# Patient Record
Sex: Male | Born: 1950 | Race: White | Hispanic: No | Smoking: Current every day smoker
Health system: Southern US, Community
[De-identification: ages and names within clinical notes are randomized; demographics above are authoritative.]

## PROBLEM LIST (undated history)

## (undated) ENCOUNTER — Ambulatory Visit: Disposition: A | Discharge status: Home / Self Care | Payer: 59

## (undated) DIAGNOSIS — R7303 Prediabetes: Secondary | ICD-10-CM

## (undated) DIAGNOSIS — E785 Hyperlipidemia, unspecified: Secondary | ICD-10-CM

## (undated) DIAGNOSIS — N400 Enlarged prostate without lower urinary tract symptoms: Secondary | ICD-10-CM

## (undated) DIAGNOSIS — I10 Essential (primary) hypertension: Secondary | ICD-10-CM

## (undated) DIAGNOSIS — J189 Pneumonia, unspecified organism: Secondary | ICD-10-CM

## (undated) DIAGNOSIS — E291 Testicular hypofunction: Secondary | ICD-10-CM

## (undated) DIAGNOSIS — K219 Gastro-esophageal reflux disease without esophagitis: Secondary | ICD-10-CM

## (undated) DIAGNOSIS — I219 Acute myocardial infarction, unspecified: Secondary | ICD-10-CM

## (undated) DIAGNOSIS — R0600 Dyspnea, unspecified: Secondary | ICD-10-CM

## (undated) DIAGNOSIS — M1990 Unspecified osteoarthritis, unspecified site: Secondary | ICD-10-CM

## (undated) DIAGNOSIS — J449 Chronic obstructive pulmonary disease, unspecified: Secondary | ICD-10-CM

## (undated) DIAGNOSIS — E559 Vitamin D deficiency, unspecified: Secondary | ICD-10-CM

## (undated) HISTORY — DX: Benign prostatic hyperplasia without lower urinary tract symptoms: N40.0

## (undated) HISTORY — DX: Testicular hypofunction: E29.1

## (undated) HISTORY — DX: Prediabetes: R73.03

## (undated) HISTORY — DX: Acute myocardial infarction, unspecified: I21.9

## (undated) HISTORY — PX: VASECTOMY: SHX75

## (undated) HISTORY — DX: Chronic obstructive pulmonary disease, unspecified: J44.9

## (undated) HISTORY — DX: Hyperlipidemia, unspecified: E78.5

## (undated) HISTORY — DX: Essential (primary) hypertension: I10

## (undated) HISTORY — PX: PROSTATE SURGERY: SHX751

## (undated) HISTORY — DX: Gastro-esophageal reflux disease without esophagitis: K21.9

## (undated) HISTORY — DX: Vitamin D deficiency, unspecified: E55.9

---

## 1989-05-24 HISTORY — PX: AMPUTATION FINGER / THUMB: SUR24

## 1997-12-25 ENCOUNTER — Ambulatory Visit (HOSPITAL_COMMUNITY): Admission: RE | Admit: 1997-12-25 | Discharge: 1997-12-25 | Payer: Self-pay | Admitting: Urology

## 1999-09-02 ENCOUNTER — Ambulatory Visit (HOSPITAL_COMMUNITY): Admission: RE | Admit: 1999-09-02 | Discharge: 1999-09-02 | Payer: Self-pay | Admitting: Internal Medicine

## 1999-09-02 ENCOUNTER — Encounter: Payer: Self-pay | Admitting: Internal Medicine

## 2000-12-05 ENCOUNTER — Ambulatory Visit (HOSPITAL_COMMUNITY): Admission: RE | Admit: 2000-12-05 | Discharge: 2000-12-05 | Payer: Self-pay | Admitting: Internal Medicine

## 2000-12-05 ENCOUNTER — Encounter: Payer: Self-pay | Admitting: Internal Medicine

## 2001-02-06 ENCOUNTER — Other Ambulatory Visit: Admission: RE | Admit: 2001-02-06 | Discharge: 2001-02-06 | Payer: Self-pay | Admitting: Urology

## 2003-05-25 DIAGNOSIS — I219 Acute myocardial infarction, unspecified: Secondary | ICD-10-CM

## 2003-05-25 HISTORY — PX: CARDIAC CATHETERIZATION: SHX172

## 2003-05-25 HISTORY — DX: Acute myocardial infarction, unspecified: I21.9

## 2003-06-20 ENCOUNTER — Ambulatory Visit (HOSPITAL_COMMUNITY): Admission: RE | Admit: 2003-06-20 | Discharge: 2003-06-20 | Payer: Self-pay | Admitting: Internal Medicine

## 2004-02-13 ENCOUNTER — Ambulatory Visit (HOSPITAL_COMMUNITY): Admission: RE | Admit: 2004-02-13 | Discharge: 2004-02-13 | Payer: Self-pay | Admitting: Internal Medicine

## 2004-05-14 ENCOUNTER — Inpatient Hospital Stay (HOSPITAL_COMMUNITY): Admission: EM | Admit: 2004-05-14 | Discharge: 2004-05-16 | Payer: Self-pay | Admitting: Emergency Medicine

## 2005-07-20 ENCOUNTER — Ambulatory Visit (HOSPITAL_COMMUNITY): Admission: RE | Admit: 2005-07-20 | Discharge: 2005-07-20 | Payer: Self-pay | Admitting: Internal Medicine

## 2006-08-01 ENCOUNTER — Ambulatory Visit (HOSPITAL_COMMUNITY): Admission: RE | Admit: 2006-08-01 | Discharge: 2006-08-01 | Payer: Self-pay | Admitting: Internal Medicine

## 2007-09-06 ENCOUNTER — Ambulatory Visit (HOSPITAL_COMMUNITY): Admission: RE | Admit: 2007-09-06 | Discharge: 2007-09-06 | Payer: Self-pay | Admitting: Internal Medicine

## 2009-08-05 ENCOUNTER — Ambulatory Visit (HOSPITAL_COMMUNITY): Admission: RE | Admit: 2009-08-05 | Discharge: 2009-08-05 | Payer: Self-pay | Admitting: Internal Medicine

## 2009-12-08 ENCOUNTER — Ambulatory Visit (HOSPITAL_COMMUNITY): Admission: RE | Admit: 2009-12-08 | Discharge: 2009-12-08 | Payer: Self-pay | Admitting: Internal Medicine

## 2009-12-10 ENCOUNTER — Ambulatory Visit (HOSPITAL_COMMUNITY): Admission: RE | Admit: 2009-12-10 | Discharge: 2009-12-10 | Payer: Self-pay | Admitting: Internal Medicine

## 2010-10-09 NOTE — Discharge Summary (Signed)
[name], [name] NO.:  [id number]   MEDICAL RECORD NO.:  [id number]          PATIENT TYPE:  INP   LOCATION:  2013                         FACILITY:  [facility]   PHYSICIAN:  Michael [name], M.D.     DATE OF BIRTH:  10-25-1950   DATE OF ADMISSION:  05/14/2004  DATE OF DISCHARGE:  05/16/2004                                 DISCHARGE SUMMARY   ATTENDING PHYSICIAN:  Dr. Tresa [name].   DISCHARGE DIAGNOSES:  1.  Unstable angina pectoris, admitted to rule out myocardial infarction      protocol.  2.  Ruled out for myocardial infarction by serial enzymes and negative EKG      changes.  3.  Newly diagnosed coronary artery disease status post catheterization this      admission without intervention.  4.  Hyperlipidemia with elevated triglyceride level.  5.  Gastroesophageal reflux disease.  6.  Chronic obstructive pulmonary disease, early emphysema with ongoing      tobacco use.   This is a 60 year old Caucasian gentleman, a patient of Dr. Lucky [name]  who presented to the emergency room with complaints of chest pressure and  sensation like a weight sitting on his chest and shortness of breath.  He  denied any history of hypertension, diabetes.  There is no family history of  heart disease.  The patient himself is a smoker and has a history of treated  hyperlipidemia.   His enzymes showed elevated troponins 0.37 and 0.31, but CK and CK MB were  normal, and EKG did not really reveal any significant acute changes.   He was taken to the cath lab on May 14, 2004, by Dr. Tresa [name] and his cath  showed a totally occluded diagonal-2 branch of LAD, ejection fraction was  normal and there was some focal hypokinesis of apex and the anterior wall  secondary to diagonal branch occlusion.  The recommendations were to proceed  with medical therapy.  Patient was transferred to the telemetry unit.  He  tolerated procedure well, did not have any complications and was considered  for  __________ because of his elevated troponin.  He was held in the  hospital for observation and was stable for discharge home on May 16, 2004.  Dr. Allyson [name] assessed the patient in the morning of May 16, 2004,  found him stable.  He was sent home on the following medications:  1.  Aspirin 81 mg daily.  2.  Toprol XL 50 mg daily.  3.  Altace 2.5 mg daily.  4.  Lipitor 40 mg daily.  5.  Plavix 75 mg daily.  6.  Zantac, to resume prior dose.  7.  Nitroglycerin 0.4 mg as needed.  8.  Nitro-Dur patch 25 mg daily 6 weeks then 14 mg daily for 2 weeks and      then 7 mg daily for 2 weeks.   ACTIVITIES:  No change.  No driving, no lifting greater than 3 pounds for  three days.   DISCHARGE DIET:  Low cholesterol, low fat diet.   DISCHARGE FOLLOWUP:  He will see Dr. Tresa [name] on May 28, 2004, at 11:45 and  we are going to give him a note to stay off work up until he sees Dr. Tresa [name]  in the office.       MK/MEDQ  D:  05/16/2004  T:  05/18/2004  Job:  [id number]   cc:   Michael [name], M.D.  [phone] [address]  [place], [place] [address]  Fax: [phone]

## 2010-10-09 NOTE — Cardiovascular Report (Signed)
NAMEELIASAR, HLAVATY NO.:  [id number]   MEDICAL RECORD NO.:  [id number]          PATIENT TYPE:  INP   LOCATION:  2013                         FACILITY:  [facility]   PHYSICIAN:  Nicki Guadalajara, M.D.     DATE OF BIRTH:  Jul 14, 1950   DATE OF PROCEDURE:  05/14/2004  DATE OF DISCHARGE:                              CARDIAC CATHETERIZATION   INDICATIONS:  Michael [name] is a 60 year old gentleman who has a 43-  year history of tobacco use, history of hyperlipidemia, and gastroesophageal  reflux disease symptoms.  Yesterday, he developed new onset exertional  substernal chest pressure.  He developed recurrent episodes of significant  chest pain yesterday which resolved each time with cessation of activity.  He again had recurrent chest pain last evening, at approximately 1 a.m. this  morning was awakened from sleep with severe substernal chest discomfort.  This morning he attempted to go back to work, but again, with minimal  walking developed significant substernal chest pressure compatible with  unstable angina.  He was seen at work and was given nitroglycerin and  aspirin and transferred to [facility] Emergency Room for further evaluation.   His ECG on arrival did not show any acute ST segment elevation.  However, in  light of his worrisome symptoms compatible with accelerated unstable angina,  urgent cardiac catheterization was recommended.   DESCRIPTION OF PROCEDURE:  After premedication with Valium 5 mg  intravenously, the patient was prepped and draped in the usual fashion.  His  right femoral artery was punctured anteriorly and a 6 French sheath was  inserted.  Diagnostic catheterization was done with 6 French Judkins 4 left  and right coronary catheters.  200 mcg of intracoronary nitroglycerin was  administered down the left coronary system.  Pigtail catheter was used for  biplane cine left ventriculography.  Distal aortography was also performed  in light of the  patient's risk factor profile.  After cine angiographic  review, it was felt that medical therapy would be the best way to treat his  occluded small second diagonal vessel and therefore catheters were removed.  Hemostasis was obtained by direct manual pressure.  The patient tolerated  the procedure well.   HEMODYNAMIC DATA:  Central aortic pressure was 140/78, left ventricular  pressure 140/6, post A wave 18.   ANGIOGRAPHIC DATA:  The left main coronary artery was angiographically  normal and bifurcated into an LAD and left circumflex system.   The LAD gave rise to a moderate size first diagonal vessel and moderate size  first septal perforating artery.  In the proximal to mid segment there was a  diminutive size second diagonal vessel which appeared totally occluded.  There was faint filling of a very small vessel in a retrograde fashion.  The  third diagonal vessel was very small.  A portion of the LAD after the third  diagonal vessel seemed to dip intramyocardially, but there was not any  significant muscle bridging or apparent spasm.  The remainder of the LAD was  angiographically normal and wrapped around the LV apex.   The circumflex vessel gave rise to  two marginal vessels and was free of  significant disease.   The right coronary artery was angiographically normal and gave rise to a PDA  and inferior LV branch, and ended in a small posterior lateral system.   Biplane cine left ventriculography revealed normal global LV contractility  with ejection fraction of at least 55-60%.  However, there was a very small  focal region of upper mid anterolateral hypocontractility seen on the RAO  projection.  On the LAO projection, contractility was normal.   Distal aortography did not demonstrate any renal artery stenosis.  There was  no significant aortoiliac disease.   IMPRESSION:  1.  Normal global left ventricular function with subtle focal, mild upper      mid anterolateral  hypocontractility.  2.  Total occlusion of a very small second diagonal branch of the left      anterior descending.  3.  Normal circumflex and normal right coronary arteries.   RECOMMENDATIONS:  Mr. Omdahl yesterday developed classic symptoms of  accelerated new onset angina pectoris/unstable angina.  Catheterization  today shows total occlusion of a very small second diagonal branch of his  LAD which is 1.5 to less than 2 mm in size.  There appeared to be very faint  filling of a very small mid portion of the diagonal vessel.  Medical therapy  will be recommended.  Smoking cessation will be strongly encouraged.  The  patient will be started on antiplatelet therapy in addition to aggressive  lipid lowering, beta blockade, and probable ACE inhibition.       TK/MEDQ  D:  05/14/2004  T:  05/15/2004  Job:  [id number]   cc:   Kirk Ruths, M.D.  [address]  [place]  [place] [address]  Fax: [phone]   Orville Govern

## 2012-03-23 ENCOUNTER — Other Ambulatory Visit: Payer: Self-pay | Admitting: Internal Medicine

## 2012-03-23 DIAGNOSIS — R911 Solitary pulmonary nodule: Secondary | ICD-10-CM

## 2012-03-29 ENCOUNTER — Ambulatory Visit
Admission: RE | Admit: 2012-03-29 | Discharge: 2012-03-29 | Disposition: A | Discharge status: Home / Self Care | Payer: 59 | Source: Ambulatory Visit | Attending: Internal Medicine | Admitting: Internal Medicine

## 2012-03-29 DIAGNOSIS — R911 Solitary pulmonary nodule: Secondary | ICD-10-CM

## 2012-03-29 MED ORDER — IOHEXOL 300 MG/ML  SOLN
75.0000 mL | Freq: Once | INTRAMUSCULAR | Status: AC | PRN
  Administered 2012-03-29: 75 mL via INTRAVENOUS

## 2012-10-22 HISTORY — PX: OTHER SURGICAL HISTORY: SHX169

## 2013-03-09 ENCOUNTER — Encounter: Payer: Self-pay | Admitting: Internal Medicine

## 2013-03-09 DIAGNOSIS — E559 Vitamin D deficiency, unspecified: Secondary | ICD-10-CM | POA: Insufficient documentation

## 2013-03-09 DIAGNOSIS — N138 Other obstructive and reflux uropathy: Secondary | ICD-10-CM | POA: Insufficient documentation

## 2013-03-09 DIAGNOSIS — J449 Chronic obstructive pulmonary disease, unspecified: Secondary | ICD-10-CM | POA: Insufficient documentation

## 2013-03-09 DIAGNOSIS — I10 Essential (primary) hypertension: Secondary | ICD-10-CM | POA: Insufficient documentation

## 2013-03-09 DIAGNOSIS — E782 Mixed hyperlipidemia: Secondary | ICD-10-CM | POA: Insufficient documentation

## 2013-03-28 ENCOUNTER — Ambulatory Visit: Payer: Self-pay | Admitting: Physician Assistant

## 2013-04-06 ENCOUNTER — Encounter: Payer: Self-pay | Admitting: Physician Assistant

## 2013-04-06 ENCOUNTER — Ambulatory Visit: Payer: 59 | Admitting: Physician Assistant

## 2013-04-06 VITALS — BP 120/70 | HR 60 | Temp 98.1°F | Resp 16 | Ht 72.25 in | Wt 164.0 lb

## 2013-04-06 DIAGNOSIS — I10 Essential (primary) hypertension: Secondary | ICD-10-CM

## 2013-04-06 DIAGNOSIS — Z79899 Other long term (current) drug therapy: Secondary | ICD-10-CM

## 2013-04-06 DIAGNOSIS — E559 Vitamin D deficiency, unspecified: Secondary | ICD-10-CM

## 2013-04-06 DIAGNOSIS — E785 Hyperlipidemia, unspecified: Secondary | ICD-10-CM

## 2013-04-06 LAB — CBC WITH DIFFERENTIAL/PLATELET
Basophils Absolute: 0.1 10*3/uL (ref 0.0–0.1)
Lymphocytes Relative: 29 % (ref 12–46)
Lymphs Abs: 2.7 10*3/uL (ref 0.7–4.0)
MCHC: 34.3 g/dL (ref 30.0–36.0)
Neutro Abs: 5.8 10*3/uL (ref 1.7–7.7)
Neutrophils Relative %: 61 % (ref 43–77)
RDW: 12.9 % (ref 11.5–15.5)

## 2013-04-06 LAB — LIPID PANEL
Cholesterol: 234 mg/dL — ABNORMAL HIGH (ref 0–200)
HDL: 48 mg/dL (ref >39)
LDL Cholesterol: 144 mg/dL — ABNORMAL HIGH (ref 0–99)
Triglycerides: 211 mg/dL — ABNORMAL HIGH (ref <150)
VLDL: 42 mg/dL — ABNORMAL HIGH (ref 0–40)

## 2013-04-06 LAB — HEPATIC FUNCTION PANEL
ALT: 16 U/L (ref 0–53)
Alkaline Phosphatase: 82 U/L (ref 39–117)
Total Bilirubin: 0.9 mg/dL (ref 0.3–1.2)

## 2013-04-06 LAB — BASIC METABOLIC PANEL WITH GFR
CO2: 29 mEq/L (ref 19–32)
Calcium: 9.7 mg/dL (ref 8.4–10.5)
Chloride: 102 mEq/L (ref 96–112)
GFR, Est Non African American: >89 mL/min
Sodium: 137 mEq/L (ref 135–145)

## 2013-04-06 LAB — TSH: TSH: 2.216 u[IU]/mL (ref 0.350–4.500)

## 2013-04-06 LAB — MAGNESIUM: Magnesium: 2.1 mg/dL (ref 1.5–2.5)

## 2013-04-06 NOTE — Progress Notes (Signed)
HPI Patient presents for 3 month follow up with hypertension, hyperlipidemia, prediabetes and vitamin D. Patient's blood pressure has been controlled at home. Patient denies chest pain, shortness of breath, dizziness.  Patient's cholesterol is diet controlled. The cholesterol last visit was  LDL 130 and with heart history goal is 70, patient did not start the crestor he was given last visit. Pending these labs he has agreed to start it.  Patient is on Vitamin D supplement.  Current Medications:       bisoprolol-hydrochlorothiazide 10-6.25 MG per tablet  Commonly known as:  ZIAC  Take 0.5 tablets by mouth daily.     cholecalciferol 1000 UNITS tablet  Commonly known as:  VITAMIN D  1,000 Units. 4000 total a day     DULERA 100-5 MCG/ACT Aero  Generic drug:  mometasone-formoterol  Inhale 2 puffs into the lungs 2 (two) times daily.     saw palmetto 500 MG capsule  Take 500 mg by mouth daily.       Medical History:  Past Medical History  Diagnosis Date  . GERD (gastroesophageal reflux disease)   . BPH (benign prostatic hyperplasia)   . Vitamin D deficiency   . Prediabetes   . Hypogonadism male   . COPD (chronic obstructive pulmonary disease)     via CT lung  . Hypertension   . Hyperlipidemia   . Myocardial infarction 2005    Per patient no stent or PTCA, just medical treatment   Allergies:  Allergies  Allergen Reactions  . Ace Inhibitors Cough  . Ciprofloxacin   . Fenofibrate     dizzy    ROS Constitutional: Denies fever, chills, weight loss/gain, headaches, insomnia, fatigue, night sweats, and change in appetite. Eyes: Denies redness, blurred vision, diplopia, discharge, itchy, watery eyes.  ENT: Denies discharge, congestion, post nasal drip, sore throat, earache, dental pain, Tinnitus, Vertigo, Sinus pain, snoring.  Cardio: Denies chest pain, palpitations, irregular heartbeat,  dyspnea, diaphoresis, orthopnea, PND, claudication, edema Respiratory: denies cough,  dyspnea,pleurisy, hoarseness, wheezing.  Gastrointestinal: Denies dysphagia, heartburn,  water brash, pain, cramps, nausea, vomiting, bloating, diarrhea, constipation, hematemesis, melena, hematochezia,  hemorrhoids Genitourinary: Denies dysuria, frequency, urgency, nocturia, hesitancy, discharge, hematuria, flank pain Musculoskeletal: Denies arthralgia, myalgia, stiffness, Jt. Swelling, pain, limp, and strain/sprain. Skin: Denies puritis, rash, hives, warts, acne, eczema, changing in skin lesion Neuro: Weakness, tremor, incoordination, spasms, paresthesia, pain Psychiatric: Denies confusion, memory loss, sensory loss Endocrine: Denies change in weight, skin, hair change, nocturia, and paresthesia, Diabetic Polys, visual blurring, hyper /hypo glycemic episodes.  Heme/Lymph: Excessive bleeding, bruising, enlarged lymph nodes  Family history- Review and unchanged Social history- Review and unchanged Physical Exam: Filed Vitals:   04/06/13 0850  BP: 120/70  Pulse: 60  Temp: 98.1 F (36.7 C)  Resp: 16   Filed Weights   04/06/13 0850  Weight: 164 lb (74.39 kg)   General Appearance: Well nourished, in no apparent distress. Eyes: PERRLA, EOMs, conjunctiva no swelling or erythema, normal fundi and vessels. Sinuses: No Frontal/maxillary tenderness ENT/Mouth: Ext aud canals clear, with TMs without erythema, bulging.No erythema, swelling, or exudate on post pharynx.  Tonsils not swollen or erythematous. Hearing normal.  Neck: Supple, thyroid normal.  Respiratory: Respiratory effort normal, BS equal bilaterally without rales, rhonci, wheezing or stridor.  Cardio: + 2/6 holosytolic murmur without rubs or gallops. Peripheral pulses brisk and equal bilaterally, without edema.  Abdomen: Flat, soft, with bowel sounds. Nontender, no guarding, rebound, hernias, masses, or organomegaly.  Lymphatics: Non tender without lymphadenopathy.  Musculoskeletal: Full  ROM all peripheral extremities, joint  stability, 5/5 strength, and normal gait. Skin: Warm, dry without rashes, lesions, ecchymosis.  Neuro: Cranial nerves intact, reflexes equal bilaterally. Normal muscle tone, no cerebellar symptoms. Sensation intact.  Pysch: Awake and oriented X 3, normal affect, Insight and Judgment appropriate.   Assessement and Plan:  Hypertension: Continue medication, monitor blood pressure at home. Continue DASH diet. Cholesterol: Continue diet and exercise. Check cholesterol and explained with his history of a stent our goal is 70. He will start the crestor, 1/4 pill 3 days a week.  Vitamin D Def- check level and continue medications.   Quentin Mulling 9:31 AM

## 2013-04-06 NOTE — Patient Instructions (Signed)

## 2013-04-07 LAB — VITAMIN D 25 HYDROXY (VIT D DEFICIENCY, FRACTURES): Vit D, 25-Hydroxy: 70 ng/mL (ref 30–89)

## 2013-06-29 ENCOUNTER — Ambulatory Visit (INDEPENDENT_AMBULATORY_CARE_PROVIDER_SITE_OTHER): Payer: 59 | Admitting: Internal Medicine

## 2013-06-29 ENCOUNTER — Encounter: Payer: Self-pay | Admitting: Internal Medicine

## 2013-06-29 VITALS — BP 132/72 | HR 60 | Temp 97.7°F | Resp 16 | Wt 160.4 lb

## 2013-06-29 DIAGNOSIS — R7309 Other abnormal glucose: Secondary | ICD-10-CM | POA: Insufficient documentation

## 2013-06-29 DIAGNOSIS — E785 Hyperlipidemia, unspecified: Secondary | ICD-10-CM

## 2013-06-29 DIAGNOSIS — Z79899 Other long term (current) drug therapy: Secondary | ICD-10-CM

## 2013-06-29 DIAGNOSIS — E559 Vitamin D deficiency, unspecified: Secondary | ICD-10-CM

## 2013-06-29 DIAGNOSIS — I10 Essential (primary) hypertension: Secondary | ICD-10-CM

## 2013-06-29 LAB — CBC WITH DIFFERENTIAL/PLATELET
BASOS ABS: 0.1 10*3/uL (ref 0.0–0.1)
Basophils Relative: 1 % (ref 0–1)
EOS PCT: 1 % (ref 0–5)
Eosinophils Absolute: 0.1 10*3/uL (ref 0.0–0.7)
HCT: 41.3 % (ref 39.0–52.0)
HEMOGLOBIN: 14.4 g/dL (ref 13.0–17.0)
LYMPHS ABS: 2.4 10*3/uL (ref 0.7–4.0)
Lymphocytes Relative: 29 % (ref 12–46)
MCH: 31.9 pg (ref 26.0–34.0)
MCHC: 34.9 g/dL (ref 30.0–36.0)
MCV: 91.6 fL (ref 78.0–100.0)
MONO ABS: 0.8 10*3/uL (ref 0.1–1.0)
Monocytes Relative: 9 % (ref 3–12)
NEUTROS ABS: 5 10*3/uL (ref 1.7–7.7)
Neutrophils Relative %: 60 % (ref 43–77)
PLATELETS: 119 10*3/uL — AB (ref 150–400)
RBC: 4.51 MIL/uL (ref 4.22–5.81)
RDW: 13.6 % (ref 11.5–15.5)
WBC: 8.4 10*3/uL (ref 4.0–10.5)

## 2013-06-29 LAB — HEPATIC FUNCTION PANEL
ALT: 19 U/L (ref 0–53)
AST: 28 U/L (ref 0–37)
Albumin: 3.9 g/dL (ref 3.5–5.2)
Alkaline Phosphatase: 82 U/L (ref 39–117)
BILIRUBIN DIRECT: 0.1 mg/dL (ref 0.0–0.3)
BILIRUBIN TOTAL: 1 mg/dL (ref 0.2–1.2)
Indirect Bilirubin: 0.9 mg/dL (ref 0.2–1.2)
TOTAL PROTEIN: 6.2 g/dL (ref 6.0–8.3)

## 2013-06-29 LAB — BASIC METABOLIC PANEL WITH GFR
BUN: 14 mg/dL (ref 6–23)
CHLORIDE: 104 meq/L (ref 96–112)
CO2: 30 mEq/L (ref 19–32)
CREATININE: 0.91 mg/dL (ref 0.50–1.35)
Calcium: 9.5 mg/dL (ref 8.4–10.5)
GFR, Est Non African American: 89 mL/min
Glucose, Bld: 92 mg/dL (ref 70–99)
POTASSIUM: 4.8 meq/L (ref 3.5–5.3)
Sodium: 141 mEq/L (ref 135–145)

## 2013-06-29 LAB — LIPID PANEL
CHOLESTEROL: 233 mg/dL — AB (ref 0–200)
HDL: 47 mg/dL (ref >39)
LDL CALC: 145 mg/dL — AB (ref 0–99)
Total CHOL/HDL Ratio: 5 Ratio
Triglycerides: 206 mg/dL — ABNORMAL HIGH (ref <150)
VLDL: 41 mg/dL — ABNORMAL HIGH (ref 0–40)

## 2013-06-29 LAB — MAGNESIUM: Magnesium: 1.8 mg/dL (ref 1.5–2.5)

## 2013-06-29 LAB — TSH: TSH: 1.52 u[IU]/mL (ref 0.350–4.500)

## 2013-06-29 NOTE — Progress Notes (Signed)
Patient ID: Michael [name], male   DOB: 1950-08-23, 63 y.o.   MRN: [id number]   This very nice 63 y.o. MWM presents for 3 month follow up with Hypertension, ASHD, Hyperlipidemia, Pre-Diabetes and Vitamin D Deficiency.    HTN predates since 2004 and he presented with AMI in 2005 and cath revealed only a small branch occlusion of the ckirc. Other major branches were surprisingly clear and only medical therapy was recommended.  BP has been controlled at home. Today's BP: 132/72 mmHg . Patient denies any cardiac type chest pain, palpitations, dyspnea/orthopnea/PND, dizziness, claudication, or dependent edema.   Hyperlipidemia is controlled with diet & meds. Last Cholesterol was  256, Triglycerides were 366, HDL 43 and LDL 130 - not at goal with patient somewhat reluctant to take cholesterol meds given his cath findings although he was reminded that was over 10 years ago.. Patient denies myalgias or other med SE's.    Also, the patient has history of PreDiabetes with A1c 5.7% in 2010 and with last A1c of 5.3% in Aug 2014. Patient denies any symptoms of reactive hypoglycemia, diabetic polys, paresthesias or visual blurring.   Further, Patient has history of Vitamin D Deficiency of 25 in 2009 with last vitamin D of 74 in Aug 2014. Patient supplements vitamin D without any suspected side-effects.    Medication List         bisoprolol-hydrochlorothiazide 10-6.25 MG per tablet  Commonly known as:  ZIAC  Take 0.5 tablets by mouth daily.     cholecalciferol 1000 UNITS tablet  Commonly known as:  VITAMIN D  1,000 Units. 4000 total a day     DULERA 100-5 MCG/ACT Aero  Generic drug:  mometasone-formoterol  Inhale 2 puffs into the lungs 2 (two) times daily.     multivitamin tablet  Take 1 tablet by mouth daily. Takes a vitamin pack daily     saw palmetto 500 MG capsule  Take 500 mg by mouth daily.         Allergies  Allergen Reactions  . Ace Inhibitors Cough  . Ciprofloxacin   . Fenofibrate      dizzy    PMHx:   Past Medical History  Diagnosis Date  . GERD (gastroesophageal reflux disease)   . BPH (benign prostatic hyperplasia)   . Vitamin D deficiency   . Prediabetes   . Hypogonadism male   . COPD (chronic obstructive pulmonary disease)     via CT lung  . Hypertension   . Hyperlipidemia   . Myocardial infarction 2005    Per patient no stent or PTCA, just medical treatment    FHx:    Reviewed / unchanged  SHx:    Reviewed / unchanged  Systems Review: Constitutional: Denies fever, chills, wt changes, headaches, insomnia, fatigue, night sweats, change in appetite. Eyes: Denies redness, blurred vision, diplopia, discharge, itchy, watery eyes.  ENT: Denies discharge, congestion, post nasal drip, epistaxis, sore throat, earache, hearing loss, dental pain, tinnitus, vertigo, sinus pain, snoring.  CV: Denies chest pain, palpitations, irregular heartbeat, syncope, dyspnea, diaphoresis, orthopnea, PND, claudication, edema. Respiratory: denies cough, dyspnea, DOE, pleurisy, hoarseness, laryngitis, wheezing.  Gastrointestinal: Denies dysphagia, odynophagia, heartburn, reflux, water brash, abdominal pain or cramps, nausea, vomiting, bloating, diarrhea, constipation, hematemesis, melena, hematochezia,  or hemorrhoids. Genitourinary: Denies dysuria, frequency, urgency, nocturia, hesitancy, discharge, hematuria, flank pain. Musculoskeletal: Denies arthralgias, myalgias, stiffness, jt. swelling, pain, limp, strain/sprain.  Skin: Denies pruritus, rash, hives, warts, acne, eczema, change in skin lesion(s). Neuro: No weakness, tremor, incoordination, spasms,  paresthesia, or pain. Psychiatric: Denies confusion, memory loss, or sensory loss. Endo: Denies change in weight, skin, hair change.  Heme/Lymph: No excessive bleeding, bruising, orenlarged lymph nodes.  BP: 132/72  Pulse: 60  Temp: 97.7 F (36.5 C)  Resp: 16    Estimated body mass index is 21.61 kg/(m^2) as calculated  from the following:   Height as of 04/06/13: 6' 0.25" (1.835 m).   Weight as of this encounter: 160 lb 6.4 oz (72.757 kg).  On Exam: Appears well nourished - in no distress. Eyes: PERRLA, EOMs, conjunctiva no swelling or erythema. Sinuses: No frontal/maxillary tenderness ENT/Mouth: EAC's clear, TM's nl w/o erythema, bulging. Nares clear w/o erythema, swelling, exudates. Oropharynx clear without erythema or exudates. Oral hygiene is good. Tongue normal, non obstructing. Hearing intact.  Neck: Supple. Thyroid nl. Car 2+/2+ without bruits, nodes or JVD. Chest: Respirations nl with BS clear & equal w/o rales, rhonchi, wheezing or stridor.  Cor: Heart sounds normal w/ regular rate and rhythm without sig. murmurs, gallops, clicks, or rubs. Peripheral pulses normal and equal  without edema.  Abdomen: Soft & bowel sounds normal. Non-tender w/o guarding, rebound, hernias, masses, or organomegaly.  Lymphatics: Unremarkable.  Musculoskeletal: Full ROM all peripheral extremities, joint stability, 5/5 strength, and normal gait.  Skin: Warm, dry without exposed rashes, lesions, ecchymosis apparent.  Neuro: Cranial nerves intact, reflexes equal bilaterally. Sensory-motor testing grossly intact. Tendon reflexes grossly intact.  Pysch: Alert & oriented x 3. Insight and judgement nl & appropriate. No ideations.  Assessment and Plan:  1. Hypertension / ASCAD -  Continue monitor blood pressure at home. Continue diet/meds same.  2. Hyperlipidemia - Continue diet and encouraged to take meds to lower cholesterol, exercise,& lifestyle modifications. Continue monitor periodic cholesterol/liver & renal functions   3. Pre-diabetes - Continue diet, exercise, lifestyle modifications. Monitor appropriate labs.  4. Vitamin D Deficiency - Continue supplementation.  Recommended regular exercise, BP monitoring, weight control, and discussed med and SE's. Recommended labs to assess and monitor clinical status. Further  disposition pending results of labs.

## 2013-06-29 NOTE — Patient Instructions (Signed)

## 2013-06-30 LAB — INSULIN, FASTING: Insulin fasting, serum: 3 u[IU]/mL (ref 3–28)

## 2013-06-30 LAB — HEMOGLOBIN A1C
Hgb A1c MFr Bld: 5.8 % — ABNORMAL HIGH (ref <5.7)
Mean Plasma Glucose: 120 mg/dL — ABNORMAL HIGH (ref <117)

## 2013-06-30 LAB — VITAMIN D 25 HYDROXY (VIT D DEFICIENCY, FRACTURES): Vit D, 25-Hydroxy: 73 ng/mL (ref 30–89)

## 2013-12-25 ENCOUNTER — Ambulatory Visit (INDEPENDENT_AMBULATORY_CARE_PROVIDER_SITE_OTHER): Payer: 59 | Admitting: Internal Medicine

## 2013-12-25 ENCOUNTER — Ambulatory Visit (HOSPITAL_COMMUNITY)
Admission: RE | Admit: 2013-12-25 | Discharge: 2013-12-25 | Disposition: A | Discharge status: Home / Self Care | Payer: 59 | Source: Ambulatory Visit | Attending: Internal Medicine | Admitting: Internal Medicine

## 2013-12-25 ENCOUNTER — Encounter: Payer: Self-pay | Admitting: Internal Medicine

## 2013-12-25 VITALS — BP 118/74 | HR 60 | Temp 97.5°F | Resp 16 | Ht 72.0 in | Wt 152.2 lb

## 2013-12-25 DIAGNOSIS — F17200 Nicotine dependence, unspecified, uncomplicated: Secondary | ICD-10-CM | POA: Insufficient documentation

## 2013-12-25 DIAGNOSIS — J4489 Other specified chronic obstructive pulmonary disease: Secondary | ICD-10-CM | POA: Insufficient documentation

## 2013-12-25 DIAGNOSIS — J449 Chronic obstructive pulmonary disease, unspecified: Secondary | ICD-10-CM | POA: Diagnosis not present

## 2013-12-25 DIAGNOSIS — Z1212 Encounter for screening for malignant neoplasm of rectum: Secondary | ICD-10-CM

## 2013-12-25 DIAGNOSIS — Z111 Encounter for screening for respiratory tuberculosis: Secondary | ICD-10-CM

## 2013-12-25 DIAGNOSIS — R7402 Elevation of levels of lactic acid dehydrogenase (LDH): Secondary | ICD-10-CM

## 2013-12-25 DIAGNOSIS — E559 Vitamin D deficiency, unspecified: Secondary | ICD-10-CM

## 2013-12-25 DIAGNOSIS — J8410 Pulmonary fibrosis, unspecified: Secondary | ICD-10-CM | POA: Diagnosis not present

## 2013-12-25 DIAGNOSIS — Z125 Encounter for screening for malignant neoplasm of prostate: Secondary | ICD-10-CM

## 2013-12-25 DIAGNOSIS — R740 Nonspecific elevation of levels of transaminase and lactic acid dehydrogenase [LDH]: Secondary | ICD-10-CM

## 2013-12-25 DIAGNOSIS — I10 Essential (primary) hypertension: Secondary | ICD-10-CM

## 2013-12-25 DIAGNOSIS — Z0000 Encounter for general adult medical examination without abnormal findings: Secondary | ICD-10-CM

## 2013-12-25 DIAGNOSIS — Z113 Encounter for screening for infections with a predominantly sexual mode of transmission: Secondary | ICD-10-CM

## 2013-12-25 LAB — CBC WITH DIFFERENTIAL/PLATELET
Basophils Absolute: 0.1 10*3/uL (ref 0.0–0.1)
Basophils Relative: 1 % (ref 0–1)
EOS ABS: 0.2 10*3/uL (ref 0.0–0.7)
EOS PCT: 2 % (ref 0–5)
HEMATOCRIT: 41.5 % (ref 39.0–52.0)
Hemoglobin: 14.6 g/dL (ref 13.0–17.0)
Lymphocytes Relative: 25 % (ref 12–46)
Lymphs Abs: 2.2 10*3/uL (ref 0.7–4.0)
MCH: 33 pg (ref 26.0–34.0)
MCHC: 35.2 g/dL (ref 30.0–36.0)
MCV: 93.9 fL (ref 78.0–100.0)
MONO ABS: 0.7 10*3/uL (ref 0.1–1.0)
Monocytes Relative: 8 % (ref 3–12)
Neutro Abs: 5.7 10*3/uL (ref 1.7–7.7)
Neutrophils Relative %: 64 % (ref 43–77)
PLATELETS: 111 10*3/uL — AB (ref 150–400)
RBC: 4.42 MIL/uL (ref 4.22–5.81)
RDW: 13.4 % (ref 11.5–15.5)
WBC: 8.9 10*3/uL (ref 4.0–10.5)

## 2013-12-25 LAB — HEMOGLOBIN A1C
Hgb A1c MFr Bld: 5.5 % (ref <5.7)
MEAN PLASMA GLUCOSE: 111 mg/dL (ref <117)

## 2013-12-25 NOTE — Patient Instructions (Signed)

## 2013-12-25 NOTE — Progress Notes (Signed)
Patient ID: Liana Crocker, male   DOB: 1950-08-02, 63 y.o.   MRN: [id number]   Annual Screening Comprehensive Examination  This very nice 63 y.o.recently Sep WM presents for complete physical.  Patient has been followed for HTN, ASCAD/s/p PTCA,  Prediabetes, Hyperlipidemia and Vitamin D Deficiency.   HTN predates since 2004. Patient's BP has been controlled at home.Today's BP: 118/74 mmHg. In 2005 patient had an acute MI and underwent PTCA/Stenting. In 2007 he had a negative Cardiolite and a negative ETT in 2010. Patient since has done well and denies any cardiac symptoms as chest pain, palpitations, shortness of breath, dizziness or ankle swelling.   Patient's hyperlipidemia is controlled with diet and medications, altho he reports being off of his crestor for about 1 year due to leg cramps. Patient denies myalgias or other medication SE's. Last lipids were not at goal -  06/29/2013: Cholesterol, Total 233*; HDL Cholesterol by NMR 47; LDL (calc) 145*; Triglycerides 206*   Patient has prediabetes since Dec 2010 (A1c 5.7%) and patient denies reactive hypoglycemic symptoms, visual blurring, diabetic polys or paresthesias. His last A1c was .    Finally, patient has history of Vitamin D Deficiency of 25 in 2009 and his last vitamin D was 06/29/2013: Vit D, 25-Hydroxy 73 .  Medication Sig  . bisoprolol-hctz 10-6.25 MG per tablet Take 0.5 tablets by mouth daily.  Marland Kitchen VITAMIN D 1000 UNITS tablet  4000 total a day  . DULERA 100-5  Inhale 2 puffs into the lungs 2  times daily.  . MULTIVITAMIN Take 1 tablet by mouth daily  . saw palmetto 500 MG capsule Take 500 mg by mouth daily.   Allergies  Allergen Reactions  . Ace Inhibitors Cough  . Ciprofloxacin   . Fenofibrate     dizzy   Past Medical History  Diagnosis Date  . GERD (gastroesophageal reflux disease)   . BPH (benign prostatic hyperplasia)   . Vitamin D deficiency   . Prediabetes   . Hypogonadism male   . COPD (chronic obstructive  pulmonary disease)     via CT lung  . Hypertension   . Hyperlipidemia   . Myocardial infarction 2005    Per patient no stent or PTCA, just medical treatment   Past Surgical History  Procedure Laterality Date  . Coronary angioplasty with stent placement N/A 2005    Per patient no stent or PTCA, just medical treatment  . Prostate surgery      Biopsy X 3  . Amputation finger / thumb Left 1991    distal index  . Vasectomy     Family History  Problem Relation Age of Onset  . Cancer Father 58    lymphoma  . Cancer Maternal Aunt     Lung  . Cancer Maternal Uncle     Lung  . Cancer Maternal Uncle     Lung   History   Social History  . Marital Status: Married    Spouse Name: N/A    Number of Children: N/A  . Years of Education: N/A   Occupational History  . 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 Smoking status: Current Every Day Smoker -- 1.00 packs/day for 36 years    Types: Cigarettes  . Smokeless tobacco: Never Used  . Alcohol Use: 3.0 oz/week    5 Cans of beer per week  . Drug Use: No  . Sexual Activity: Not on file    ROS Constitutional: Denies fever, chills, weight  loss/gain, headaches, insomnia, fatigue, night sweats or change in appetite. Eyes: Denies redness, blurred vision, diplopia, discharge, itchy or watery eyes.  ENT: Denies discharge, congestion, post nasal drip, epistaxis, sore throat, earache, hearing loss, dental pain, Tinnitus, Vertigo, Sinus pain or snoring.  Cardio: Denies chest pain, palpitations, irregular heartbeat, syncope, dyspnea, diaphoresis, orthopnea, PND, claudication or edema Respiratory: denies cough, dyspnea, DOE, pleurisy, hoarseness, laryngitis or wheezing.  Gastrointestinal: Denies dysphagia, heartburn, reflux, water brash, pain, cramps, nausea, vomiting, bloating, diarrhea, constipation, hematemesis, melena, hematochezia, jaundice or hemorrhoids Genitourinary: Denies dysuria, frequency, urgency, nocturia, hesitancy,  discharge, hematuria or flank pain Musculoskeletal: Denies arthralgia, myalgia, stiffness, Jt. Swelling, pain, limp or strain/sprain. Denies Falls. Skin: Denies puritis, rash, hives, warts, acne, eczema or change in skin lesion Neuro: No weakness, tremor, incoordination, spasms, paresthesia or pain Psychiatric: Denies confusion, memory loss or sensory loss. Denies Depression. Endocrine: Denies change in weight, skin, hair change, nocturia, and paresthesia, diabetic polys, visual blurring or hyper / hypo glycemic episodes.  Heme/Lymph: No excessive bleeding, bruising or enlarged lymph nodes.  Physical Exam  BP 118/74  Pulse 60  Temp(Src) 97.5 F (36.4 C) (Temporal)  Resp 16  Ht 6' (1.829 m)  Wt 152 lb 3.2 oz (69.037 kg)  BMI 20.64 kg/m2  General Appearance: Well nourished, in no apparent distress. Eyes: PERRLA, EOMs, conjunctiva no swelling or erythema, normal fundi and vessels. Sinuses: No frontal/maxillary tenderness ENT/Mouth: EACs patent / TMs  nl. Nares clear without erythema, swelling, mucoid exudates. Oral hygiene is good. No erythema, swelling, or exudate. Tongue normal, non-obstructing. Tonsils not swollen or erythematous. Hearing normal.  Neck: Supple, thyroid normal. No bruits, nodes or JVD. Respiratory: Respiratory effort normal.  BS equal and clear bilateral without rales, rhonci, wheezing or stridor. Cardio: Heart sounds are normal with regular rate and rhythm and no murmurs, rubs or gallops. Peripheral pulses are normal and equal bilaterally without edema. No aortic or femoral bruits. Chest: symmetric with normal excursions and percussion.  Abdomen: Flat, soft, with bowl sounds. Nontender, no guarding, rebound, hernias, masses, or organomegaly.  Lymphatics: Non tender without lymphadenopathy.  Genitourinary: No hernias.Testes nl. DRE - prostate nl for age - smooth & firm w/o nodules. Musculoskeletal: Full ROM all peripheral extremities, joint stability, 5/5 strength, and  normal gait. Skin: Warm and dry without rashes, lesions, cyanosis, clubbing or  ecchymosis.  Neuro: Cranial nerves intact, reflexes equal bilaterally. Normal muscle tone, no cerebellar symptoms. Sensation intact.  Pysch: Awake and oriented X 3with normal affect, insight and judgment appropriate.  Assessment and Plan  1. Annual Screening Examination 2. Hypertension  3. Hyperlipidemia 4. Pre Diabetes 5. Vitamin D Deficiency 6. ASCAD s/p PTCA 7. BPH  Continue prudent diet as discussed, weight control, BP monitoring, regular exercise, and medications as discussed.  Discussed med effects and SE's. Routine screening labs and tests as requested with regular follow-up as recommended.

## 2013-12-26 LAB — BASIC METABOLIC PANEL WITH GFR
BUN: 20 mg/dL (ref 6–23)
CALCIUM: 9.5 mg/dL (ref 8.4–10.5)
CHLORIDE: 107 meq/L (ref 96–112)
CO2: 29 mEq/L (ref 19–32)
CREATININE: 0.91 mg/dL (ref 0.50–1.35)
GFR, Est African American: >89 mL/min
GFR, Est Non African American: 89 mL/min
Glucose, Bld: 102 mg/dL — ABNORMAL HIGH (ref 70–99)
Potassium: 4.9 mEq/L (ref 3.5–5.3)
Sodium: 143 mEq/L (ref 135–145)

## 2013-12-26 LAB — URINALYSIS, MICROSCOPIC ONLY
BACTERIA UA: NONE SEEN
CRYSTALS: NONE SEEN
Casts: NONE SEEN
SQUAMOUS EPITHELIAL / LPF: NONE SEEN

## 2013-12-26 LAB — HEPATIC FUNCTION PANEL
ALBUMIN: 4.3 g/dL (ref 3.5–5.2)
ALK PHOS: 76 U/L (ref 39–117)
ALT: 12 U/L (ref 0–53)
AST: 19 U/L (ref 0–37)
BILIRUBIN TOTAL: 1.3 mg/dL — AB (ref 0.2–1.2)
Bilirubin, Direct: 0.2 mg/dL (ref 0.0–0.3)
Indirect Bilirubin: 1.1 mg/dL (ref 0.2–1.2)
TOTAL PROTEIN: 6.3 g/dL (ref 6.0–8.3)

## 2013-12-26 LAB — PSA: PSA: 8.04 ng/mL — ABNORMAL HIGH (ref <=4.00)

## 2013-12-26 LAB — HEPATITIS B CORE ANTIBODY, TOTAL: HEP B C TOTAL AB: NONREACTIVE

## 2013-12-26 LAB — LIPID PANEL
Cholesterol: 214 mg/dL — ABNORMAL HIGH (ref 0–200)
HDL: 62 mg/dL (ref >39)
LDL CALC: 135 mg/dL — AB (ref 0–99)
Total CHOL/HDL Ratio: 3.5 Ratio
Triglycerides: 87 mg/dL (ref <150)
VLDL: 17 mg/dL (ref 0–40)

## 2013-12-26 LAB — RPR

## 2013-12-26 LAB — HIV ANTIBODY (ROUTINE TESTING W REFLEX): HIV 1&2 Ab, 4th Generation: NONREACTIVE

## 2013-12-26 LAB — MAGNESIUM: MAGNESIUM: 2.2 mg/dL (ref 1.5–2.5)

## 2013-12-26 LAB — TESTOSTERONE: Testosterone: 277 ng/dL — ABNORMAL LOW (ref 300–890)

## 2013-12-26 LAB — HEPATITIS A ANTIBODY, TOTAL: Hep A Total Ab: NONREACTIVE

## 2013-12-26 LAB — MICROALBUMIN / CREATININE URINE RATIO
Creatinine, Urine: 97.5 mg/dL
MICROALB UR: 0.5 mg/dL (ref 0.00–1.89)
Microalb Creat Ratio: 5.1 mg/g (ref 0.0–30.0)

## 2013-12-26 LAB — HEPATITIS B SURFACE ANTIBODY,QUALITATIVE: Hep B S Ab: NEGATIVE

## 2013-12-26 LAB — VITAMIN B12: Vitamin B-12: 399 pg/mL (ref 211–911)

## 2013-12-26 LAB — VITAMIN D 25 HYDROXY (VIT D DEFICIENCY, FRACTURES): VIT D 25 HYDROXY: 83 ng/mL (ref 30–89)

## 2013-12-26 LAB — HEPATITIS C ANTIBODY: HCV AB: NEGATIVE

## 2013-12-26 LAB — TSH: TSH: 1.143 u[IU]/mL (ref 0.350–4.500)

## 2013-12-26 LAB — INSULIN, FASTING: INSULIN FASTING, SERUM: 8 u[IU]/mL (ref 3–28)

## 2013-12-27 ENCOUNTER — Other Ambulatory Visit: Payer: Self-pay | Admitting: Internal Medicine

## 2013-12-27 LAB — TB SKIN TEST
INDURATION: 0 mm
TB Skin Test: NEGATIVE

## 2013-12-27 LAB — HEPATITIS B E ANTIBODY: HEPATITIS BE ANTIBODY: NONREACTIVE

## 2013-12-27 MED ORDER — ATORVASTATIN CALCIUM 80 MG PO TABS: ORAL_TABLET | ORAL | Status: DC

## 2014-04-22 ENCOUNTER — Ambulatory Visit (INDEPENDENT_AMBULATORY_CARE_PROVIDER_SITE_OTHER): Payer: 59 | Admitting: Physician Assistant

## 2014-04-22 ENCOUNTER — Encounter: Payer: Self-pay | Admitting: Physician Assistant

## 2014-04-22 VITALS — BP 118/70 | HR 64 | Temp 97.9°F | Resp 16 | Ht 72.25 in | Wt 154.0 lb

## 2014-04-22 DIAGNOSIS — J449 Chronic obstructive pulmonary disease, unspecified: Secondary | ICD-10-CM

## 2014-04-22 DIAGNOSIS — Z79899 Other long term (current) drug therapy: Secondary | ICD-10-CM

## 2014-04-22 DIAGNOSIS — E559 Vitamin D deficiency, unspecified: Secondary | ICD-10-CM

## 2014-04-22 DIAGNOSIS — Z720 Tobacco use: Secondary | ICD-10-CM | POA: Insufficient documentation

## 2014-04-22 DIAGNOSIS — I10 Essential (primary) hypertension: Secondary | ICD-10-CM

## 2014-04-22 DIAGNOSIS — F17200 Nicotine dependence, unspecified, uncomplicated: Secondary | ICD-10-CM | POA: Insufficient documentation

## 2014-04-22 DIAGNOSIS — E785 Hyperlipidemia, unspecified: Secondary | ICD-10-CM

## 2014-04-22 DIAGNOSIS — R7303 Prediabetes: Secondary | ICD-10-CM

## 2014-04-22 LAB — BASIC METABOLIC PANEL WITH GFR
BUN: 16 mg/dL (ref 6–23)
CHLORIDE: 102 meq/L (ref 96–112)
CO2: 30 mEq/L (ref 19–32)
Calcium: 9.1 mg/dL (ref 8.4–10.5)
Creat: 0.95 mg/dL (ref 0.50–1.35)
GFR, EST NON AFRICAN AMERICAN: 84 mL/min
GFR, Est African American: >89 mL/min
Glucose, Bld: 85 mg/dL (ref 70–99)
POTASSIUM: 4.4 meq/L (ref 3.5–5.3)
SODIUM: 140 meq/L (ref 135–145)

## 2014-04-22 LAB — HEPATIC FUNCTION PANEL
ALK PHOS: 86 U/L (ref 39–117)
ALT: 14 U/L (ref 0–53)
AST: 19 U/L (ref 0–37)
Albumin: 3.8 g/dL (ref 3.5–5.2)
BILIRUBIN DIRECT: 0.1 mg/dL (ref 0.0–0.3)
BILIRUBIN INDIRECT: 0.6 mg/dL (ref 0.2–1.2)
BILIRUBIN TOTAL: 0.7 mg/dL (ref 0.2–1.2)
Total Protein: 6.1 g/dL (ref 6.0–8.3)

## 2014-04-22 LAB — CBC WITH DIFFERENTIAL/PLATELET
BASOS ABS: 0.1 10*3/uL (ref 0.0–0.1)
Basophils Relative: 1 % (ref 0–1)
Eosinophils Absolute: 0.1 10*3/uL (ref 0.0–0.7)
Eosinophils Relative: 1 % (ref 0–5)
HCT: 44.1 % (ref 39.0–52.0)
Hemoglobin: 14.9 g/dL (ref 13.0–17.0)
LYMPHS ABS: 2.9 10*3/uL (ref 0.7–4.0)
LYMPHS PCT: 27 % (ref 12–46)
MCH: 31.7 pg (ref 26.0–34.0)
MCHC: 33.8 g/dL (ref 30.0–36.0)
MCV: 93.8 fL (ref 78.0–100.0)
MPV: 9.2 fL — AB (ref 9.4–12.4)
Monocytes Absolute: 0.6 10*3/uL (ref 0.1–1.0)
Monocytes Relative: 6 % (ref 3–12)
NEUTROS ABS: 6.9 10*3/uL (ref 1.7–7.7)
NEUTROS PCT: 65 % (ref 43–77)
PLATELETS: 165 10*3/uL (ref 150–400)
RBC: 4.7 MIL/uL (ref 4.22–5.81)
RDW: 13.1 % (ref 11.5–15.5)
WBC: 10.6 10*3/uL — AB (ref 4.0–10.5)

## 2014-04-22 LAB — LIPID PANEL
CHOL/HDL RATIO: 4.9 ratio
Cholesterol: 196 mg/dL (ref 0–200)
HDL: 40 mg/dL (ref >39)
LDL CALC: 99 mg/dL (ref 0–99)
Triglycerides: 283 mg/dL — ABNORMAL HIGH (ref <150)
VLDL: 57 mg/dL — ABNORMAL HIGH (ref 0–40)

## 2014-04-22 LAB — TSH: TSH: 2.375 u[IU]/mL (ref 0.350–4.500)

## 2014-04-22 LAB — MAGNESIUM: Magnesium: 2 mg/dL (ref 1.5–2.5)

## 2014-04-22 LAB — HEMOGLOBIN A1C
Hgb A1c MFr Bld: 5.6 % (ref <5.7)
Mean Plasma Glucose: 114 mg/dL (ref <117)

## 2014-04-22 MED ORDER — AZITHROMYCIN 250 MG PO TABS: ORAL_TABLET | ORAL | Status: AC

## 2014-04-22 MED ORDER — ALBUTEROL SULFATE HFA 108 (90 BASE) MCG/ACT IN AERS: 1.0000 | INHALATION_SPRAY | Freq: Four times a day (QID) | RESPIRATORY_TRACT | Status: DC | PRN

## 2014-04-22 NOTE — Progress Notes (Signed)
Assessment and Plan:  Hypertension: Continue medication, monitor blood pressure at home. Continue DASH diet.  Reminder to go to the ER if any CP, SOB, nausea, dizziness, severe HA, changes vision/speech, left arm numbness and tingling, and jaw pain. Cholesterol: Continue diet and exercise. Check cholesterol.  Pre-diabetes-Continue diet and exercise. Check A1C Vitamin D Def- check level and continue medications.  COPD exacerbation/tobacco use-  discussed with patient, understands risk of MI, stroke, cancer, and death.   Zpak, albuterol, continue Dulera, add allergy pill, mucinex.  ASHD-Control blood pressure, cholesterol, glucose, increase exercise.  Declines flu vaccine will consider Prevnar at next visit when 78.   Continue diet and meds as discussed. Further disposition pending results of labs.  HPI 63 y.o. male  presents for 3 month follow up with hypertension, hyperlipidemia, prediabetes and vitamin D. His blood pressure has been controlled at home, he is on Ziac 10/6.25mg  daily, today their BP is BP: 118/70 mmHg  He had acute MI in 2005 with PTCA/stenting, negative cardiolite in 2007. He is on bASA and BB.  He does not workout, but is very active at work walking a lot. He denies chest pain, shortness of breath, dizziness.  He is on cholesterol medication, lipitor, but has not taking it for 3 months. His cholesterol is not at goal. The cholesterol last visit was:   Lab Results  Component Value Date   CHOL 214* 12/25/2013   HDL 62 12/25/2013   LDLCALC 135* 12/25/2013   TRIG 87 12/25/2013   CHOLHDL 3.5 12/25/2013   He has been working on diet and exercise for prediabetes, and denies paresthesia of the feet, polydipsia, polyuria and visual disturbances. Last A1C in the office was:  Lab Results  Component Value Date   HGBA1C 5.5 12/25/2013   Patient is on Vitamin D supplement.   Lab Results  Component Value Date   VD25OH 31 12/25/2013     He is current every day smoker, has COPD,  uses Dulera, denies exacerbation. Last CXR 12/25/2013. He has moved in with his mother of 5 who is also a smoker and states he has been smoking more since moving in with her. He states that he has been having increasing cough, mucus and some wheezing for the past week, denies fever, chills, CP.   Current Medications:  Current Outpatient Prescriptions on File Prior to Visit  Medication Sig Dispense Refill  . atorvastatin (LIPITOR) 80 MG tablet Take 1/2 to 1 tablet daily or as directed for cholesterol 30 tablet 99  . bisoprolol-hydrochlorothiazide (ZIAC) 10-6.25 MG per tablet Take 0.5 tablets by mouth daily.    . cholecalciferol (VITAMIN D) 1000 UNITS tablet 1,000 Units. 4000 total a day    . mometasone-formoterol (DULERA) 100-5 MCG/ACT AERO Inhale 2 puffs into the lungs 2 (two) times daily.    . Multiple Vitamin (MULTIVITAMIN) tablet Take 1 tablet by mouth daily. Takes a vitamin pack daily    . saw palmetto 500 MG capsule Take 500 mg by mouth daily.     No current facility-administered medications on file prior to visit.   Medical History:  Past Medical History  Diagnosis Date  . GERD (gastroesophageal reflux disease)   . BPH (benign prostatic hyperplasia)   . Vitamin D deficiency   . Prediabetes   . Hypogonadism male   . COPD (chronic obstructive pulmonary disease)     via CT lung  . Hypertension   . Hyperlipidemia   . Myocardial infarction 2005    Per patient no stent  or PTCA, just medical treatment   Allergies:  Allergies  Allergen Reactions  . Ace Inhibitors Cough  . Ciprofloxacin   . Fenofibrate     dizzy     Review of Systems: [X]  = complains of  [ ]  = denies  General: Fatigue [ ]  Fever [ ]  Chills [ ]  Weakness [ ]   Insomnia [ ]  Eyes: Redness [ ]  Blurred vision [ ]  Diplopia [ ]   ENT: Congestion [ ]  Sinus Pain [ ]  Post Nasal Drip [ ]  Sore Throat [ ]  Earache [ ]   Cardiac: Chest pain/pressure [ ]  SOB [ ]  Orthopnea [ ]   Palpitations [ ]   Paroxysmal nocturnal dyspnea[ ]   Claudication [ ]  Edema [ ]   Pulmonary: Cough [x ] Wheezing[x ]  SOB [x]   Snoring [ ]   GI: Nausea [ ]  Vomiting[ ]  Dysphagia[ ]  Heartburn[ ]  Abdominal pain [ ]  Constipation [ ] ; Diarrhea [ ] ; BRBPR [ ]  Melena[ ]  GU: Hematuria[ ]  Dysuria [ ]  Nocturia[ ]  Urgency [ ]   Hesitancy [ ]  Discharge [ ]  Neuro: Headaches[ ]  Vertigo[ ]  Paresthesias[ ]  Spasm [ ]  Speech changes [ ]  Incoordination [ ]   Ortho: Arthritis [ ]  Joint pain [ ]  Muscle pain [ ]  Joint swelling [ ]  Back Pain [ ]  Skin:  Rash [ ]   Pruritis [ ]  Change in skin lesion [ ]   Psych: Depression[ ]  Anxiety[ ]  Confusion [ ]  Memory loss [ ]   Heme/Lypmh: Bleeding [ ]  Bruising [ ]  Enlarged lymph nodes [ ]   Endocrine: Visual blurring [ ]  Paresthesia [ ]  Polyuria [ ]  Polydypsea [ ]    Heat/cold intolerance [ ]  Hypoglycemia [ ]   Family history- Review and unchanged Social history- Review and unchanged Physical Exam: BP 118/70 mmHg  Pulse 64  Temp(Src) 97.9 F (36.6 C)  Resp 16  Ht 6' 0.25" (1.835 m)  Wt 154 lb (69.854 kg)  BMI 20.75 kg/m2 Wt Readings from Last 3 Encounters:  04/22/14 154 lb (69.854 kg)  12/25/13 152 lb 3.2 oz (69.037 kg)  06/29/13 160 lb 6.4 oz (72.757 kg)   General Appearance: Well nourished, in no apparent distress. Eyes: PERRLA, EOMs, conjunctiva no swelling or erythema Sinuses: No Frontal/maxillary tenderness ENT/Mouth: Ext aud canals clear, TMs without erythema, bulging. No erythema, swelling, or exudate on post pharynx.  Tonsils not swollen or erythematous. Hearing normal.  Neck: Supple, thyroid normal.  Respiratory: Respiratory effort normal, BS decrease with diffuse wheezing without rales, rhonchi, or stridor.  Cardio: RRR with no MRGs. Brisk peripheral pulses without edema.  Abdomen: Soft, + BS.  Non tender, no guarding, rebound, hernias, masses. Lymphatics: Non tender without lymphadenopathy.  Musculoskeletal: Full ROM, 5/5 strength, normal gait.  Skin: Warm, dry without rashes, lesions, ecchymosis.  Neuro:  Cranial nerves intact. Normal muscle tone, no cerebellar symptoms.  Psych: Awake and oriented X 3, normal affect, Insight and Judgment appropriate.    Vicie Mutters, PA-C 8:46 AM [facility]

## 2014-04-22 NOTE — Patient Instructions (Signed)
Benefiber is good for constipation/diarrhea/irritable bowel syndrome, it helps with weight loss and can help lower your bad cholesterol. Please do 1-2 TBSP in the morning in water, coffee, or tea. It can take up to a month before you can see a difference with your bowel movements. It is cheapest from [employer], [employer], [employer].   Your LDL is not in range. Your LDL is the bad cholesterol that can lead to heart attack and stroke. To lower your number you can decrease your fatty foods, red meat, cheese, milk and increase fiber like whole grains and veggies. You can also add a fiber supplement like Metamucil or Benefiber.   Smoking Cessation Quitting smoking is important to your health and has many advantages. However, it is not always easy to quit since nicotine is a very addictive drug. Oftentimes, people try 3 times or more before being able to quit. This document explains the best ways for you to prepare to quit smoking. Quitting takes hard work and a lot of effort, but you can do it. ADVANTAGES OF QUITTING SMOKING  You will live longer, feel better, and live better.  Your body will feel the impact of quitting smoking almost immediately.  Within 20 minutes, blood pressure decreases. Your pulse returns to its normal level.  After 8 hours, carbon monoxide levels in the blood return to normal. Your oxygen level increases.  After 24 hours, the chance of having a heart attack starts to decrease. Your breath, hair, and body stop smelling like smoke.  After 48 hours, damaged nerve endings begin to recover. Your sense of taste and smell improve.  After 72 hours, the body is virtually free of nicotine. Your bronchial tubes relax and breathing becomes easier.  After 2 to 12 weeks, lungs can hold more air. Exercise becomes easier and circulation improves.  The risk of having a heart attack, stroke, cancer, or lung disease is greatly reduced.  After 1 year, the risk of coronary heart disease is cut in  half.  After 5 years, the risk of stroke falls to the same as a nonsmoker.  After 10 years, the risk of lung cancer is cut in half and the risk of other cancers decreases significantly.  After 15 years, the risk of coronary heart disease drops, usually to the level of a nonsmoker.  If you are pregnant, quitting smoking will improve your chances of having a healthy baby.  The people you live with, especially any children, will be healthier.  You will have extra money to spend on things other than cigarettes. QUESTIONS TO THINK ABOUT BEFORE ATTEMPTING TO QUIT You may want to talk about your answers with your health care provider.  Why do you want to quit?  If you tried to quit in the past, what helped and what did not?  What will be the most difficult situations for you after you quit? How will you plan to handle them?  Who can help you through the tough times? Your family? Friends? A health care provider?  What pleasures do you get from smoking? What ways can you still get pleasure if you quit? Here are some questions to ask your health care provider:  How can you help me to be successful at quitting?  What medicine do you think would be best for me and how should I take it?  What should I do if I need more help?  What is smoking withdrawal like? How can I get information on withdrawal? GET READY  Set  a quit date.  Change your environment by getting rid of all cigarettes, ashtrays, matches, and lighters in your home, car, or work. Do not let people smoke in your home.  Review your past attempts to quit. Think about what worked and what did not. GET SUPPORT AND ENCOURAGEMENT You have a better chance of being successful if you have help. You can get support in many ways.  Tell your family, friends, and coworkers that you are going to quit and need their support. Ask them not to smoke around you.  Get individual, group, or telephone counseling and support. Programs are  available at [employer] and health centers. Call your local health department for information about programs in your area.  Spiritual beliefs and practices may help some smokers quit.  Download a "quit meter" on your computer to keep track of quit statistics, such as how long you have gone without smoking, cigarettes not smoked, and money saved.  Get a self-help book about quitting smoking and staying off tobacco. [place] yourself from urges to smoke. Talk to someone, go for a walk, or occupy your time with a task.  Change your normal routine. Take a different route to work. Drink tea instead of coffee. Eat breakfast in a different place.  Reduce your stress. Take a hot bath, exercise, or read a book.  Plan something enjoyable to do every day. Reward yourself for not smoking.  Explore interactive web-based programs that specialize in helping you quit. GET MEDICINE AND USE IT CORRECTLY Medicines can help you stop smoking and decrease the urge to smoke. Combining medicine with the above behavioral methods and support can greatly increase your chances of successfully quitting smoking.  Nicotine replacement therapy helps deliver nicotine to your body without the negative effects and risks of smoking. Nicotine replacement therapy includes nicotine gum, lozenges, inhalers, nasal sprays, and skin patches. Some may be available over-the-counter and others require a prescription.  Antidepressant medicine helps people abstain from smoking, but how this works is unknown. This medicine is available by prescription.  Nicotinic receptor partial agonist medicine simulates the effect of nicotine in your brain. This medicine is available by prescription. Ask your health care provider for advice about which medicines to use and how to use them based on your health history. Your health care provider will tell you what side effects to look out for if you choose to be on a  medicine or therapy. Carefully read the information on the package. Do not use any other product containing nicotine while using a nicotine replacement product.  RELAPSE OR DIFFICULT SITUATIONS Most relapses occur within the first 3 months after quitting. Do not be discouraged if you start smoking again. Remember, most people try several times before finally quitting. You may have symptoms of withdrawal because your body is used to nicotine. You may crave cigarettes, be irritable, feel very hungry, cough often, get headaches, or have difficulty concentrating. The withdrawal symptoms are only temporary. They are strongest when you first quit, but they will go away within 10-14 days. To reduce the chances of relapse, try to:  Avoid drinking alcohol. Drinking lowers your chances of successfully quitting.  Reduce the amount of caffeine you consume. Once you quit smoking, the amount of caffeine in your body increases and can give you symptoms, such as a rapid heartbeat, sweating, and anxiety.  Avoid smokers because they can make you want to smoke.  Do not let weight gain distract you.  Many smokers will gain weight when they quit, usually less than 10 pounds. Eat a healthy diet and stay active. You can always lose the weight gained after you quit.  Find ways to improve your mood other than smoking. FOR MORE INFORMATION  www.smokefree.gov  Document Released: 05/04/2001 Document Revised: 09/24/2013 Document Reviewed: 08/19/2011 [facility] Patient Information 2015 [place], [place]. This information is not intended to replace advice given to you by your health care provider. Make sure you discuss any questions you have with your health care provider.

## 2014-04-23 LAB — VITAMIN D 25 HYDROXY (VIT D DEFICIENCY, FRACTURES): VIT D 25 HYDROXY: 58 ng/mL (ref 30–100)

## 2014-07-26 ENCOUNTER — Encounter: Payer: Self-pay | Admitting: Internal Medicine

## 2014-07-26 ENCOUNTER — Other Ambulatory Visit: Payer: Self-pay | Admitting: *Deleted

## 2014-07-26 ENCOUNTER — Ambulatory Visit (INDEPENDENT_AMBULATORY_CARE_PROVIDER_SITE_OTHER): Payer: 59 | Admitting: Internal Medicine

## 2014-07-26 VITALS — BP 126/70 | HR 64 | Temp 97.9°F | Resp 16 | Ht 72.0 in | Wt 156.4 lb

## 2014-07-26 DIAGNOSIS — E785 Hyperlipidemia, unspecified: Secondary | ICD-10-CM

## 2014-07-26 DIAGNOSIS — R7309 Other abnormal glucose: Secondary | ICD-10-CM

## 2014-07-26 DIAGNOSIS — I2510 Atherosclerotic heart disease of native coronary artery without angina pectoris: Secondary | ICD-10-CM | POA: Insufficient documentation

## 2014-07-26 DIAGNOSIS — Z79899 Other long term (current) drug therapy: Secondary | ICD-10-CM

## 2014-07-26 DIAGNOSIS — E559 Vitamin D deficiency, unspecified: Secondary | ICD-10-CM

## 2014-07-26 DIAGNOSIS — R7303 Prediabetes: Secondary | ICD-10-CM

## 2014-07-26 DIAGNOSIS — J42 Unspecified chronic bronchitis: Secondary | ICD-10-CM

## 2014-07-26 DIAGNOSIS — I10 Essential (primary) hypertension: Secondary | ICD-10-CM

## 2014-07-26 LAB — LIPID PANEL
Cholesterol: 232 mg/dL — ABNORMAL HIGH (ref 0–200)
HDL: 43 mg/dL (ref >=40)
LDL Cholesterol: 147 mg/dL — ABNORMAL HIGH (ref 0–99)
Total CHOL/HDL Ratio: 5.4 Ratio
Triglycerides: 212 mg/dL — ABNORMAL HIGH (ref <150)
VLDL: 42 mg/dL — ABNORMAL HIGH (ref 0–40)

## 2014-07-26 LAB — BASIC METABOLIC PANEL WITH GFR
BUN: 15 mg/dL (ref 6–23)
CALCIUM: 9.4 mg/dL (ref 8.4–10.5)
CO2: 31 mEq/L (ref 19–32)
Chloride: 104 mEq/L (ref 96–112)
Creat: 0.99 mg/dL (ref 0.50–1.35)
GFR, EST NON AFRICAN AMERICAN: 80 mL/min
Glucose, Bld: 90 mg/dL (ref 70–99)
POTASSIUM: 5.2 meq/L (ref 3.5–5.3)
Sodium: 140 mEq/L (ref 135–145)

## 2014-07-26 MED ORDER — MOMETASONE FURO-FORMOTEROL FUM 100-5 MCG/ACT IN AERO: 2.0000 | INHALATION_SPRAY | Freq: Two times a day (BID) | RESPIRATORY_TRACT | Status: DC

## 2014-07-26 NOTE — Patient Instructions (Signed)

## 2014-07-27 NOTE — Progress Notes (Signed)
Patient ID: Michael [name], male   DOB: [birth year], 64 y.o.   MRN: [id number]   This very nice 64 y.o.male presents for 3 month follow up with Hypertension, Hyperlipidemia, Pre-Diabetes and Vitamin D Deficiency.    Patient is treated for HTN since 2004 and in 2005 he presented with MI and underwent PTCA/Stents and has done well to present , unfortunately has been recalcitrant to quitting smoking. BP has been controlled at home. Today's BP: 126/70 mmHg. Patient has had no complaints of any cardiac type chest pain, palpitations, dyspnea/orthopnea/PND, dizziness, claudication, or dependent edema.   Hyperlipidemia is controlled with diet & meds. Patient denies myalgias or other med SE's. Patient is statin intolerant with severe myalgias and last Lipids were not at goal - Total Chol  232*; HDL 43; LDL  147; and with elevated Trig 212 on 07/26/2014.   Also, the patient has history of PreDiabeteswith A1c 5.7% in 2010 and has had no symptoms of reactive hypoglycemia, diabetic polys, paresthesias or visual blurring.  Last A1c was 5.6% on 04/22/2014.   Further, the patient also has history of Vitamin D Deficiency of 25 in 2008 and supplements vitamin D without any suspected side-effects. Last vitamin D was  58 on  04/22/2014.  Medication Sig  . albuterol (PROVENTIL HFA;VENTOLIN HFA) 108 (90 BASE) MCG/ACT inhaler Inhale 1-2 puffs into the lungs every 6 (six) hours as needed for wheezing or shortness of breath.  . bisoprolol-hydrochlorothiazide (ZIAC) 10-6.25 MG per tablet Take 0.5 tablets by mouth daily.  . cholecalciferol (VITAMIN D) 1000 UNITS tablet 1,000 Units. 4000 total a day  . Multiple Vitamin (MULTIVITAMIN) tablet Take 1 tablet by mouth daily. Takes a vitamin pack daily  . saw palmetto 500 MG capsule Take 500 mg by mouth daily.  . mometasone-formoterol (DULERA) 100-5 MCG/ACT AERO Inhale 2 puffs into the lungs 2 (two) times daily.  Marland Kitchen atorvastatin (LIPITOR) 80 MG tablet Take 1/2 to 1 tablet daily or as  directed for cholesterol (Patient not taking: Reported on 07/26/2014)   Allergies  Allergen Reactions  . Ace Inhibitors Cough  . Ciprofloxacin   . Fenofibrate     dizzy   PMHx:   Past Medical History  Diagnosis Date  . GERD (gastroesophageal reflux disease)   . BPH (benign prostatic hyperplasia)   . Vitamin D deficiency   . Prediabetes   . Hypogonadism male   . COPD (chronic obstructive pulmonary disease)     via CT lung  . Hypertension   . Hyperlipidemia   . Myocardial infarction 2005    Per patient no stent or PTCA, just medical treatment   Immunization History  Administered Date(s) Administered  . DTaP 12/08/2010  . PPD Test 12/25/2013  . Pneumococcal-Unspecified 12/08/2010   Past Surgical History  Procedure Laterality Date  . Coronary angioplasty with stent placement N/A 2005    Per patient no stent or PTCA, just medical treatment  . Prostate surgery      Biopsy X 3  . Amputation finger / thumb Left 1991    distal index  . Vasectomy     FHx:    Reviewed / unchanged  SHx:    Reviewed / unchanged  Systems Review:  Constitutional: Denies fever, chills, wt changes, headaches, insomnia, fatigue, night sweats, change in appetite. Eyes: Denies redness, blurred vision, diplopia, discharge, itchy, watery eyes.  ENT: Denies discharge, congestion, post nasal drip, epistaxis, sore throat, earache, hearing loss, dental pain, tinnitus, vertigo, sinus pain, snoring.  CV: Denies chest pain, palpitations,  irregular heartbeat, syncope, dyspnea, diaphoresis, orthopnea, PND, claudication or edema. Respiratory: denies cough, dyspnea, DOE, pleurisy, hoarseness, laryngitis, wheezing.  Gastrointestinal: Denies dysphagia, odynophagia, heartburn, reflux, water brash, abdominal pain or cramps, nausea, vomiting, bloating, diarrhea, constipation, hematemesis, melena, hematochezia  or hemorrhoids. Genitourinary: Denies dysuria, frequency, urgency, nocturia, hesitancy, discharge, hematuria or  flank pain. Musculoskeletal: Denies arthralgias, myalgias, stiffness, jt. swelling, pain, limping or strain/sprain.  Skin: Denies pruritus, rash, hives, warts, acne, eczema or change in skin lesion(s). Neuro: No weakness, tremor, incoordination, spasms, paresthesia or pain. Psychiatric: Denies confusion, memory loss or sensory loss. Endo: Denies change in weight, skin or hair change.  Heme/Lymph: No excessive bleeding, bruising or enlarged lymph nodes.  Physical Exam  BP 126/70   Pulse 64  Temp97.9 F   Resp 16  Ht 6' (  Wt 156 lb 6.4 oz     BMI 21.21   Appears well nourished and in no distress. Eyes: PERRLA, EOMs, conjunctiva no swelling or erythema. Sinuses: No frontal/maxillary tenderness ENT/Mouth: EAC's clear, TM's nl w/o erythema, bulging. Nares clear w/o erythema, swelling, exudates. Oropharynx clear without erythema or exudates. Oral hygiene is good. Tongue normal, non obstructing. Hearing intact.  Neck: Supple. Thyroid nl. Car 2+/2+ without bruits, nodes or JVD. Chest: Respirations nl with BS clear & equal w/o rales, rhonchi, wheezing or stridor.  Cor: Heart sounds normal w/ regular rate and rhythm without sig. murmurs, gallops, clicks, or rubs. Peripheral pulses normal and equal  without edema.  Lymphatics: Unremarkable.  Musculoskeletal: Full ROM all peripheral extremities, joint stability, 5/5 strength, and normal gait.  Skin: Warm, dry without exposed rashes, lesions or ecchymosis apparent.  Neuro: Cranial nerves intact, reflexes equal bilaterally. Sensory-motor testing grossly intact. Tendon reflexes grossly intact.  Pysch: Alert & oriented x 3.  Insight and judgement nl & appropriate. No ideations.  Assessment and Plan:  1. Hypertension / ASHD s/p Stent - Continue monitor blood pressure at home. Encouraged regular exercise. Encouraged stricter low chol diet & meds same.  2. Hyperlipidemia w/ Statin Intolerance  - Continue diet/, exercise,& lifestyle modifications.  Continue monitor periodic cholesterol/liver & renal functions. Disposition regarding medications pending labs.   3. Pre-Diabetes - Continue diet, exercise, lifestyle modifications. Monitor appropriate labs.  4. Vitamin D Deficiency - Continue supplementation.   Recommended regular exercise, BP monitoring, weight control, and discussed med and SE's. Recommended labs to assess and monitor clinical status. Further disposition pending results of labs.

## 2014-07-29 ENCOUNTER — Other Ambulatory Visit: Payer: Self-pay | Admitting: Internal Medicine

## 2014-07-29 DIAGNOSIS — E782 Mixed hyperlipidemia: Secondary | ICD-10-CM

## 2014-07-29 MED ORDER — GEMFIBROZIL 600 MG PO TABS: 600.0000 mg | ORAL_TABLET | Freq: Two times a day (BID) | ORAL | Status: DC

## 2014-09-10 ENCOUNTER — Other Ambulatory Visit: Payer: Self-pay | Admitting: Internal Medicine

## 2014-11-14 ENCOUNTER — Ambulatory Visit: Payer: Self-pay | Admitting: Internal Medicine

## 2014-12-27 ENCOUNTER — Ambulatory Visit (HOSPITAL_COMMUNITY)
Admission: RE | Admit: 2014-12-27 | Discharge: 2014-12-27 | Disposition: A | Discharge status: Home / Self Care | Payer: 59 | Source: Ambulatory Visit | Attending: Physician Assistant | Admitting: Physician Assistant

## 2014-12-27 ENCOUNTER — Encounter: Payer: Self-pay | Admitting: Physician Assistant

## 2014-12-27 ENCOUNTER — Ambulatory Visit (INDEPENDENT_AMBULATORY_CARE_PROVIDER_SITE_OTHER): Payer: 59 | Admitting: Physician Assistant

## 2014-12-27 VITALS — BP 142/70 | HR 68 | Temp 97.3°F | Resp 16 | Ht 72.0 in | Wt 154.4 lb

## 2014-12-27 DIAGNOSIS — Z720 Tobacco use: Secondary | ICD-10-CM

## 2014-12-27 DIAGNOSIS — Z79899 Other long term (current) drug therapy: Secondary | ICD-10-CM | POA: Diagnosis not present

## 2014-12-27 DIAGNOSIS — F17210 Nicotine dependence, cigarettes, uncomplicated: Secondary | ICD-10-CM | POA: Insufficient documentation

## 2014-12-27 DIAGNOSIS — R972 Elevated prostate specific antigen [PSA]: Secondary | ICD-10-CM | POA: Diagnosis not present

## 2014-12-27 DIAGNOSIS — J42 Unspecified chronic bronchitis: Secondary | ICD-10-CM

## 2014-12-27 DIAGNOSIS — I10 Essential (primary) hypertension: Secondary | ICD-10-CM

## 2014-12-27 DIAGNOSIS — R7303 Prediabetes: Secondary | ICD-10-CM

## 2014-12-27 DIAGNOSIS — I2510 Atherosclerotic heart disease of native coronary artery without angina pectoris: Secondary | ICD-10-CM | POA: Diagnosis not present

## 2014-12-27 DIAGNOSIS — R7309 Other abnormal glucose: Secondary | ICD-10-CM | POA: Diagnosis not present

## 2014-12-27 DIAGNOSIS — E785 Hyperlipidemia, unspecified: Secondary | ICD-10-CM

## 2014-12-27 DIAGNOSIS — J449 Chronic obstructive pulmonary disease, unspecified: Secondary | ICD-10-CM | POA: Insufficient documentation

## 2014-12-27 DIAGNOSIS — F17200 Nicotine dependence, unspecified, uncomplicated: Secondary | ICD-10-CM

## 2014-12-27 DIAGNOSIS — Z0001 Encounter for general adult medical examination with abnormal findings: Secondary | ICD-10-CM

## 2014-12-27 DIAGNOSIS — Z89022 Acquired absence of left finger(s): Secondary | ICD-10-CM | POA: Diagnosis not present

## 2014-12-27 DIAGNOSIS — N400 Enlarged prostate without lower urinary tract symptoms: Secondary | ICD-10-CM

## 2014-12-27 DIAGNOSIS — D649 Anemia, unspecified: Secondary | ICD-10-CM | POA: Diagnosis not present

## 2014-12-27 DIAGNOSIS — R6889 Other general symptoms and signs: Secondary | ICD-10-CM

## 2014-12-27 DIAGNOSIS — E559 Vitamin D deficiency, unspecified: Secondary | ICD-10-CM

## 2014-12-27 LAB — CBC WITH DIFFERENTIAL/PLATELET
Basophils Absolute: 0.1 10*3/uL (ref 0.0–0.1)
Basophils Relative: 1 % (ref 0–1)
Eosinophils Absolute: 0.1 10*3/uL (ref 0.0–0.7)
Eosinophils Relative: 1 % (ref 0–5)
HEMATOCRIT: 43.2 % (ref 39.0–52.0)
Hemoglobin: 14.9 g/dL (ref 13.0–17.0)
LYMPHS PCT: 31 % (ref 12–46)
Lymphs Abs: 2.7 10*3/uL (ref 0.7–4.0)
MCH: 33 pg (ref 26.0–34.0)
MCHC: 34.5 g/dL (ref 30.0–36.0)
MCV: 95.6 fL (ref 78.0–100.0)
MONO ABS: 0.7 10*3/uL (ref 0.1–1.0)
MPV: 9.6 fL (ref 8.6–12.4)
Monocytes Relative: 8 % (ref 3–12)
NEUTROS ABS: 5.1 10*3/uL (ref 1.7–7.7)
Neutrophils Relative %: 59 % (ref 43–77)
Platelets: 119 10*3/uL — ABNORMAL LOW (ref 150–400)
RBC: 4.52 MIL/uL (ref 4.22–5.81)
RDW: 13.8 % (ref 11.5–15.5)
WBC: 8.6 10*3/uL (ref 4.0–10.5)

## 2014-12-27 MED ORDER — BUDESONIDE-FORMOTEROL FUMARATE 160-4.5 MCG/ACT IN AERO: 2.0000 | INHALATION_SPRAY | Freq: Two times a day (BID) | RESPIRATORY_TRACT | Status: DC

## 2014-12-27 NOTE — Progress Notes (Signed)
Complete Physical  Assessment and Plan: 1. Essential hypertension - continue medications, DASH diet, exercise and monitor at home. Call if greater than 130/80.  - CBC with Differential/Platelet - BASIC METABOLIC PANEL WITH GFR - Hepatic function panel - TSH - Urinalysis, Routine w reflex microscopic (not at [facility]) - Microalbumin / creatinine urine ratio - EKG 12-Lead - DG Chest 2 View; Future  2. Prediabetes Discussed general issues about diabetes pathophysiology and management., Educational material distributed., Suggested low cholesterol diet., Encouraged aerobic exercise., Discussed foot care., Reminded to get yearly retinal exam. - Hemoglobin A1c - Insulin, fasting - LOW EXTREMITY NEUR EXAM DOCUM  3. Hyperlipidemia -continue medications, check lipids, decrease fatty foods, increase activity.  - Lipid panel  4. Chronic bronchitis, unspecified chronic bronchitis type Advised to stop smoking, will get CXR, continue meds.  - budesonide-formoterol (SYMBICORT) 160-4.5 MCG/ACT inhaler; Inhale 2 puffs into the lungs 2 (two) times daily.  Dispense: 1 Inhaler; Refill: 12  5. ASHD s/p Stent No changes in EKG Control blood pressure, cholesterol, glucose, increase exercise.  Advised to quit smoking - EKG 12-Lead  6. BPH (benign prostatic hyperplasia) Follows with [name]  7. Vitamin D deficiency - Vit D  25 hydroxy (rtn osteoporosis monitoring)  8. Tobacco use disorder Smoking cessation-  instruction/counseling given, counseled patient on the dangers of tobacco use, advised patient to stop smoking, and reviewed strategies to maximize success, patient not ready to quit at this time.  - EKG 12-Lead - [place], RETROPERITNL ABD,  LTD - DG Chest 2 View; Future  9. Medication management - Magnesium  10. Elevated PSA Follows 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 Anemia, unspecified anemia type - Iron and TIBC - Ferritin - Vitamin B12  Discussed med's  effects and SE's. Screening labs and tests as requested with regular follow-up as recommended. Over 40 minutes of exam, counseling, chart review and critical decision making was performed  HPI Patient presents for a complete physical.   His blood pressure has been controlled at home, 120-130's, today their BP is BP: (!) 142/70 mmHg  He has a history of acute MI in 2005 without PTCA/stenting per patient, negative cardiolite 2007, he is on BB and bASA.  He does not workout but is very active at work.Marland Kitchen He denies chest pain, shortness of breath, dizziness.  He is a current every day smoker, has COPD, on [place] but insurance states needs to go to [employer]. Last CXR 12/25/2013.  He is not on cholesterol medication, suppose to be on lipitor but never started, on fish oil. His cholesterol is not at goal. The cholesterol last visit was:   Lab Results  Component Value Date   CHOL 232* 07/26/2014   HDL 43 07/26/2014   LDLCALC 147* 07/26/2014   TRIG 212* 07/26/2014   CHOLHDL 5.4 07/26/2014   He has been working on diet and exercise for prediabetes, and denies paresthesia of the feet, polydipsia, polyuria and visual disturbances. Last A1C in the office was:  Lab Results  Component Value Date   HGBA1C 5.6 04/22/2014   Patient is on Vitamin D supplement.   Lab Results  Component Value Date   VD25OH 39 04/22/2014     Last PSA was elevated, has had history of prostate biopsy in the past, follows with [name] : Lab Results  Component Value Date   PSA 8.04* 12/25/2013   He also complains of left hip/bottocks pain x 6 months, has taken wallet out of  his pocket while, worse later in the day, okay to lay on it, some radiation of pain down lateral left hip to his knee. Denies numbness/tingling/weakness in his feet.    Current Medications:  Current Outpatient Prescriptions on File Prior to Visit  Medication Sig Dispense Refill  . albuterol (PROVENTIL HFA;VENTOLIN HFA) 108 (90 BASE) MCG/ACT inhaler  Inhale 1-2 puffs into the lungs every 6 (six) hours as needed for wheezing or shortness of breath. 18 g 2  . bisoprolol-hydrochlorothiazide (ZIAC) 10-6.25 MG per tablet TAKE ONE TABLET BY MOUTH ONCE DAILY FOR BLOOD PRESSURE 90 tablet 1  . cholecalciferol (VITAMIN D) 1000 UNITS tablet 1,000 Units. 4000 total a day    . gemfibrozil (LOPID) 600 MG tablet Take 1 tablet (600 mg total) by mouth 2 (two) times daily. 60 tablet 11  . mometasone-formoterol (DULERA) 100-5 MCG/ACT AERO Inhale 2 puffs into the lungs 2 (two) times daily. 13 g 12  . Multiple Vitamin (MULTIVITAMIN) tablet Take 1 tablet by mouth daily. Takes a vitamin pack daily    . saw palmetto 500 MG capsule Take 500 mg by mouth daily.     No current facility-administered medications on file prior to visit.   Health Maintenance:  Immunization History  Administered Date(s) Administered  . DTaP 12/08/2010  . PPD Test 12/25/2013  . Pneumococcal-Unspecified 12/08/2010   Tetanus: 2012 Pneumovax: 2012 Prevnar 13:DUE Flu vaccine: declines Zostavax: N/A DEXA: N/A Colonoscopy: 10/2012, Dr. Marisue Ivan in [place], due 2019 EGD: N/A Eye Exam: Dr. Nicki Reaper, 1 year ago, due Dentist: None, last visit 3 years ago Ct chest 2013 CXR 12/2013  Patient Care Team: Unk Pinto, MD as PCP - General (Internal Medicine) Lowella Bandy, MD as Consulting Physician (Urology)  Allergies:  Allergies  Allergen Reactions  . Ace Inhibitors Cough  . Ciprofloxacin   . Fenofibrate     dizzy   Medical History:  Past Medical History  Diagnosis Date  . GERD (gastroesophageal reflux disease)   . BPH (benign prostatic hyperplasia)   . Vitamin D deficiency   . Prediabetes   . Hypogonadism male   . COPD (chronic obstructive pulmonary disease)     via CT lung  . Hypertension   . Hyperlipidemia   . Myocardial infarction 2005    Per patient no stent or PTCA, just medical treatment   Surgical History:  Past Surgical History  Procedure Laterality Date   . Coronary angioplasty with stent placement N/A 2005    Per patient no stent or PTCA, just medical treatment  . Prostate surgery      Biopsy X 3  . Amputation finger / thumb Left 1991    distal index  . Vasectomy     Family History:  Family History  Problem Relation Age of Onset  . Cancer Father 72    lymphoma  . Cancer Maternal Aunt     Lung  . Cancer Maternal Uncle     Lung  . Cancer Maternal Uncle     Lung   Social History:   History  Substance Use Topics  . Smoking status: Current Every Day Smoker -- 1.00 packs/day for 36 years    Types: Cigarettes  . Smokeless tobacco: Never Used  . Alcohol Use: 3.0 oz/week    5 Cans of beer per week   Review of Systems:  Review of Systems  Constitutional: Negative.  Negative for fever and chills.  HENT: Negative.   Eyes: Negative.   Respiratory: Positive for cough and shortness of breath. Negative  for hemoptysis, sputum production and wheezing.   Cardiovascular: Negative.  Negative for chest pain, claudication and leg swelling.  Gastrointestinal: Negative.   Genitourinary: Negative.   Musculoskeletal: Positive for back pain and joint pain (left buttcoks/hip down lateral leg to his knee, no numbness/tingling/weakness in his leg). Negative for myalgias, falls and neck pain.  Skin: Negative.   Neurological: Negative.  Negative for dizziness.  Psychiatric/Behavioral: Negative.  Negative for depression and memory loss. The patient is not nervous/anxious.     Physical Exam: Estimated body mass index is 20.94 kg/(m^2) as calculated from the following:   Height as of this encounter: 6' (1.829 m).   Weight as of this encounter: 154 lb 6.4 oz (70.035 kg). BP 142/70 mmHg  Pulse 68  Temp(Src) 97.3 F (36.3 C)  Resp 16  Ht 6' (1.829 m)  Wt 154 lb 6.4 oz (70.035 kg)  BMI 20.94 kg/m2 General Appearance: Well nourished, in no apparent distress.  Eyes: PERRLA, EOMs, conjunctiva no swelling or erythema, normal fundi and vessels.   Sinuses: No Frontal/maxillary tenderness  ENT/Mouth: Ext aud canals clear, normal light reflex with TMs without erythema, bulging. Good dentition. No erythema, swelling, or exudate on post pharynx. Tonsils not swollen or erythematous. Hearing normal.  Neck: Supple, thyroid normal. No bruits  Respiratory: Respiratory effort normal, BS equal bilaterally without rales, rhonchi, wheezing or stridor.  Cardio: RRR without murmurs, rubs or gallops. Brisk peripheral pulses without edema.  Chest: symmetric, with normal excursions and percussion.  Abdomen: Soft, nontender, no guarding, rebound, hernias, masses, or organomegaly.  Lymphatics: Non tender without lymphadenopathy.  Genitourinary: defer [name] Musculoskeletal: Full ROM all peripheral extremities,5/5 strength, and normal gait. + Left SI and left bursa tenderness, negative straight leg.  Skin: Warm, dry without rashes, lesions, ecchymosis. Neuro: Cranial nerves intact, reflexes equal bilaterally. Normal muscle tone, no cerebellar symptoms. Sensation intact.  Psych: Awake and oriented X 3, normal affect, Insight and Judgment appropriate.   EKG: WNL, IRBBB, no ST changes AORTA SCAN: has 1 area 3.7 not quite center, discussed AAA with patient, declines AB [place] at this time, will recheck in the office next year.   Vicie Mutters 9:33 AM [facility]

## 2014-12-27 NOTE — Patient Instructions (Addendum)
If you start the lipitor then STOP THE GEMFIBROZIL, you can not take these together.   Preventive Care for Adults A healthy lifestyle and preventive care can promote health and wellness. Preventive health guidelines for men include the following key practices:  A routine yearly physical is a good way to check with your health care provider about your health and preventative screening. It is a chance to share any concerns and updates on your health and to receive a thorough exam.  Visit your dentist for a routine exam and preventative care every 6 months. Brush your teeth twice a day and floss once a day. Good oral hygiene prevents tooth decay and gum disease.  The frequency of eye exams is based on your age, health, family medical history, use of contact lenses, and other factors. Follow your health care provider's recommendations for frequency of eye exams.  Eat a healthy diet. Foods such as vegetables, fruits, whole grains, low-fat dairy products, and lean protein foods contain the nutrients you need without too many calories. Decrease your intake of foods high in solid fats, added sugars, and salt. Eat the right amount of calories for you.Get information about a proper diet from your health care provider, if necessary.  Regular physical exercise is one of the most important things you can do for your health. Most adults should get at least 150 minutes of moderate-intensity exercise (any activity that increases your heart rate and causes you to sweat) each week. In addition, most adults need muscle-strengthening exercises on 2 or more days a week.  Maintain a healthy weight. The body mass index (BMI) is a screening tool to identify possible weight problems. It provides an estimate of body fat based on height and weight. Your health care provider can find your BMI and can help you achieve or maintain a healthy weight.For adults 20 years and older:  A BMI below 18.5 is considered underweight.  A  BMI of 18.5 to 24.9 is normal.  A BMI of 25 to 29.9 is considered overweight.  A BMI of 30 and above is considered obese.  Maintain normal blood lipids and cholesterol levels by exercising and minimizing your intake of saturated fat. Eat a balanced diet with plenty of fruit and vegetables. Blood tests for lipids and cholesterol should begin at age 14 and be repeated every 5 years. If your lipid or cholesterol levels are high, you are over 50, or you are at high risk for heart disease, you may need your cholesterol levels checked more frequently.Ongoing high lipid and cholesterol levels should be treated with medicines if diet and exercise are not working.  If you smoke, find out from your health care provider how to quit. If you do not use tobacco, do not start.  Lung cancer screening is recommended for adults aged 37-80 years who are at high risk for developing lung cancer because of a history of smoking. A yearly low-dose CT scan of the lungs is recommended for people who have at least a 30-pack-year history of smoking and are a current smoker or have quit within the past 15 years. A pack year of smoking is smoking an average of 1 pack of cigarettes a day for 1 year (for example: 1 pack a day for 30 years or 2 packs a day for 15 years). Yearly screening should continue until the smoker has stopped smoking for at least 15 years. Yearly screening should be stopped for people who develop a health problem that would prevent them  from having lung cancer treatment.  If you choose to drink alcohol, do not have more than 2 drinks per day. One drink is considered to be 12 ounces (355 mL) of beer, 5 ounces (148 mL) of wine, or 1.5 ounces (44 mL) of liquor.  Avoid use of street drugs. Do not share needles with anyone. Ask for help if you need support or instructions about stopping the use of drugs.  High blood pressure causes heart disease and increases the risk of stroke. Your blood pressure should be  checked at least every 1-2 years. Ongoing high blood pressure should be treated with medicines, if weight loss and exercise are not effective.  If you are 15-27 years old, ask your health care provider if you should take aspirin to prevent heart disease.  Diabetes screening involves taking a blood sample to check your fasting blood sugar level. Testing should be considered at a younger age or be carried out more frequently if you are overweight and have at least 1 risk factor for diabetes.  Colorectal cancer can be detected and often prevented. Most routine colorectal cancer screening begins at the age of 62 and continues through age 65. However, your health care provider may recommend screening at an earlier age if you have risk factors for colon cancer. On a yearly basis, your health care provider may provide home test kits to check for hidden blood in the stool. Use of a small camera at the end of a tube to directly examine the colon (sigmoidoscopy or colonoscopy) can detect the earliest forms of colorectal cancer. Talk to your health care provider about this at age 65, when routine screening begins. Direct exam of the colon should be repeated every 5-10 years through age 32, unless early forms of precancerous polyps or small growths are found.  Hepatitis C blood testing is recommended for all people born from [birth year] through 1965 and any individual with known risks for hepatitis C.  New guidelines recommend a once time screening for HIV.   Screening for abdominal aortic aneurysm (AAA)  by ultrasound is recommended for people who have history of high blood pressure or who are current or former smokers.  Healthy men should  receive prostate-specific antigen (PSA) blood tests as part of routine cancer screening. Talk with your health care provider about prostate cancer screening.  Testicular cancer screening is  recommended for adult males. Screening includes self-exam, a health care provider exam, and  other screening tests. Consult with your health care provider about any symptoms you have or any concerns you have about testicular cancer.  Use sunscreen. Apply sunscreen liberally and repeatedly throughout the day. You should seek shade when your shadow is shorter than you. Protect yourself by wearing long sleeves, pants, a wide-brimmed hat, and sunglasses year round, whenever you are outdoors.  Once a month, do a whole-body skin exam, using a mirror to look at the skin on your back. Tell your health care provider about new moles, moles that have irregular borders, moles that are larger than a pencil eraser, or moles that have changed in shape or color.  Stay current with required vaccines (immunizations).  Influenza vaccine. All adults should be immunized every year.  Tetanus, diphtheria, and acellular pertussis (Td, Tdap) vaccine. An adult who has not previously received Tdap or who does not know his vaccine status should receive 1 dose of Tdap. This initial dose should be followed by tetanus and diphtheria toxoids (Td) booster doses every 10 years. Adults with  an unknown or incomplete history of completing a 3-dose immunization series with Td-containing vaccines should begin or complete a primary immunization series including a Tdap dose. Adults should receive a Td booster every 10 years.  Zoster vaccine. One dose is recommended for adults aged 62 years or older unless certain conditions are present.    PREVNAR - Pneumococcal 13-valent conjugate (PCV13) vaccine. When indicated, a person who is uncertain of his immunization history and has no record of immunization should receive the PCV13 vaccine. An adult aged 76 years or older who has certain medical conditions and has not been previously immunized should receive 1 dose of PCV13 vaccine. This PCV13 should be followed with a dose of pneumococcal polysaccharide (PPSV23) vaccine. The PPSV23 vaccine dose should be obtained at least 8 weeks after  the dose of PCV13 vaccine. An adult aged 35 years or older who has certain medical conditions and previously received 1 or more doses of PPSV23 vaccine should receive 1 dose of PCV13. The PCV13 vaccine dose should be obtained 1 or more years after the last PPSV23 vaccine dose.    PNEUMOVAX - Pneumococcal polysaccharide (PPSV23) vaccine. When PCV13 is also indicated, PCV13 should be obtained first. All adults aged 42 years and older should be immunized. An adult younger than age 26 years who has certain medical conditions should be immunized. Any person who resides in a nursing home or long-term care facility should be immunized. An adult smoker should be immunized. People with an immunocompromised condition and certain other conditions should receive both PCV13 and PPSV23 vaccines. People with human immunodeficiency virus (HIV) infection should be immunized as soon as possible after diagnosis. Immunization during chemotherapy or radiation therapy should be avoided. Routine use of PPSV23 vaccine is not recommended for American Indians, [place]atives, or people younger than 65 years unless there are medical conditions that require PPSV23 vaccine. When indicated, people who have unknown immunization and have no record of immunization should receive PPSV23 vaccine. One-time revaccination 5 years after the first dose of PPSV23 is recommended for people aged 19-64 years who have chronic kidney failure, nephrotic syndrome, asplenia, or immunocompromised conditions. People who received 1-2 doses of PPSV23 before age 32 years should receive another dose of PPSV23 vaccine at age 25 years or later if at least 5 years have passed since the previous dose. Doses of PPSV23 are not needed for people immunized with PPSV23 at or after age 71 years.    Hepatitis A vaccine. Adults who wish to be protected from this disease, have certain high-risk conditions, work with hepatitis A-infected animals, work in hepatitis A research  labs, or travel to or work in countries with a high rate of hepatitis A should be immunized. Adults who were previously unvaccinated and who anticipate close contact with an international adoptee during the first 60 days after arrival in the [place] from a country with a high rate of hepatitis A should be immunized.    Hepatitis B vaccine. Adults should be immunized if they wish to be protected from this disease, have certain high-risk conditions, may be exposed to blood or other infectious body fluids, are household contacts or sex partners of hepatitis B positive people, are clients or workers in certain care facilities, or travel to or work in countries with a high rate of hepatitis B.   Preventive Service / Frequency   Ages 47 and over  Blood pressure check.  Lipid and cholesterol check.  Lung cancer screening. / Every year if you  are aged 27-80 years and have a 30-pack-year history of smoking and currently smoke or have quit within the past 15 years. Yearly screening is stopped once you have quit smoking for at least 15 years or develop a health problem that would prevent you from having lung cancer treatment.  Fecal occult blood test (FOBT) of stool. You may not have to do this test if you get a colonoscopy every 10 years.  Flexible sigmoidoscopy** or colonoscopy.** / Every 5 years for a flexible sigmoidoscopy or every 10 years for a colonoscopy beginning at age 75 and continuing until age 5.  Hepatitis C blood test.** / For all people born from [birth year] through 1965 and any individual with known risks for hepatitis C.  Abdominal aortic aneurysm (AAA) screening./ Screening current or former smokers or have Hypertension.  Skin self-exam. / Monthly.  Influenza vaccine. / Every year.  Tetanus, diphtheria, and acellular pertussis (Tdap/Td) vaccine.** / 1 dose of Td every 10 years.   Zoster vaccine.** / 1 dose for adults aged 51 years or older.         Pneumococcal 13-valent  conjugate (PCV13) vaccine.    Pneumococcal polysaccharide (PPSV23) vaccine.     Hepatitis A vaccine.** / Consult your health care provider.  Hepatitis B vaccine.** / Consult your health care provider. Screening for abdominal aortic aneurysm (AAA)  by ultrasound is recommended for people who have history of high blood pressure or who are current or former smokers.

## 2014-12-28 ENCOUNTER — Encounter: Payer: Self-pay | Admitting: Physician Assistant

## 2014-12-28 DIAGNOSIS — D696 Thrombocytopenia, unspecified: Secondary | ICD-10-CM | POA: Insufficient documentation

## 2014-12-28 LAB — HEMOGLOBIN A1C
Hgb A1c MFr Bld: 5.5 % (ref <5.7)
MEAN PLASMA GLUCOSE: 111 mg/dL (ref <117)

## 2014-12-28 LAB — BASIC METABOLIC PANEL WITH GFR
BUN: 15 mg/dL (ref 7–25)
CO2: 29 mmol/L (ref 20–31)
Calcium: 9.4 mg/dL (ref 8.6–10.3)
Chloride: 102 mmol/L (ref 98–110)
Creat: 0.86 mg/dL (ref 0.70–1.25)
GFR, Est African American: >89 mL/min (ref >=60)
GFR, Est Non African American: >89 mL/min (ref >=60)
GLUCOSE: 92 mg/dL (ref 65–99)
Potassium: 4.7 mmol/L (ref 3.5–5.3)
SODIUM: 141 mmol/L (ref 135–146)

## 2014-12-28 LAB — LIPID PANEL
CHOL/HDL RATIO: 4.2 ratio (ref <=5.0)
Cholesterol: 219 mg/dL — ABNORMAL HIGH (ref 125–200)
HDL: 52 mg/dL (ref >=40)
LDL Cholesterol: 132 mg/dL — ABNORMAL HIGH (ref <130)
TRIGLYCERIDES: 173 mg/dL — AB (ref <150)
VLDL: 35 mg/dL — AB (ref <30)

## 2014-12-28 LAB — URINALYSIS, ROUTINE W REFLEX MICROSCOPIC
BILIRUBIN URINE: NEGATIVE
GLUCOSE, UA: NEGATIVE
HGB URINE DIPSTICK: NEGATIVE
Ketones, ur: NEGATIVE
Leukocytes, UA: NEGATIVE
Nitrite: NEGATIVE
PROTEIN: NEGATIVE
Specific Gravity, Urine: 1.002 (ref 1.001–1.035)
pH: 6 (ref 5.0–8.0)

## 2014-12-28 LAB — INSULIN, FASTING: Insulin fasting, serum: 3 u[IU]/mL (ref 2.0–19.6)

## 2014-12-28 LAB — MICROALBUMIN / CREATININE URINE RATIO: CREATININE, URINE: 37.8 mg/dL

## 2014-12-28 LAB — MAGNESIUM: MAGNESIUM: 2.1 mg/dL (ref 1.5–2.5)

## 2014-12-28 LAB — HEPATIC FUNCTION PANEL
ALBUMIN: 4.2 g/dL (ref 3.6–5.1)
ALT: 14 U/L (ref 9–46)
AST: 19 U/L (ref 10–35)
Alkaline Phosphatase: 87 U/L (ref 40–115)
BILIRUBIN DIRECT: 0.2 mg/dL (ref <=0.2)
BILIRUBIN TOTAL: 1.1 mg/dL (ref 0.2–1.2)
Indirect Bilirubin: 0.9 mg/dL (ref 0.2–1.2)
Total Protein: 6.3 g/dL (ref 6.1–8.1)

## 2014-12-28 LAB — IRON AND TIBC
%SAT: 41 % (ref 20–55)
Iron: 143 ug/dL (ref 42–165)
TIBC: 346 ug/dL (ref 215–435)
UIBC: 203 ug/dL (ref 125–400)

## 2014-12-28 LAB — FERRITIN: Ferritin: 36 ng/mL (ref 22–322)

## 2014-12-28 LAB — VITAMIN B12: Vitamin B-12: 596 pg/mL (ref 211–911)

## 2014-12-28 LAB — TSH: TSH: 1.782 u[IU]/mL (ref 0.350–4.500)

## 2014-12-28 LAB — VITAMIN D 25 HYDROXY (VIT D DEFICIENCY, FRACTURES): VIT D 25 HYDROXY: 62 ng/mL (ref 30–100)

## 2015-10-10 ENCOUNTER — Other Ambulatory Visit: Payer: Self-pay | Admitting: Internal Medicine

## 2015-10-15 ENCOUNTER — Ambulatory Visit (INDEPENDENT_AMBULATORY_CARE_PROVIDER_SITE_OTHER): Payer: 59 | Admitting: Internal Medicine

## 2015-10-15 ENCOUNTER — Encounter: Payer: Self-pay | Admitting: Internal Medicine

## 2015-10-15 ENCOUNTER — Other Ambulatory Visit: Payer: Self-pay | Admitting: *Deleted

## 2015-10-15 VITALS — BP 130/74 | HR 76 | Temp 97.6°F | Resp 16 | Ht 72.0 in | Wt 157.0 lb

## 2015-10-15 DIAGNOSIS — I10 Essential (primary) hypertension: Secondary | ICD-10-CM

## 2015-10-15 DIAGNOSIS — J449 Chronic obstructive pulmonary disease, unspecified: Secondary | ICD-10-CM | POA: Diagnosis not present

## 2015-10-15 DIAGNOSIS — I2510 Atherosclerotic heart disease of native coronary artery without angina pectoris: Secondary | ICD-10-CM

## 2015-10-15 DIAGNOSIS — E785 Hyperlipidemia, unspecified: Secondary | ICD-10-CM | POA: Diagnosis not present

## 2015-10-15 DIAGNOSIS — Z79899 Other long term (current) drug therapy: Secondary | ICD-10-CM

## 2015-10-15 DIAGNOSIS — R7303 Prediabetes: Secondary | ICD-10-CM

## 2015-10-15 DIAGNOSIS — E559 Vitamin D deficiency, unspecified: Secondary | ICD-10-CM

## 2015-10-15 MED ORDER — BISOPROLOL-HYDROCHLOROTHIAZIDE 10-6.25 MG PO TABS: ORAL_TABLET | ORAL | Status: DC

## 2015-10-15 MED ORDER — FLUTICASONE FUROATE-VILANTEROL 100-25 MCG/INH IN AEPB: 1.0000 | INHALATION_SPRAY | Freq: Every day | RESPIRATORY_TRACT | Status: DC

## 2015-10-15 MED ORDER — DOXYCYCLINE HYCLATE 100 MG PO CAPS: ORAL_CAPSULE | ORAL | Status: AC

## 2015-10-15 NOTE — Progress Notes (Signed)
Patient ID: Michael [name], male   DOB: 1950-09-14, 65 y.o.   MRN: [id number]  [facility]                       Unk Pinto, M.D.        Uvaldo Bristle. Silverio Lay, P.A.-C       Starlyn Skeans, P.A.-C   [facility]                [address] [place] [id number] Telephone [phone] Telefax [phone] _________________________________________________________________________   This very nice 65 y.o. DWM presents for late follow up (last seen 12/2014) with Hypertension, ASCAD Hyperlipidemia, Pre-Diabetes and Vitamin D Deficiency. Patient relates recently his 17 yo son died with a MI and had hx/o CVA and also his 68 yo mother and a siser also recently died.    Patient is treated for HTN since circa 2005 & BP has been controlled at home. Today's BP: 130/74 mmHg. In 2005 he presented with ACS and had Stents and then in 2007 he had a negative Cardiolite and in 2010 had a negative ETT. Patient has had no complaints of any cardiac type chest pain, palpitations, dyspnea/orthopnea/PND, dizziness, claudication, or dependent edema.   Hyperlipidemia is not controlled with diet & intol Crestor. Patient denies myalgias or other med SE's. Last Lipids were not at goal with  Cholesterol 219*; HDL 52; LDL 132*; Triglycerides 173 on 12/27/2014.   Also, the patient has history of PreDiabetes with A1c 5.7% in 2010 and has had no symptoms of reactive hypoglycemia, diabetic polys, paresthesias or visual blurring.  Last A1c was 5.5% on 12/27/2014.    Further, the patient also has history of Vitamin D Deficiency of "25" in 2009 and supplements vitamin D without any suspected side-effects. Last vitamin D was 62 on 12/27/2014.  Medication Sig  . albuterolHFA inhaler Inhale 1-2 puffs into the lungs every 6 (six) hours as needed for wheezing or shortness of breath.  . SYMBICORT 160-4.5  inhaler Inhale 2 puffs into the lungs 2 (two)  times daily.  Marland Kitchen VITAMIN D 1000 UNITS tablet 1,000 Units. 4000 total a day  . Multiple Vitamin Take 1 tablet by mouth daily. Takes a vitamin pack daily  . saw palmetto 500 MG capsule Take 500 mg by mouth daily.  . bisoprolol-hctz (ZIAC) 10-6.25 TAKE ONE TABLET BY MOUTH ONCE DAILY FOR BLOOD PRESSURE  . gemfibrozil  600 MG tablet Take 1 tablet (600 mg total) by mouth 2 (two) times daily. (Patient not taking: Reported on 10/15/2015)   Allergies  Allergen Reactions  . Ace Inhibitors Cough  . Ciprofloxacin   . Fenofibrate     dizzy   PMHx:   Past Medical History  Diagnosis Date  . GERD (gastroesophageal reflux disease)   . BPH (benign prostatic hyperplasia)   . Vitamin D deficiency   . Prediabetes   . Hypogonadism male   . COPD (chronic obstructive pulmonary disease) ([place])     via CT lung  . Hypertension   . Hyperlipidemia   . Myocardial infarction [facility]) 2005    Per patient no stent or PTCA, just medical treatment   Immunization History  Administered Date(s) Administered  . DTaP 12/08/2010  . PPD Test 12/25/2013  . Pneumococcal-Unspecified 12/08/2010   Past Surgical History  Procedure Laterality Date  . Coronary angioplasty with stent  placement N/A 2005    Per patient no stent or PTCA, just medical treatment  . Prostate surgery      Biopsy X 3  . Amputation finger / thumb Left 1991    distal index  . Vasectomy     FHx:    Reviewed / unchanged  SHx:    Reviewed / unchanged  Systems Review:  Constitutional: Denies fever, chills, wt changes, headaches, insomnia, fatigue, night sweats, change in appetite. Eyes: Denies redness, blurred vision, diplopia, discharge, itchy, watery eyes.  ENT: Denies discharge, congestion, post nasal drip, epistaxis, sore throat, earache, hearing loss, dental pain, tinnitus, vertigo, sinus pain, snoring.  CV: Denies chest pain, palpitations, irregular heartbeat, syncope, dyspnea, diaphoresis, orthopnea, PND, claudication or edema. Respiratory:  denies cough, dyspnea, DOE, pleurisy, hoarseness, laryngitis, wheezing.  Gastrointestinal: Denies dysphagia, odynophagia, heartburn, reflux, water brash, abdominal pain or cramps, nausea, vomiting, bloating, diarrhea, constipation, hematemesis, melena, hematochezia  or hemorrhoids. Genitourinary: Denies dysuria, frequency, urgency, nocturia, hesitancy, discharge, hematuria or flank pain. Musculoskeletal: Denies arthralgias, myalgias, stiffness, jt. swelling, pain, limping or strain/sprain.  Skin: Denies pruritus, rash, hives, warts, acne, eczema or change in skin lesion(s). Neuro: No weakness, tremor, incoordination, spasms, paresthesia or pain. Psychiatric: Denies confusion, memory loss or sensory loss. Endo: Denies change in weight, skin or hair change.  Heme/Lymph: No excessive bleeding, bruising or enlarged lymph nodes.  Physical Exam  BP 130/74 mmHg  Pulse 76  Temp(Src) 97.6 F (36.4 C)  Resp 16  Ht 6' (1.829 m)  Wt 157 lb (71.215 kg)  BMI 21.29 kg/m2  Appears well nourished and in no distress.  Eyes: PERRLA, EOMs, conjunctiva no swelling or erythema. Sinuses: No frontal/maxillary tenderness ENT/Mouth: EAC's clear, TM's nl w/o erythema, bulging. Nares clear w/o erythema, swelling, exudates. Oropharynx clear without erythema or exudates. Oral hygiene is good. Tongue normal, non obstructing. Hearing intact.  Neck: Supple. Thyroid nl. Car 2+/2+ without bruits, nodes or JVD. Chest: Respirations nl with BS clear & equal w/o rales, rhonchi, wheezing or stridor.  Cor: Heart sounds normal w/ regular rate and rhythm without sig. murmurs, gallops, clicks, or rubs. Peripheral pulses normal and equal  without edema.  Abdomen: Soft & bowel sounds normal. Non-tender w/o guarding, rebound, hernias, masses, or organomegaly.  Lymphatics: Unremarkable.  Musculoskeletal: Full ROM all peripheral extremities, joint stability, 5/5 strength, and normal gait.  Skin: Warm, dry without exposed rashes,  lesions or ecchymosis apparent.  Neuro: Cranial nerves intact, reflexes equal bilaterally. Sensory-motor testing grossly intact. Tendon reflexes grossly intact.  Pysch: Alert & oriented x 3.  Insight and judgement nl & appropriate. No ideations.  Assessment and Plan:  1. Essential hypertension  - TSH  2. Hyperlipidemia  - Lipid panel - TSH  3. Prediabetes  - Hemoglobin A1c - Insulin, random  4. Vitamin D deficiency  - VITAMIN D 25 Hydroxy   5. Chronic obstructive pulmonary disease, unspecified COPD type ([place])   6. ASHD s/p Stent   7. Medication management  - CBC with Differential/Platelet - BASIC METABOLIC PANEL WITH GFR - Hepatic function panel - Magnesium   Recommended regular exercise, BP monitoring, weight control, and discussed med and SE's. Recommended labs to assess and monitor clinical status. Further disposition pending results of labs. Over 30 minutes of exam, counseling, chart review was performed

## 2015-10-15 NOTE — Patient Instructions (Signed)

## 2015-10-16 ENCOUNTER — Other Ambulatory Visit: Payer: Self-pay | Admitting: Internal Medicine

## 2015-10-16 DIAGNOSIS — E785 Hyperlipidemia, unspecified: Secondary | ICD-10-CM

## 2015-10-16 LAB — CBC WITH DIFFERENTIAL/PLATELET
BASOS ABS: 94 {cells}/uL (ref 0–200)
Basophils Relative: 1 %
EOS PCT: 1 %
Eosinophils Absolute: 94 cells/uL (ref 15–500)
HCT: 40.9 % (ref 38.5–50.0)
HEMOGLOBIN: 13.8 g/dL (ref 13.2–17.1)
LYMPHS ABS: 2350 {cells}/uL (ref 850–3900)
Lymphocytes Relative: 25 %
MCH: 32.5 pg (ref 27.0–33.0)
MCHC: 33.7 g/dL (ref 32.0–36.0)
MCV: 96.5 fL (ref 80.0–100.0)
MONO ABS: 564 {cells}/uL (ref 200–950)
MPV: 9.7 fL (ref 7.5–12.5)
Monocytes Relative: 6 %
NEUTROS ABS: 6298 {cells}/uL (ref 1500–7800)
Neutrophils Relative %: 67 %
Platelets: 127 10*3/uL — ABNORMAL LOW (ref 140–400)
RBC: 4.24 MIL/uL (ref 4.20–5.80)
RDW: 13.5 % (ref 11.0–15.0)
WBC: 9.4 10*3/uL (ref 3.8–10.8)

## 2015-10-16 LAB — LIPID PANEL
CHOLESTEROL: 216 mg/dL — AB (ref 125–200)
HDL: 53 mg/dL (ref >=40)
LDL CALC: 115 mg/dL (ref <130)
Total CHOL/HDL Ratio: 4.1 Ratio (ref <=5.0)
Triglycerides: 239 mg/dL — ABNORMAL HIGH (ref <150)
VLDL: 48 mg/dL — AB (ref <30)

## 2015-10-16 LAB — MAGNESIUM: MAGNESIUM: 2 mg/dL (ref 1.5–2.5)

## 2015-10-16 LAB — BASIC METABOLIC PANEL WITH GFR
BUN: 12 mg/dL (ref 7–25)
CHLORIDE: 106 mmol/L (ref 98–110)
CO2: 28 mmol/L (ref 20–31)
CREATININE: 1.04 mg/dL (ref 0.70–1.25)
Calcium: 8.8 mg/dL (ref 8.6–10.3)
GFR, Est African American: 86 mL/min (ref >=60)
GFR, Est Non African American: 74 mL/min (ref >=60)
GLUCOSE: 119 mg/dL — AB (ref 65–99)
POTASSIUM: 4 mmol/L (ref 3.5–5.3)
Sodium: 141 mmol/L (ref 135–146)

## 2015-10-16 LAB — HEPATIC FUNCTION PANEL
ALBUMIN: 3.9 g/dL (ref 3.6–5.1)
ALT: 16 U/L (ref 9–46)
AST: 19 U/L (ref 10–35)
Alkaline Phosphatase: 74 U/L (ref 40–115)
Bilirubin, Direct: 0.1 mg/dL (ref <=0.2)
Indirect Bilirubin: 0.5 mg/dL (ref 0.2–1.2)
Total Bilirubin: 0.6 mg/dL (ref 0.2–1.2)
Total Protein: 5.9 g/dL — ABNORMAL LOW (ref 6.1–8.1)

## 2015-10-16 LAB — VITAMIN D 25 HYDROXY (VIT D DEFICIENCY, FRACTURES): Vit D, 25-Hydroxy: 67 ng/mL (ref 30–100)

## 2015-10-16 LAB — HEMOGLOBIN A1C
HEMOGLOBIN A1C: 5.6 % (ref <5.7)
Mean Plasma Glucose: 114 mg/dL

## 2015-10-16 LAB — TSH: TSH: 0.92 mIU/L (ref 0.40–4.50)

## 2015-10-16 LAB — INSULIN, RANDOM: Insulin: 29.6 u[IU]/mL — ABNORMAL HIGH (ref 2.0–19.6)

## 2015-10-16 MED ORDER — ATORVASTATIN CALCIUM 80 MG PO TABS
ORAL_TABLET | ORAL | Status: DC
Start: 2015-10-16 — End: 2016-04-28

## 2016-01-15 ENCOUNTER — Encounter: Payer: Self-pay | Admitting: Internal Medicine

## 2016-01-15 ENCOUNTER — Ambulatory Visit (INDEPENDENT_AMBULATORY_CARE_PROVIDER_SITE_OTHER): Payer: 59 | Admitting: Internal Medicine

## 2016-01-15 VITALS — BP 124/76 | HR 60 | Temp 97.5°F | Resp 16 | Ht 72.0 in | Wt 156.4 lb

## 2016-01-15 DIAGNOSIS — E785 Hyperlipidemia, unspecified: Secondary | ICD-10-CM

## 2016-01-15 DIAGNOSIS — Z0001 Encounter for general adult medical examination with abnormal findings: Secondary | ICD-10-CM

## 2016-01-15 DIAGNOSIS — I10 Essential (primary) hypertension: Secondary | ICD-10-CM

## 2016-01-15 DIAGNOSIS — R5383 Other fatigue: Secondary | ICD-10-CM

## 2016-01-15 DIAGNOSIS — E559 Vitamin D deficiency, unspecified: Secondary | ICD-10-CM

## 2016-01-15 DIAGNOSIS — Z136 Encounter for screening for cardiovascular disorders: Secondary | ICD-10-CM

## 2016-01-15 DIAGNOSIS — I2510 Atherosclerotic heart disease of native coronary artery without angina pectoris: Secondary | ICD-10-CM

## 2016-01-15 DIAGNOSIS — R972 Elevated prostate specific antigen [PSA]: Secondary | ICD-10-CM | POA: Insufficient documentation

## 2016-01-15 DIAGNOSIS — Z1211 Encounter for screening for malignant neoplasm of colon: Secondary | ICD-10-CM

## 2016-01-15 DIAGNOSIS — Z79899 Other long term (current) drug therapy: Secondary | ICD-10-CM

## 2016-01-15 DIAGNOSIS — R7303 Prediabetes: Secondary | ICD-10-CM

## 2016-01-15 DIAGNOSIS — Z0000 Encounter for general adult medical examination without abnormal findings: Secondary | ICD-10-CM | POA: Diagnosis not present

## 2016-01-15 LAB — HEPATIC FUNCTION PANEL
ALT: 18 U/L (ref 9–46)
AST: 23 U/L (ref 10–35)
Albumin: 3.8 g/dL (ref 3.6–5.1)
Alkaline Phosphatase: 77 U/L (ref 40–115)
BILIRUBIN DIRECT: 0.1 mg/dL (ref <=0.2)
BILIRUBIN INDIRECT: 0.9 mg/dL (ref 0.2–1.2)
BILIRUBIN TOTAL: 1 mg/dL (ref 0.2–1.2)
Total Protein: 6.1 g/dL (ref 6.1–8.1)

## 2016-01-15 LAB — CBC WITH DIFFERENTIAL/PLATELET
BASOS ABS: 92 {cells}/uL (ref 0–200)
Basophils Relative: 1 %
EOS PCT: 1 %
Eosinophils Absolute: 92 cells/uL (ref 15–500)
HCT: 40.9 % (ref 38.5–50.0)
HEMOGLOBIN: 13.8 g/dL (ref 13.2–17.1)
LYMPHS ABS: 2208 {cells}/uL (ref 850–3900)
Lymphocytes Relative: 24 %
MCH: 32.3 pg (ref 27.0–33.0)
MCHC: 33.7 g/dL (ref 32.0–36.0)
MCV: 95.8 fL (ref 80.0–100.0)
MONOS PCT: 10 %
MPV: 9.8 fL (ref 7.5–12.5)
Monocytes Absolute: 920 cells/uL (ref 200–950)
NEUTROS ABS: 5888 {cells}/uL (ref 1500–7800)
NEUTROS PCT: 64 %
PLATELETS: 125 10*3/uL — AB (ref 140–400)
RBC: 4.27 MIL/uL (ref 4.20–5.80)
RDW: 13.8 % (ref 11.0–15.0)
WBC: 9.2 10*3/uL (ref 3.8–10.8)

## 2016-01-15 LAB — BASIC METABOLIC PANEL WITH GFR
BUN: 16 mg/dL (ref 7–25)
CALCIUM: 9.1 mg/dL (ref 8.6–10.3)
CO2: 25 mmol/L (ref 20–31)
CREATININE: 0.91 mg/dL (ref 0.70–1.25)
Chloride: 104 mmol/L (ref 98–110)
GFR, EST NON AFRICAN AMERICAN: 88 mL/min (ref >=60)
Glucose, Bld: 89 mg/dL (ref 65–99)
Potassium: 4.2 mmol/L (ref 3.5–5.3)
SODIUM: 140 mmol/L (ref 135–146)

## 2016-01-15 LAB — IRON AND TIBC
%SAT: 38 % (ref 15–60)
Iron: 125 ug/dL (ref 50–180)
TIBC: 329 ug/dL (ref 250–425)
UIBC: 204 ug/dL (ref 125–400)

## 2016-01-15 LAB — VITAMIN B12: Vitamin B-12: 472 pg/mL (ref 200–1100)

## 2016-01-15 LAB — LIPID PANEL
CHOL/HDL RATIO: 4.9 ratio (ref <=5.0)
CHOLESTEROL: 241 mg/dL — AB (ref 125–200)
HDL: 49 mg/dL (ref >=40)
LDL Cholesterol: 148 mg/dL — ABNORMAL HIGH (ref <130)
TRIGLYCERIDES: 222 mg/dL — AB (ref <150)
VLDL: 44 mg/dL — AB (ref <30)

## 2016-01-15 LAB — MAGNESIUM: MAGNESIUM: 2.1 mg/dL (ref 1.5–2.5)

## 2016-01-15 LAB — TSH: TSH: 1.57 mIU/L (ref 0.40–4.50)

## 2016-01-15 NOTE — Patient Instructions (Signed)

## 2016-01-15 NOTE — Progress Notes (Signed)
[facility]   Unk Pinto, M.D.    Uvaldo Bristle. Silverio Lay, P.A.-C      Starlyn Skeans, P.A.-C   [address] [place] [id number] Telephone [phone] Telefax [phone]  Annual  Screening/Preventative Visit And Comprehensive Evaluation & Examination     This very nice 65y.o.SepWM presents for a Wellness/Preventative Visit & comprehensive evaluation and management of multiple medical co-morbidities.  Patient has been followed for HTN, T2_NIDDM  Prediabetes, Hyperlipidemia and Vitamin D Deficiency.     HTN predates since 2004 and in 2005 had PTCA/Stents with f/u negative Cardiolite in 2007 and neg ETT in 2010. Patient's BP has been controlled at home.Today's BP is 124/76. Patient denies any cardiac symptoms as chest pain, palpitations, shortness of breath, dizziness or ankle swelling.     Patient's hyperlipidemia is not controlled with diet and as he infrequently takes his Atorvastatin. Patient denies myalgias or other medication SE's. Last lipids were not at goal: Lab Results  Component Value Date   CHOL 216 (H) 10/15/2015   HDL 53 10/15/2015   LDLCALC 115 10/15/2015   TRIG 239 (H) 10/15/2015   CHOLHDL 4.1 10/15/2015      Patient has prediabetes circa 2010 with A1c 5.7% and subsequent normalizing with improved diet. Patient denies reactive hypoglycemic symptoms, visual blurring, diabetic polys or paresthesias. Last A1c was normal & at goal: Lab Results  Component Value Date   HGBA1C 5.6 10/15/2015       Finally, patient has history of Vitamin D Deficiency with Vit D of "25" in   In 2009. and last vitamin D was  Lab Results  Component Value Date   VD25OH 67 10/15/2015   Current Outpatient Prescriptions on File Prior to Visit  Medication Sig  . albuterol (PROVENTIL HFA;VENTOLIN HFA) 108 (90 BASE) MCG/ACT inhaler Inhale 1-2 puffs into the lungs every 6 (six)  hours as needed for wheezing or shortness of breath.  Marland Kitchen aspirin EC 81 MG tablet Take 81 mg by mouth daily.  Marland Kitchen atorvastatin (LIPITOR) 80 MG tablet Take 1/2 to 1 tablet daily or as directed   for Cholesterol  . b complex vitamins tablet Take 1 tablet by mouth daily.  . bisoprolol-hydrochlorothiazide (ZIAC) 10-6.25 MG tablet TAKE ONE TABLET BY MOUTH ONCE DAILY FOR BLOOD PRESSURE  . cholecalciferol (VITAMIN D) 1000 UNITS tablet 1,000 Units. 4000 total a day  . fluticasone furoate-vilanterol (BREO ELLIPTA) 100-25 MCG/INH AEPB Inhale 1 puff into the lungs daily.  . Ginger, Zingiber officinalis, (GINGER PO) Take 1 capsule by mouth daily.  . Multiple Vitamin (MULTIVITAMIN) tablet Take 1 tablet by mouth daily. Takes a vitamin pack daily  . OVER THE COUNTER MEDICATION Fish oil 1000 mg 1 capsule daily.  . saw palmetto 500 MG capsule Take 500 mg by mouth daily.   No current facility-administered medications on file prior to visit.    Allergies  Allergen Reactions  . Ace Inhibitors Cough  . Ciprofloxacin   . Fenofibrate     dizzy   Past Medical History:  Diagnosis Date  . BPH (benign prostatic hyperplasia)   . COPD (chronic obstructive pulmonary disease) ([place])    via CT lung  . GERD (gastroesophageal reflux disease)   . Hyperlipidemia   . Hypertension   . Hypogonadism male   .  Myocardial infarction [facility]) 2005   Per patient no stent or PTCA, just medical treatment  . Prediabetes   . Vitamin D deficiency    Health Maintenance  Topic Date Due  . TETANUS/TDAP  07/11/1968  . ZOSTAVAX  07/11/2009  . PNA vac Low Risk Adult (1 of 2 - PCV13) 07/11/2014  . INFLUENZA VACCINE  12/23/2015  . COLONOSCOPY  11/08/2022  . Hepatitis C Screening  Completed   Immunization History  Administered Date(s) Administered  . DTaP 12/08/2010  . PPD Test 12/25/2013  . Pneumococcal-Unspecified 12/08/2010   Past Surgical History:  Procedure Laterality Date  . 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 CORONARY ANGIOPLASTY WITH STENT PLACEMENT N/A 2005   Per patient no stent or PTCA, just medical treatment  . PROSTATE SURGERY     Biopsy X 3  . VASECTOMY     Family History  Problem Relation Age of Onset  . Cancer Father 35    lymphoma  . Cancer Maternal Aunt     Lung  . Cancer Maternal Uncle     Lung  . Cancer Maternal Uncle     Lung   Social History   Social History  . Marital status: Seperated   Occupational History  . Corporate treasurer   Social History Main Topics  . Smoking status: Current Every Day Smoker    Packs/day: 1.00    Years: 36.00    Types: Cigarettes  . Smokeless tobacco: Never Used  . Alcohol use 3.0 oz/week    5 Cans of beer per week  . Drug use: No  . Sexual activity: Not on file    ROS Constitutional: Denies fever, chills, weight loss/gain, headaches, insomnia,  night sweats or change in appetite. Does c/o fatigue. Eyes: Denies redness, blurred vision, diplopia, discharge, itchy or watery eyes.  ENT: Denies discharge, congestion, post nasal drip, epistaxis, sore throat, earache, hearing loss, dental pain, Tinnitus, Vertigo, Sinus pain or snoring.  Cardio: Denies chest pain, palpitations, irregular heartbeat, syncope, dyspnea, diaphoresis, orthopnea, PND, claudication or edema Respiratory: denies cough, dyspnea, DOE, pleurisy, hoarseness, laryngitis or wheezing.  Gastrointestinal: Denies dysphagia, heartburn, reflux, water brash, pain, cramps, nausea, vomiting, bloating, diarrhea, constipation, hematemesis, melena, hematochezia, jaundice or hemorrhoids Genitourinary: Denies dysuria, frequency, urgency, nocturia, hesitancy, discharge, hematuria or flank pain Musculoskeletal: Denies arthralgia, myalgia, stiffness, Jt. Swelling, pain, limp or strain/sprain. Denies Falls. Skin: Denies puritis, rash, hives, warts, acne, eczema or change in skin lesion Neuro: No weakness, tremor, incoordination, spasms, paresthesia or pain Psychiatric: Denies  confusion, memory loss or sensory loss. Denies Depression. Endocrine: Denies change in weight, skin, hair change, nocturia, and paresthesia, diabetic polys, visual blurring or hyper / hypo glycemic episodes.  Heme/Lymph: No excessive bleeding, bruising or enlarged lymph nodes.  Physical Exam  BP 124/76   Pulse 60   Temp 97.5 F (36.4 C)   Resp 16   Ht 6' (1.829 m)   Wt 156 lb 6.4 oz (70.9 kg)   BMI 21.21 kg/m   General Appearance: Well nourished, in no apparent distress.  Eyes: PERRLA, EOMs, conjunctiva no swelling or erythema, normal fundi and vessels. Sinuses: No frontal/maxillary tenderness ENT/Mouth: EACs patent / TMs  nl. Nares clear without erythema, swelling, mucoid exudates. Oral hygiene is good. No erythema, swelling, or exudate. Tongue normal, non-obstructing. Tonsils not swollen or erythematous. Hearing normal.  Neck: Supple, thyroid normal. No bruits, nodes or JVD. Respiratory: Respiratory effort normal.  BS equal and clear bilateral without rales, rhonci,  wheezing or stridor. Cardio: Heart sounds are normal with regular rate and rhythm and no murmurs, rubs or gallops. Peripheral pulses are normal and equal bilaterally without edema. No aortic or femoral bruits. Chest: symmetric with normal excursions and percussion.  Abdomen: Soft, with Nl bowel sounds. Nontender, no guarding, rebound, hernias, masses, or organomegaly.  Lymphatics: Non tender without lymphadenopathy.  Genitourinary: No hernias.Testes nl. DRE - prostate nl for age - smooth & firm w/o nodules. Musculoskeletal: Full ROM all peripheral extremities, joint stability, 5/5 strength, and normal gait. Skin: Warm and dry without rashes, lesions, cyanosis, clubbing or  ecchymosis.  Neuro: Cranial nerves intact, reflexes equal bilaterally. Normal muscle tone, no cerebellar symptoms. Sensation intact.  Pysch: Alert and oriented X 3 with normal affect, insight and judgment appropriate.   Assessment and Plan  1.  Annual Preventative/Screening Exam   - Microalbumin / creatinine urine ratio - EKG 12-Lead - [place], RETROPERITNL ABD,  LTD - POC Hemoccult Bld/Stl - Urinalysis, Routine w reflex microscopic  - Vitamin B12 - Iron and TIBC - Testosterone - CBC with Differential/Platelet - BASIC METABOLIC PANEL WITH GFR - Hepatic function panel - Magnesium - Lipid panel - TSH - Hemoglobin A1c - Insulin, random - VITAMIN D 25 Hydroxy   2. Essential hypertension  - Microalbumin / creatinine urine ratio - EKG 12-Lead - [place], RETROPERITNL ABD,  LTD - TSH  3. Hyperlipidemia  - EKG 12-Lead - [place], RETROPERITNL ABD,  LTD - Lipid panel - TSH  4. Prediabetes  - EKG 12-Lead - [place], RETROPERITNL ABD,  LTD - Hemoglobin A1c - Insulin, random  5. Vitamin D deficiency  - VITAMIN D 25 Hydroxy   6. Elevated PSA  - PSA, total and free; Future  7. ASHD s/p Stent   8. Screening for ischemic heart disease  - EKG 12-Lead  9. Screening for AAA (aortic abdominal aneurysm)  - [place], RETROPERITNL ABD,  LTD  10. Colon cancer screening  - POC Hemoccult Bld/Stl   11. Other fatigue  - Vitamin B12 - Iron and TIBC - Testosterone - CBC with Differential/Platelet - TSH  12. Medication management  - Urinalysis, Routine w reflex microscopic - CBC with Differential/Platelet - BASIC METABOLIC PANEL WITH GFR - Hepatic function panel - Magnesium     Continue prudent diet as discussed, weight control, BP monitoring, regular exercise, and medications as discussed.  Discussed med effects and SE's. Routine screening labs and tests as requested with regular follow-up as recommended. Over 40 minutes of exam, counseling, chart review and high complex critical decision making was performed

## 2016-01-16 LAB — URINALYSIS, ROUTINE W REFLEX MICROSCOPIC
Bilirubin Urine: NEGATIVE
GLUCOSE, UA: NEGATIVE
HGB URINE DIPSTICK: NEGATIVE
KETONES UR: NEGATIVE
Leukocytes, UA: NEGATIVE
Nitrite: NEGATIVE
PH: 6 (ref 5.0–8.0)
Protein, ur: NEGATIVE
SPECIFIC GRAVITY, URINE: 1.021 (ref 1.001–1.035)

## 2016-01-16 LAB — PSA, TOTAL AND FREE
PSA, FREE: 1.9 ng/mL
PSA, TOTAL: 12.3 ng/mL — AB (ref <=4.0)

## 2016-01-16 LAB — MICROALBUMIN / CREATININE URINE RATIO
CREATININE, URINE: 139 mg/dL (ref 20–370)
Microalb Creat Ratio: 2 mcg/mg creat (ref <30)
Microalb, Ur: 0.3 mg/dL

## 2016-01-16 LAB — INSULIN, RANDOM: Insulin: 5.1 u[IU]/mL (ref 2.0–19.6)

## 2016-01-16 LAB — VITAMIN D 25 HYDROXY (VIT D DEFICIENCY, FRACTURES): VIT D 25 HYDROXY: 57 ng/mL (ref 30–100)

## 2016-01-16 LAB — TESTOSTERONE: TESTOSTERONE: 210 ng/dL — AB (ref 250–827)

## 2016-01-16 LAB — HEMOGLOBIN A1C
HEMOGLOBIN A1C: 5.3 % (ref <5.7)
Mean Plasma Glucose: 105 mg/dL

## 2016-04-28 ENCOUNTER — Encounter: Payer: Self-pay | Admitting: Internal Medicine

## 2016-04-28 ENCOUNTER — Ambulatory Visit (INDEPENDENT_AMBULATORY_CARE_PROVIDER_SITE_OTHER): Payer: 59 | Admitting: Internal Medicine

## 2016-04-28 VITALS — BP 128/62 | HR 62 | Temp 98.0°F | Resp 16 | Ht 72.0 in | Wt 155.0 lb

## 2016-04-28 DIAGNOSIS — E559 Vitamin D deficiency, unspecified: Secondary | ICD-10-CM

## 2016-04-28 DIAGNOSIS — J449 Chronic obstructive pulmonary disease, unspecified: Secondary | ICD-10-CM | POA: Diagnosis not present

## 2016-04-28 DIAGNOSIS — I10 Essential (primary) hypertension: Secondary | ICD-10-CM

## 2016-04-28 DIAGNOSIS — F17200 Nicotine dependence, unspecified, uncomplicated: Secondary | ICD-10-CM

## 2016-04-28 DIAGNOSIS — I2510 Atherosclerotic heart disease of native coronary artery without angina pectoris: Secondary | ICD-10-CM | POA: Diagnosis not present

## 2016-04-28 DIAGNOSIS — D696 Thrombocytopenia, unspecified: Secondary | ICD-10-CM

## 2016-04-28 DIAGNOSIS — E782 Mixed hyperlipidemia: Secondary | ICD-10-CM

## 2016-04-28 DIAGNOSIS — Z79899 Other long term (current) drug therapy: Secondary | ICD-10-CM

## 2016-04-28 DIAGNOSIS — R7303 Prediabetes: Secondary | ICD-10-CM

## 2016-04-28 LAB — CBC WITH DIFFERENTIAL/PLATELET
BASOS ABS: 86 {cells}/uL (ref 0–200)
Basophils Relative: 1 %
EOS PCT: 2 %
Eosinophils Absolute: 172 cells/uL (ref 15–500)
HCT: 43.2 % (ref 38.5–50.0)
HEMOGLOBIN: 14.4 g/dL (ref 13.2–17.1)
Lymphocytes Relative: 27 %
Lymphs Abs: 2322 cells/uL (ref 850–3900)
MCH: 32.6 pg (ref 27.0–33.0)
MCHC: 33.3 g/dL (ref 32.0–36.0)
MCV: 97.7 fL (ref 80.0–100.0)
MONOS PCT: 6 %
MPV: 9.6 fL (ref 7.5–12.5)
Monocytes Absolute: 516 cells/uL (ref 200–950)
NEUTROS PCT: 64 %
Neutro Abs: 5504 cells/uL (ref 1500–7800)
PLATELETS: 102 10*3/uL — AB (ref 140–400)
RBC: 4.42 MIL/uL (ref 4.20–5.80)
RDW: 13.2 % (ref 11.0–15.0)
WBC: 8.6 10*3/uL (ref 3.8–10.8)

## 2016-04-28 LAB — BASIC METABOLIC PANEL WITH GFR
BUN: 17 mg/dL (ref 7–25)
CALCIUM: 8.8 mg/dL (ref 8.6–10.3)
CO2: 26 mmol/L (ref 20–31)
CREATININE: 0.93 mg/dL (ref 0.70–1.25)
Chloride: 104 mmol/L (ref 98–110)
GFR, Est African American: >89 mL/min (ref >=60)
GFR, Est Non African American: 85 mL/min (ref >=60)
Glucose, Bld: 108 mg/dL — ABNORMAL HIGH (ref 65–99)
Potassium: 4.1 mmol/L (ref 3.5–5.3)
SODIUM: 140 mmol/L (ref 135–146)

## 2016-04-28 LAB — LIPID PANEL
CHOLESTEROL: 226 mg/dL — AB (ref <200)
HDL: 46 mg/dL (ref >40)
LDL Cholesterol: 136 mg/dL — ABNORMAL HIGH (ref <100)
TRIGLYCERIDES: 218 mg/dL — AB (ref <150)
Total CHOL/HDL Ratio: 4.9 Ratio (ref <5.0)
VLDL: 44 mg/dL — AB (ref <30)

## 2016-04-28 LAB — HEPATIC FUNCTION PANEL
ALBUMIN: 3.9 g/dL (ref 3.6–5.1)
ALT: 14 U/L (ref 9–46)
AST: 20 U/L (ref 10–35)
Alkaline Phosphatase: 72 U/L (ref 40–115)
BILIRUBIN DIRECT: 0.2 mg/dL (ref <=0.2)
Indirect Bilirubin: 0.9 mg/dL (ref 0.2–1.2)
TOTAL PROTEIN: 6 g/dL — AB (ref 6.1–8.1)
Total Bilirubin: 1.1 mg/dL (ref 0.2–1.2)

## 2016-04-28 LAB — TSH: TSH: 1.37 m[IU]/L (ref 0.40–4.50)

## 2016-04-28 NOTE — Progress Notes (Signed)
Assessment and Plan:  Hypertension:  -Continue medication,  -monitor blood pressure at home.  -Continue DASH diet.   -Reminder to go to the ER if any CP, SOB, nausea, dizziness, severe HA, changes vision/speech, left arm numbness and tingling, and jaw pain.  Cholesterol: -Continue diet and exercise.  -Check cholesterol.   Pre-diabetes: -Continue diet and exercise.  -Check A1C  Vitamin D Def: -check level -continue medications.   COPD -cont breo -cont albuterol  Tobacco abuse -discussed quitting -patient no interested in quitting at this time Continue diet and meds as discussed. Further disposition pending results of labs.  HPI 65 y.o. male  presents for 3 month follow up with hypertension, hyperlipidemia, prediabetes and vitamin D.   His blood pressure has been controlled at home, today their BP is BP: 128/62.   He does not workout. He denies chest pain, shortness of breath, dizziness.   He is on cholesterol medication and denies myalgias. His cholesterol is not at goal. The cholesterol last visit was:   Lab Results  Component Value Date   CHOL 241 (H) 01/15/2016   HDL 49 01/15/2016   LDLCALC 148 (H) 01/15/2016   TRIG 222 (H) 01/15/2016   CHOLHDL 4.9 01/15/2016     He has been working on diet and exercise for prediabetes, and denies foot ulcerations, hyperglycemia, hypoglycemia , increased appetite, nausea, paresthesia of the feet, polydipsia, polyuria, visual disturbances, vomiting and weight loss. Last A1C in the office was:  Lab Results  Component Value Date   HGBA1C 5.3 01/15/2016    Patient is on Vitamin D supplement.  Lab Results  Component Value Date   VD25OH 9 01/15/2016      He reports that he has been doing okay.  He has been following with the [place].  He reports that they sent him Advair.  He reports that it bothered his throat so he went back to the [facility].    Current Medications:  Current Outpatient Prescriptions on File Prior to Visit  Medication  Sig Dispense Refill  . albuterol (PROVENTIL HFA;VENTOLIN HFA) 108 (90 BASE) MCG/ACT inhaler Inhale 1-2 puffs into the lungs every 6 (six) hours as needed for wheezing or shortness of breath. 18 g 2  . aspirin EC 81 MG tablet Take 81 mg by mouth daily.    Marland Kitchen b complex vitamins tablet Take 1 tablet by mouth daily.    . bisoprolol-hydrochlorothiazide (ZIAC) 10-6.25 MG tablet TAKE ONE TABLET BY MOUTH ONCE DAILY FOR BLOOD PRESSURE 90 tablet 1  . cholecalciferol (VITAMIN D) 1000 UNITS tablet 1,000 Units. 4000 total a day    . fluticasone furoate-vilanterol (BREO ELLIPTA) 100-25 MCG/INH AEPB Inhale 1 puff into the lungs daily. 60 each 6  . Ginger, Zingiber officinalis, (GINGER PO) Take 1 capsule by mouth daily.    . Multiple Vitamin (MULTIVITAMIN) tablet Take 1 tablet by mouth daily. Takes a vitamin pack daily    . OVER THE COUNTER MEDICATION Fish oil 1000 mg 1 capsule daily.    . saw palmetto 500 MG capsule Take 500 mg by mouth daily.     No current facility-administered medications on file prior to visit.     Medical History:  Past Medical History:  Diagnosis Date  . BPH (benign prostatic hyperplasia)   . COPD (chronic obstructive pulmonary disease) ([place])    via CT lung  . GERD (gastroesophageal reflux disease)   . Hyperlipidemia   . Hypertension   . Hypogonadism male   . Myocardial infarction 2005  Per patient no stent or PTCA, just medical treatment  . Prediabetes   . Vitamin D deficiency     Allergies:  Allergies  Allergen Reactions  . Ace Inhibitors Cough  . Ciprofloxacin   . Fenofibrate     dizzy     Review of Systems:  Review of Systems  Constitutional: Negative for chills, fever and malaise/fatigue.  HENT: Negative for congestion, ear pain and sore throat.   Eyes: Negative.   Respiratory: Negative for cough, shortness of breath and wheezing.   Cardiovascular: Negative for chest pain, palpitations and leg swelling.  Gastrointestinal: Negative for abdominal pain, blood  in stool, constipation, diarrhea, heartburn and melena.  Genitourinary: Negative.   Skin: Negative.   Neurological: Negative for dizziness, sensory change, loss of consciousness and headaches.  Psychiatric/Behavioral: Negative for depression. The patient is not nervous/anxious and does not have insomnia.     Family history- Review and unchanged  Social history- Review and unchanged  Physical Exam: BP 128/62   Pulse 62   Temp 98 F (36.7 C) (Temporal)   Resp 16   Ht 6' (1.829 m)   Wt 155 lb (70.3 kg)   BMI 21.02 kg/m  Wt Readings from Last 3 Encounters:  04/28/16 155 lb (70.3 kg)  01/15/16 156 lb 6.4 oz (70.9 kg)  10/15/15 157 lb (71.2 kg)    General Appearance: Well nourished well developed, in no apparent distress. Eyes: PERRLA, EOMs, conjunctiva no swelling or erythema ENT/Mouth: Ear canals normal without obstruction, swelling, erythma, discharge.  TMs normal bilaterally.  Oropharynx moist, clear, without exudate, or postoropharyngeal swelling. Neck: Supple, thyroid normal,no cervical adenopathy  Respiratory: Respiratory effort normal, Breath sounds clear A&P without rhonchi, wheeze, or rale.  No retractions, no accessory usage. Cardio: RRR with no MRGs. Brisk peripheral pulses without edema.  Abdomen: Soft, + BS,  Non tender, no guarding, rebound, hernias, masses. Musculoskeletal: Full ROM, 5/5 strength, Normal gait Skin: Warm, dry without rashes, lesions, ecchymosis.  Neuro: Awake and oriented X 3, Cranial nerves intact. Normal muscle tone, no cerebellar symptoms. Psych: Normal affect, Insight and Judgment appropriate.    Starlyn Skeans, PA-C 9:05 AM [facility]

## 2016-04-28 NOTE — Patient Instructions (Signed)
Ezetimibe Tablets  What is this medicine?  EZETIMIBE (ez ET i mibe) blocks the absorption of cholesterol from the stomach. It can help lower blood cholesterol for patients who are at risk of getting heart disease or a stroke. It is only for patients whose cholesterol level is not controlled by diet.  This medicine may be used for other purposes; ask your health care provider or pharmacist if you have questions.  COMMON BRAND NAME(S): Zetia  What should I tell my health care provider before I take this medicine?  They need to know if you have any of these conditions:  -liver disease  -an unusual or allergic reaction to ezetimibe, medicines, foods, dyes, or preservatives  -pregnant or trying to get pregnant  -breast-feeding  How should I use this medicine?  Take this medicine by mouth with a glass of water. Follow the directions on the prescription label. This medicine can be taken with or without food. Take your doses at regular intervals. Do not take your medicine more often than directed.  Talk to your pediatrician regarding the use of this medicine in children. Special care may be needed.  Overdosage: If you think you have taken too much of this medicine contact a poison control center or emergency room at once.  NOTE: This medicine is only for you. Do not share this medicine with others.  What if I miss a dose?  If you miss a dose, take it as soon as you can. If it is almost time for your next dose, take only that dose. Do not take double or extra doses.  What may interact with this medicine?  Do not take this medicine with any of the following medications:  -fenofibrate  -gemfibrozil  This medicine may also interact with the following medications:  -antacids  -cyclosporine  -herbal medicines like red yeast rice  -other medicines to lower cholesterol or triglycerides  This list may not describe all possible interactions. Give your health care provider a list of all the medicines, herbs, non-prescription drugs, or  dietary supplements you use. Also tell them if you smoke, drink alcohol, or use illegal drugs. Some items may interact with your medicine.  What should I watch for while using this medicine?  Visit your doctor or health care professional for regular checks on your progress. You will need to have your cholesterol levels checked. If you are also taking some other cholesterol medicines, you will also need to have tests to make sure your liver is working properly.  Tell your doctor or health care professional if you get any unexplained muscle pain, tenderness, or weakness, especially if you also have a fever and tiredness.  You need to follow a low-cholesterol, low-fat diet while you are taking this medicine. This will decrease your risk of getting heart and blood vessel disease. Exercising and avoiding alcohol and smoking can also help. Ask your doctor or dietician for advice.  What side effects may I notice from receiving this medicine?  Side effects that you should report to your doctor or health care professional as soon as possible:  -allergic reactions like skin rash, itching or hives, swelling of the face, lips, or tongue  -dark yellow or brown urine  -unusually weak or tired  -yellowing of the skin or eyes  Side effects that usually do not require medical attention (report to your doctor or health care professional if they continue or are bothersome):  -diarrhea  -dizziness  -headache  -stomach upset or pain    temperature between 15 and 30 degrees C (59 and 86 degrees F). Protect from moisture. Keep container tightly closed. Throw away any unused medicine after the expiration date. NOTE: This sheet is a summary. It may not cover all possible information. If you have  questions about this medicine, talk to your doctor, pharmacist, or health care provider.  2017 Elsevier/Gold Standard (2011-11-15 15:39:09)

## 2016-04-29 ENCOUNTER — Other Ambulatory Visit: Payer: Self-pay | Admitting: *Deleted

## 2016-04-29 LAB — HEMOGLOBIN A1C
Hgb A1c MFr Bld: 5.3 % (ref <5.7)
MEAN PLASMA GLUCOSE: 105 mg/dL

## 2016-04-29 MED ORDER — EZETIMIBE 10 MG PO TABS: 10.0000 mg | ORAL_TABLET | Freq: Every day | ORAL | 11 refills | Status: DC

## 2016-06-15 ENCOUNTER — Other Ambulatory Visit: Payer: Self-pay | Admitting: *Deleted

## 2016-06-15 MED ORDER — FLUTICASONE FUROATE-VILANTEROL 100-25 MCG/INH IN AEPB: 1.0000 | INHALATION_SPRAY | Freq: Every day | RESPIRATORY_TRACT | 6 refills | Status: DC

## 2016-07-30 ENCOUNTER — Ambulatory Visit (INDEPENDENT_AMBULATORY_CARE_PROVIDER_SITE_OTHER): Payer: 59 | Admitting: Internal Medicine

## 2016-07-30 ENCOUNTER — Encounter: Payer: Self-pay | Admitting: Internal Medicine

## 2016-07-30 VITALS — BP 126/76 | HR 64 | Temp 97.4°F | Resp 16 | Ht 72.0 in | Wt 156.0 lb

## 2016-07-30 DIAGNOSIS — I2510 Atherosclerotic heart disease of native coronary artery without angina pectoris: Secondary | ICD-10-CM | POA: Diagnosis not present

## 2016-07-30 DIAGNOSIS — I10 Essential (primary) hypertension: Secondary | ICD-10-CM

## 2016-07-30 DIAGNOSIS — E782 Mixed hyperlipidemia: Secondary | ICD-10-CM | POA: Diagnosis not present

## 2016-07-30 DIAGNOSIS — E559 Vitamin D deficiency, unspecified: Secondary | ICD-10-CM | POA: Diagnosis not present

## 2016-07-30 DIAGNOSIS — R7303 Prediabetes: Secondary | ICD-10-CM | POA: Diagnosis not present

## 2016-07-30 DIAGNOSIS — Z79899 Other long term (current) drug therapy: Secondary | ICD-10-CM

## 2016-07-30 LAB — LIPID PANEL
CHOLESTEROL: 223 mg/dL — AB (ref <200)
HDL: 53 mg/dL (ref >40)
LDL Cholesterol: 143 mg/dL — ABNORMAL HIGH (ref <100)
Total CHOL/HDL Ratio: 4.2 Ratio (ref <5.0)
Triglycerides: 137 mg/dL (ref <150)
VLDL: 27 mg/dL (ref <30)

## 2016-07-30 LAB — CBC WITH DIFFERENTIAL/PLATELET
BASOS ABS: 75 {cells}/uL (ref 0–200)
Basophils Relative: 1 %
EOS PCT: 2 %
Eosinophils Absolute: 150 cells/uL (ref 15–500)
HCT: 44 % (ref 38.5–50.0)
Hemoglobin: 14.9 g/dL (ref 13.2–17.1)
LYMPHS PCT: 27 %
Lymphs Abs: 2025 cells/uL (ref 850–3900)
MCH: 32.5 pg (ref 27.0–33.0)
MCHC: 33.9 g/dL (ref 32.0–36.0)
MCV: 96.1 fL (ref 80.0–100.0)
MONOS PCT: 9 %
MPV: 9.7 fL (ref 7.5–12.5)
Monocytes Absolute: 675 cells/uL (ref 200–950)
NEUTROS PCT: 61 %
Neutro Abs: 4575 cells/uL (ref 1500–7800)
PLATELETS: 149 10*3/uL (ref 140–400)
RBC: 4.58 MIL/uL (ref 4.20–5.80)
RDW: 13.7 % (ref 11.0–15.0)
WBC: 7.5 10*3/uL (ref 3.8–10.8)

## 2016-07-30 LAB — HEPATIC FUNCTION PANEL
ALT: 15 U/L (ref 9–46)
AST: 21 U/L (ref 10–35)
Albumin: 4.1 g/dL (ref 3.6–5.1)
Alkaline Phosphatase: 82 U/L (ref 40–115)
BILIRUBIN DIRECT: 0.2 mg/dL (ref <=0.2)
Indirect Bilirubin: 0.9 mg/dL (ref 0.2–1.2)
Total Bilirubin: 1.1 mg/dL (ref 0.2–1.2)
Total Protein: 6.2 g/dL (ref 6.1–8.1)

## 2016-07-30 LAB — BASIC METABOLIC PANEL WITH GFR
BUN: 11 mg/dL (ref 7–25)
CALCIUM: 9.1 mg/dL (ref 8.6–10.3)
CO2: 28 mmol/L (ref 20–31)
CREATININE: 0.94 mg/dL (ref 0.70–1.25)
Chloride: 103 mmol/L (ref 98–110)
GFR, Est African American: >89 mL/min (ref >=60)
GFR, Est Non African American: 84 mL/min (ref >=60)
Glucose, Bld: 97 mg/dL (ref 65–99)
Potassium: 4.4 mmol/L (ref 3.5–5.3)
SODIUM: 140 mmol/L (ref 135–146)

## 2016-07-30 LAB — TSH: TSH: 1.35 mIU/L (ref 0.40–4.50)

## 2016-07-30 LAB — MAGNESIUM: MAGNESIUM: 1.9 mg/dL (ref 1.5–2.5)

## 2016-07-30 NOTE — Progress Notes (Signed)
[facility] Unk Pinto, M.D.        Michael [name]. Silverio Lay, P.A.-C       Starlyn Skeans, P.A.-C  [facility], [place]                [address] [place], [place] [address] Telephone [phone] Telefax [phone] ______________________________________________________________________     This very nice 66 y.o. MWM presents for 6 month follow up with Hypertension, ASCAD Hyperlipidemia, Pre-Diabetes and Vitamin D Deficiency.      Patient is treated for HTN & BP has been controlled at home. 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 Today's BP is at goal - 126/76. Patient has had no complaints of any cardiac type chest pain, palpitations, dyspnea/orthopnea/PND, dizziness, claudication, or dependent edema.     Hyperlipidemia is not controlled  with diet & meds. Patient has hx/o intolerance to statins.  Last Lipids were  Lab Results  Component Value Date   CHOL 226 (H) 04/28/2016   HDL 46 04/28/2016   LDLCALC 136 (H) 04/28/2016   TRIG 218 (H) 04/28/2016   CHOLHDL 4.9 04/28/2016      Also, the patient has history of PreDiabetes (A1c 5.7% in 2010) and has had no symptoms of reactive hypoglycemia, diabetic polys, paresthesias or visual blurring.  Last A1c was at goal: Lab Results  Component Value Date   HGBA1C 5.3 04/28/2016      Further, the patient also has history of Vitamin D Deficiency ("25" in 2009)  and supplements vitamin D without any suspected side-effects. Last vitamin D was  at goal: Lab Results  Component Value Date   VD25OH 57 01/15/2016   Current Outpatient Prescriptions on File Prior to Visit  Medication Sig  . albuterol (PROVENTIL HFA;VENTOLIN HFA) 108 (90 BASE) MCG/ACT inhaler Inhale 1-2 puffs into the lungs every 6 (six) hours as needed for wheezing or shortness of breath.  Marland Kitchen aspirin EC 81 MG tablet Take 81 mg by mouth daily.  Marland Kitchen b  complex vitamins tablet Take 1 tablet by mouth daily.  . bisoprolol-hydrochlorothiazide (ZIAC) 10-6.25 MG tablet TAKE ONE TABLET BY MOUTH ONCE DAILY FOR BLOOD PRESSURE  . cholecalciferol (VITAMIN D) 1000 UNITS tablet 1,000 Units. 4000 total a day  . fluticasone furoate-vilanterol (BREO ELLIPTA) 100-25 MCG/INH AEPB Inhale 1 puff into the lungs daily.  . Ginger, Zingiber officinalis, (GINGER PO) Take 1 capsule by mouth daily.  . Multiple Vitamin (MULTIVITAMIN) tablet Take 1 tablet by mouth daily. Takes a vitamin pack daily  . OVER THE COUNTER MEDICATION Fish oil 1000 mg 1 capsule daily.  . saw palmetto 500 MG capsule Take 500 mg by mouth daily.  Marland Kitchen ezetimibe (ZETIA) 10 MG tablet Take 1 tablet (10 mg total) by mouth daily. (Patient not taking: Reported on 07/30/2016)   No current facility-administered medications on file prior to visit.    Allergies  Allergen Reactions  . Ace Inhibitors Cough  . Ciprofloxacin   . Fenofibrate     dizzy   PMHx:   Past Medical History:  Diagnosis Date  . BPH (benign prostatic hyperplasia)   . COPD (chronic obstructive pulmonary disease) ([place])    via CT lung  . GERD (gastroesophageal reflux disease)   . Hyperlipidemia   . Hypertension   . Hypogonadism male   . Myocardial infarction 2005  . Prediabetes   .  Vitamin D deficiency    Immunization History  Administered Date(s) Administered  . DTaP 12/08/2010  . PPD Test 12/25/2013  . Pneumococcal-Unspecified 12/08/2010   Procedure Laterality  . AMPUTATION FINGER / THUMB Left  . PROSTATE SURGERY   . VASECTOMY    FHx:    Reviewed / unchanged  SHx:    Reviewed / unchanged  Systems Review:  Constitutional: Denies fever, chills, wt changes, headaches, insomnia, fatigue, night sweats, change in appetite. Eyes: Denies redness, blurred vision, diplopia, discharge, itchy, watery eyes.  ENT: Denies discharge, congestion, post nasal drip, epistaxis, sore throat, earache, hearing loss, dental pain, tinnitus,  vertigo, sinus pain, snoring.  CV: Denies chest pain, palpitations, irregular heartbeat, syncope, dyspnea, diaphoresis, orthopnea, PND, claudication or edema. Respiratory: denies cough, dyspnea, DOE, pleurisy, hoarseness, laryngitis, wheezing.  Gastrointestinal: Denies dysphagia, odynophagia, heartburn, reflux, water brash, abdominal pain or cramps, nausea, vomiting, bloating, diarrhea, constipation, hematemesis, melena, hematochezia  or hemorrhoids. Genitourinary: Denies dysuria, frequency, urgency, nocturia, hesitancy, discharge, hematuria or flank pain. Musculoskeletal: Denies arthralgias, myalgias, stiffness, jt. swelling, pain, limping or strain/sprain.  Skin: Denies pruritus, rash, hives, warts, acne, eczema or change in skin lesion(s). Neuro: No weakness, tremor, incoordination, spasms, paresthesia or pain. Psychiatric: Denies confusion, memory loss or sensory loss. Endo: Denies change in weight, skin or hair change.  Heme/Lymph: No excessive bleeding, bruising or enlarged lymph nodes.  Physical Exam  BP 126/76   Pulse 64   Temp 97.4 F (36.3 C)   Resp 16   Ht 6' (1.829 m)   Wt 156 lb (70.8 kg)   BMI 21.16 kg/m   Appears well nourished and in no distress.  Eyes: PERRLA, EOMs, conjunctiva no swelling or erythema. Sinuses: No frontal/maxillary tenderness ENT/Mouth: EAC's clear, TM's nl w/o erythema, bulging. Nares clear w/o erythema, swelling, exudates. Oropharynx clear without erythema or exudates. Oral hygiene is good. Tongue normal, non obstructing. Hearing intact.  Neck: Supple. Thyroid nl. Car 2+/2+ without bruits, nodes or JVD. Chest: Respirations nl with BS clear & equal w/o rales, rhonchi, wheezing or stridor.  Cor: Heart sounds normal w/ regular rate and rhythm without sig. murmurs, gallops, clicks, or rubs. Peripheral pulses normal and equal  without edema.  Abdomen: Soft & bowel sounds normal. Non-tender w/o guarding, rebound, hernias, masses, or organomegaly.   Lymphatics: Unremarkable.  Musculoskeletal: Full ROM all peripheral extremities, joint stability, 5/5 strength, and normal gait.  Skin: Warm, dry without exposed rashes, lesions or ecchymosis apparent.  Neuro: Cranial nerves intact, reflexes equal bilaterally. Sensory-motor testing grossly intact. Tendon reflexes grossly intact.  Pysch: Alert & oriented x 3.  Insight and judgement nl & appropriate. No ideations.  Assessment and Plan:  1. Essential hypertension  - Continue medication, monitor blood pressure at home.  - Continue DASH diet. Reminder to go to the ER if any CP,    SOB, nausea, dizziness, severe HA, changes vision/speech,   left arm numbness and tingling and jaw pain. - CBC with Differential/Platelet - BASIC METABOLIC PANEL WITH GFR - Magnesium - TSH  2. Mixed hyperlipidemia  - Continue diet/meds, exercise,& lifestyle modifications.  - Continue monitor periodic cholesterol/liver & renal functions  - Hepatic function panel - Lipid panel - TSH  3. Prediabetes  - Continue diet, exercise, lifestyle modifications.  - Monitor appropriate labs. - Hemoglobin A1c - Insulin, random  4. Vitamin D deficiency  - Continue supplementation. - VITAMIN D 25 Hydroxy   5. ASHD  - Ambulatory referral to Cardiology to elective stress testing for CLL.  6. Medication management   - CBC with Differential/Platelet - BASIC METABOLIC PANEL WITH GFR - Hepatic function panel - Magnesium - Lipid panel - TSH - Hemoglobin A1c - Insulin, random - VITAMIN D 25 Hydroxy       Recommended regular exercise, BP monitoring, weight control, and discussed med and SE's. Recommended labs to assess and monitor clinical status. Further disposition pending results of labs. Over 30 minutes of exam, counseling, chart review was performed

## 2016-07-30 NOTE — Patient Instructions (Signed)

## 2016-07-31 LAB — HEMOGLOBIN A1C
HEMOGLOBIN A1C: 5.3 % (ref <5.7)
MEAN PLASMA GLUCOSE: 105 mg/dL

## 2016-07-31 LAB — VITAMIN D 25 HYDROXY (VIT D DEFICIENCY, FRACTURES): Vit D, 25-Hydroxy: 69 ng/mL (ref 30–100)

## 2016-08-02 LAB — INSULIN, RANDOM: INSULIN: 4.8 u[IU]/mL (ref 2.0–19.6)

## 2016-08-04 ENCOUNTER — Other Ambulatory Visit: Payer: Self-pay | Admitting: *Deleted

## 2016-08-04 MED ORDER — ROSUVASTATIN CALCIUM 40 MG PO TABS: ORAL_TABLET | ORAL | 6 refills | Status: DC

## 2016-09-03 ENCOUNTER — Encounter: Payer: Self-pay | Admitting: Cardiovascular Disease

## 2016-09-03 ENCOUNTER — Ambulatory Visit (INDEPENDENT_AMBULATORY_CARE_PROVIDER_SITE_OTHER): Payer: 59 | Admitting: Cardiovascular Disease

## 2016-09-03 ENCOUNTER — Telehealth (HOSPITAL_COMMUNITY): Payer: Self-pay

## 2016-09-03 VITALS — BP 122/76 | HR 60 | Ht 71.0 in | Wt 158.0 lb

## 2016-09-03 DIAGNOSIS — I10 Essential (primary) hypertension: Secondary | ICD-10-CM | POA: Diagnosis not present

## 2016-09-03 DIAGNOSIS — Z79899 Other long term (current) drug therapy: Secondary | ICD-10-CM

## 2016-09-03 DIAGNOSIS — I2510 Atherosclerotic heart disease of native coronary artery without angina pectoris: Secondary | ICD-10-CM

## 2016-09-03 DIAGNOSIS — E782 Mixed hyperlipidemia: Secondary | ICD-10-CM

## 2016-09-03 DIAGNOSIS — Z716 Tobacco abuse counseling: Secondary | ICD-10-CM | POA: Diagnosis not present

## 2016-09-03 DIAGNOSIS — J4520 Mild intermittent asthma, uncomplicated: Secondary | ICD-10-CM

## 2016-09-03 MED ORDER — ROSUVASTATIN CALCIUM 40 MG PO TABS: ORAL_TABLET | ORAL | 6 refills | Status: DC

## 2016-09-03 MED ORDER — EZETIMIBE 10 MG PO TABS
10.0000 mg | ORAL_TABLET | Freq: Every day | ORAL | 11 refills | Status: DC
Start: 2016-09-03 — End: 2017-02-14

## 2016-09-03 NOTE — Telephone Encounter (Signed)
Encounter complete. 

## 2016-09-03 NOTE — Progress Notes (Signed)
Cardiology Office Note    Date:  09/05/2016   ID:  Michael [name], DOB 06-Jun-1950, MRN [id number]  PCP:  Alesia Richards, MD  Cardiologist:  Shelva Majestic, MD   Chief Complaint  Patient presents with  . 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 male who is referred through the courtesy of Dr. Unk Pinto to reestablish cardiology care and for a DOT physical required nuclear stress test.  Michael [name] has a history of CAD and hypertension.  In 2005 he presented with a myocardial infarction and catheterization reportedly demonstrated a small branch occlusion of the circumflex vessel.  Due to the small caliber, medical therapy was recommended.  He apparently has not had follow-up cardiology evaluation in over 12  years.  He has a history of hyperlipidemia and ongoing tobacco use.  States that he had quit smoking approximately for 6 months after his MI but ultimately resumed.  He started smoking at age 25 and is still smoking a pack per day.  He has been on a medical regimen consisting of bisoprolol HCT Z 10/6.25 mg daily and has been taking rosuvastatin 20 mg twice a week.  He had recently seen his primary physician and also a physician for his DOT physical.  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  With his history of prior myocardial infarction.  It was recommended that he needed a nuclear stress test prior to completing his license.  He normal.  He denies any history of chest pain.  Times he notes a rare twinge under his left arm.  He denies significant change in walking ability.  Of note, when he was seen by Dr. Melford Aase.  His cholesterol was 226, HDL 46, LDL 136, triglycerides 218, and it was recommended that Zetia be added to his regimen.  The patient  never started this.  He presents now for cardiology evaluation.    Past Medical History:  Diagnosis Date  . BPH (benign prostatic hyperplasia)   . COPD (chronic obstructive pulmonary disease) ([place])    via CT lung  . GERD (gastroesophageal reflux disease)   . Hyperlipidemia   . Hypertension   . Hypogonadism male   . Myocardial infarction [facility]) 2005   Per patient no stent or PTCA, just medical treatment  . Prediabetes   . Vitamin D deficiency     Past Surgical History:  Procedure Laterality Date  . AMPUTATION FINGER / THUMB Left 1991   distal index  . CARDIAC CATHETERIZATION N/A 2005   Per patient no stent or PTCA, just medical treatment  . colonoscopy N/A 10/2012   Neg and recc f/u Cololnoscopy in 5 yeary - Dr Nicki Reaper [place] - Gastroenterologist  . PROSTATE SURGERY     Biopsy X 3  . VASECTOMY      Current Medications: Outpatient Medications Prior to Visit  Medication Sig Dispense Refill  . albuterol (PROVENTIL HFA;VENTOLIN HFA) 108 (90 BASE) MCG/ACT inhaler Inhale 1-2 puffs into the lungs every 6 (six) hours as needed for wheezing or shortness of breath. 18 g 2  . aspirin EC 81 MG tablet Take 81 mg by mouth daily.    . bisoprolol-hydrochlorothiazide (ZIAC) 10-6.25 MG tablet TAKE ONE TABLET BY MOUTH ONCE DAILY  FOR BLOOD PRESSURE 90 tablet 1  . cholecalciferol (VITAMIN D) 1000 UNITS tablet 1,000 Units. 4000 total a day    . fluticasone furoate-vilanterol (BREO ELLIPTA) 100-25 MCG/INH AEPB Inhale 1 puff into the lungs daily. 60 each 6  . Ginger, Zingiber officinalis, (GINGER PO) Take 1 capsule by mouth daily.    . Multiple Vitamin (MULTIVITAMIN) tablet Take 1 tablet by mouth daily. Takes a vitamin pack daily    . saw palmetto 500 MG capsule Take 500 mg by mouth daily.    Marland Kitchen ezetimibe (ZETIA) 10 MG tablet Take 1 tablet (10 mg total) by mouth daily. 30 tablet 11  . rosuvastatin (CRESTOR) 40 MG tablet Take 1/2 to 1 tablet daily or as directed for cholesterol. 30 tablet 6  . b  complex vitamins tablet Take 1 tablet by mouth daily.    Marland Kitchen OVER THE COUNTER MEDICATION Fish oil 1000 mg 1 capsule daily.     No facility-administered medications prior to visit.      Allergies:   Ace inhibitors; Ciprofloxacin; and Fenofibrate   Social History   Social History  . Marital status: Married    Spouse name: N/A  . Number of children: N/A  . Years of education: N/A   Social History Main Topics  . Smoking status: Current Every Day Smoker    Packs/day: 1.00    Years: 36.00    Types: Cigarettes  . Smokeless tobacco: Never Used  . Alcohol use 3.0 oz/week    5 Cans of beer per week  . Drug use: No  . Sexual activity: Not Asked   Other Topics Concern  . None   Social History Narrative   Epworth Sleepiness score 5      Additional social history is notable that he has been separated for 3 years.  He has 4 children, 2 grandchildren, and one great-grandchild.  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  He has been smoking cigarettes for 52 years.  He drinks an occasional beer  Family History:  The patient's family history includes Cancer in his maternal aunt, maternal uncle, and maternal uncle; Cancer (age of onset: 79) in his father; Heart failure in his maternal grandfather; Liver disease in his paternal grandfather; Other in his son; Pneumonia in his paternal grandmother; Stroke in his maternal grandmother and son.   His mother died at age 36 with natural cause.  His father died at age 42 and had cancer/lymphoma.  He has 4 brothers, one who has hypertension.  He has 3 living children.  One child died at age 93.  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   PHYSICAL EXAM:   VS:  BP 122/76 (BP Location: Right Arm, Patient Position: Sitting, Cuff Size: Normal)   Pulse 60   Ht  '5\' 11"'  (1.803 m)   Wt 158 lb (71.7 kg)   BMI 22.04 kg/m     Repeat blood pressure 128/70  Wt Readings from Last 3 Encounters:  09/03/16 158 lb (71.7 kg)  07/30/16 156 lb (70.8 kg)  04/28/16 155 lb (70.3 kg)    General: Alert, oriented, no distress.  Skin: normal turgor, no rashes, warm and dry HEENT: Normocephalic, atraumatic. Pupils equal round and reactive to light; sclera anicteric; extraocular muscles intact; Fundi Intraocular lens implantation secondary to cataract surgery Nose without nasal septal hypertrophy Mouth/Parynx benign; Mallinpatti scale 3 Neck: No JVD, no carotid bruits; normal carotid upstroke Lungs: clear to ausculatation and percussion; no wheezing or rales Chest wall: without tenderness to palpitation Heart: PMI not displaced, RRR, s1 s2 normal, 1/6 systolic murmur, no diastolic murmur, no rubs, gallops, thrills, or heaves Abdomen: soft, nontender; no hepatosplenomehaly, BS+; abdominal aorta nontender and not dilated by palpation. Back: no CVA tenderness Pulses 2+ Musculoskeletal: full range of motion, normal strength,  Extremities: He is missing the distal branches of his left second, third and fourth fingers  due to prior amputation secondary to mechanical trauma; no clubbing cyanosis or edema, Homan's sign negative  Neurologic: grossly nonfocal; Cranial nerves grossly wnl Psychologic: Normal mood and affect   Studies/Labs Reviewed:   EKG:  EKG is ordered today.  ECG (independently read by me): Normal sinus rhythm at 60 bpm.  Normal intervals.  Normal ECG.  Recent Labs: BMP Latest Ref Rng & Units 07/30/2016 04/28/2016 01/15/2016  Glucose 65 - 99 mg/dL 97 108(H) 89  BUN 7 - 25 mg/dL '11 17 16  ' Creatinine 0.70 - 1.25 mg/dL 0.94 0.93 0.91  Sodium 135 - 146 mmol/L 140 140 140  Potassium 3.5 - 5.3 mmol/L 4.4 4.1 4.2  Chloride 98 - 110 mmol/L 103 104 104  CO2 20 - 31 mmol/L '28 26 25  ' Calcium 8.6 - 10.3 mg/dL 9.1 8.8 9.1     Hepatic Function Latest Ref Rng & Units 07/30/2016 04/28/2016 01/15/2016  Total Protein 6.1 - 8.1 g/dL 6.2 6.0(L) 6.1  Albumin 3.6 - 5.1 g/dL 4.1 3.9 3.8  AST 10 - 35 U/L '21 20 23  ' ALT 9 - 46 U/L '15 14 18  ' Alk Phosphatase 40 - 115 U/L 82 72 77  Total Bilirubin 0.2 - 1.2 mg/dL 1.1 1.1 1.0  Bilirubin, Direct <=0.2 mg/dL 0.2 0.2 0.1    CBC Latest Ref Rng & Units 07/30/2016 04/28/2016 01/15/2016  WBC 3.8 - 10.8 K/uL 7.5 8.6 9.2  Hemoglobin 13.2 - 17.1 g/dL 14.9 14.4 13.8  Hematocrit 38.5 - 50.0 % 44.0 43.2 40.9  Platelets 140 - 400 K/uL 149 102(L) 125(L)   Lab Results  Component Value Date   MCV 96.1 07/30/2016   MCV 97.7 04/28/2016   MCV 95.8 01/15/2016   Lab Results  Component Value Date   TSH 1.35 07/30/2016   Lab Results  Component Value Date   HGBA1C 5.3 07/30/2016     BNP No results found for: BNP  ProBNP No results found for: PROBNP   Lipid Panel     Component Value Date/Time   CHOL 223 (H) 07/30/2016 0956   TRIG 137 07/30/2016 0956   HDL 53 07/30/2016 0956   CHOLHDL 4.2 07/30/2016 0956   VLDL 27 07/30/2016 0956   LDLCALC 143 (H) 07/30/2016 0956     RADIOLOGY: No results found.   Additional studies/ records that were reviewed today include:    I reviewed office records from Dr.  Melford Aase, recent labs.     ASSESSMENT:    1. Coronary artery disease involving native coronary artery of native heart without angina pectoris   2. Essential hypertension   3. Mixed hyperlipidemia   4. Medication management   5. Tobacco abuse counseling   6. Intermittent asthma without complication, unspecified asthma severity      PLAN:  Michael [name] is a 66 year old gentleman who has a history of myocardial infarction and underwent catheterization in 2005 which showed occlusion of a small branch of the circumflex vessel.  He has been on medical therapy since that time and he did not undergo intervention.  He has a tobacco history of greater than 50 years and unfortunately continues to smoke cigarettes 1 pack per day.  So has a history of hypertension as well as hyperlipidemia.  His blood pressure today is controlled on his medical regimen consisting of bisoprolol HCT.  He is not having any anginal type symptomatology or any symptoms worrisome for CHF.  Recent laboratory from his primary physician.  He continues to have significant hyperlipidemia with an LDL cholesterol of 136, and triglycerides of 218 with total cholesterol 226 and HDL 46.  He has only been taking Crestor 2 times per week and I recommended he increase this to every other day.  I also have adding Zetia 10 mg.  I discussed with him the high likelihood of CAD progression.  With his continued LDL level being significantly elevated and optimal therapy should be to try to reach an LDL cholesterol in the 60s or below to  hopefully induce regression of plaque.  In the past.  He had myalgias with more frequent dosing of statins and I've suggested coenzyme Q 10 3 mg daily.  I discussed the importance of smoking cessation and counsel.  He has a history of asthma and is on debris oh, Al[place]ta in addition to when necessary albuterol.  He will undergo repeat blood work with Dr. Melford Aase in June.  With his DOT  requirement to [facility] a nuclear stress test  I am scheduling him for an nuclear perfusion study as soon aspossible since his license needs to be renewed prior to the end of this month.  He did not feel that he could walk well on the treadmill and this will be scheduled as a Lexiscan study.    Medication Adjustments/Labs and Tests Ordered: Current medicines are reviewed at length with the patient today.  Concerns regarding medicines are outlined above.  Medication changes, Labs and Tests ordered today are listed in the Patient Instructions below.  Patient Instructions  Medication Instructions:   The rosuvastatin has been changed to every other day. Start ezetimibe (zetia). A prescription has been sent to your pharmacy.  Labwork:  To be done by Dr Wayland Denis  Testing/Procedures:  Your physician has requested that you have a lexiscan myoview.[email or web address] For further information please visit [email or web address]. Please follow instruction sheet, as given.    Follow-Up:  6 months or sooner if needed with Dr Claiborne Billings.  Any Other Special Instructions Will Be Listed Below (If Applicable).      Signed, Shelva Majestic, MD , [facility] 09/05/2016 10:57 AM    [place] [address] [place], [place],   [address] Phone: [phone]

## 2016-09-03 NOTE — Patient Instructions (Addendum)
Medication Instructions:   The rosuvastatin has been changed to every other day. Start ezetimibe (zetia). A prescription has been sent to your pharmacy.  Labwork:  To be done by Dr Wayland Denis  Testing/Procedures:  Your physician has requested that you have a lexiscan myoview.ASAP For further information please visit [email or web address]. Please follow instruction sheet, as given.    Follow-Up:  6 months or sooner if needed with Dr Claiborne Billings.  Any Other Special Instructions Will Be Listed Below (If Applicable).

## 2016-09-05 ENCOUNTER — Encounter: Payer: Self-pay | Admitting: Cardiovascular Disease

## 2016-09-07 ENCOUNTER — Ambulatory Visit (HOSPITAL_COMMUNITY)
Admission: RE | Admit: 2016-09-07 | Discharge: 2016-09-07 | Disposition: A | Discharge status: Home / Self Care | Payer: 59 | Source: Ambulatory Visit | Attending: Cardiology | Admitting: Cardiology

## 2016-09-07 DIAGNOSIS — I10 Essential (primary) hypertension: Secondary | ICD-10-CM | POA: Diagnosis not present

## 2016-09-07 DIAGNOSIS — I2510 Atherosclerotic heart disease of native coronary artery without angina pectoris: Secondary | ICD-10-CM | POA: Diagnosis present

## 2016-09-07 LAB — MYOCARDIAL PERFUSION IMAGING
CHL CUP NUCLEAR SDS: 0
CHL CUP NUCLEAR SRS: 0
CHL CUP NUCLEAR SSS: 0
LV dias vol: 108 mL (ref 62–150)
LV sys vol: 44 mL
NUC STRESS TID: 1.16
Peak HR: 87 {beats}/min
Rest HR: 61 {beats}/min

## 2016-09-07 MED ORDER — REGADENOSON 0.4 MG/5ML IV SOLN
0.4000 mg | Freq: Once | INTRAVENOUS | Status: AC
  Administered 2016-09-07: 0.4 mg via INTRAVENOUS

## 2016-09-07 MED ORDER — TECHNETIUM TC 99M TETROFOSMIN IV KIT
30.5000 | PACK | Freq: Once | INTRAVENOUS | Status: AC | PRN
  Administered 2016-09-07: 30.5 via INTRAVENOUS
  Filled 2016-09-07: qty 31

## 2016-09-07 MED ORDER — TECHNETIUM TC 99M TETROFOSMIN IV KIT
10.8000 | PACK | Freq: Once | INTRAVENOUS | Status: AC | PRN
  Administered 2016-09-07: 10.8 via INTRAVENOUS
  Filled 2016-09-07: qty 11

## 2016-10-05 ENCOUNTER — Encounter: Payer: Self-pay | Admitting: *Deleted

## 2016-10-28 ENCOUNTER — Other Ambulatory Visit: Payer: Self-pay | Admitting: Internal Medicine

## 2016-11-07 NOTE — Progress Notes (Signed)
Assessment and Plan:   Essential hypertension - continue medications, DASH diet, exercise and monitor at home. Call if greater than 130/80.  -     CBC with Differential/Platelet -     BASIC METABOLIC PANEL WITH GFR -     Hepatic function panel -     TSH  Chronic obstructive pulmonary disease, unspecified COPD type ([place]) Advised to stop smoking,  continue meds from [place]  Mixed hyperlipidemia -continue medications, check lipids, decrease fatty foods, increase activity. ' -     Lipid panel  Thrombocytopenia ([place]) monitor -     CBC with Differential/Platelet  Medication management -     Magnesium  Tobacco use disorder Smoking cessation-  instruction/counseling given, counseled patient on the dangers of tobacco use, advised patient to stop smoking, and reviewed strategies to maximize success, patient not ready to quit at this time.   Future Appointments Date Time Provider [place]  02/10/2017 9:00 AM Unk Pinto, MD [facility] None   Continue diet and meds as discussed. Further disposition pending results of labs.  HPI 66 y.o. male  presents for 3 month follow up with hypertension, hyperlipidemia, prediabetes and vitamin D. Had tick bite over a week ago, on him less than 24 hours, no fever, chills, joint pain.   His blood pressure has been controlled at home, today their BP is BP: 122/68.  He does not workout. He denies chest pain, shortness of breath, dizziness. He has COPD, on breo, follows the [place], continues to smoke at this time.    He is on cholesterol medication and denies myalgias. His cholesterol is not at goal. The cholesterol last visit was:   Lab Results  Component Value Date   CHOL 223 (H) 07/30/2016   HDL 53 07/30/2016   LDLCALC 143 (H) 07/30/2016   TRIG 137 07/30/2016   CHOLHDL 4.2 07/30/2016    He has been working on diet and exercise for prediabetes, and denies foot ulcerations, hyperglycemia, hypoglycemia , increased appetite, nausea, paresthesia  of the feet, polydipsia, polyuria, visual disturbances, vomiting and weight loss. Last A1C in the office was:  Lab Results  Component Value Date   HGBA1C 5.3 07/30/2016   Patient is on Vitamin D supplement.  Lab Results  Component Value Date   VD25OH 69 07/30/2016     BMI is Body mass index is 21.26 kg/m., he is working on diet and exercise. Wt Readings from Last 3 Encounters:  11/08/16 152 lb 6.4 oz (69.1 kg)  09/07/16 158 lb (71.7 kg)  09/03/16 158 lb (71.7 kg)     Current Medications:  Current Outpatient Prescriptions on File Prior to Visit  Medication Sig Dispense Refill  . albuterol (PROVENTIL HFA;VENTOLIN HFA) 108 (90 BASE) MCG/ACT inhaler Inhale 1-2 puffs into the lungs every 6 (six) hours as needed for wheezing or shortness of breath. 18 g 2  . aspirin EC 81 MG tablet Take 81 mg by mouth daily.    . bisoprolol-hydrochlorothiazide (ZIAC) 10-6.25 MG tablet TAKE ONE TABLET BY MOUTH ONCE DAILY FOR BLOOD PRESSURE 90 tablet 1  . cholecalciferol (VITAMIN D) 1000 UNITS tablet 1,000 Units. 4000 total a day    . ezetimibe (ZETIA) 10 MG tablet Take 1 tablet (10 mg total) by mouth daily. 30 tablet 11  . fluticasone furoate-vilanterol (BREO ELLIPTA) 100-25 MCG/INH AEPB Inhale 1 puff into the lungs daily. 60 each 6  . Ginger, Zingiber officinalis, (GINGER PO) Take 1 capsule by mouth daily.    . Multiple Vitamin (MULTIVITAMIN) tablet  Take 1 tablet by mouth daily. Takes a vitamin pack daily    . rosuvastatin (CRESTOR) 40 MG tablet Take 1 tablet every other day as directed for cholesterol. 30 tablet 6  . saw palmetto 500 MG capsule Take 500 mg by mouth daily.     No current facility-administered medications on file prior to visit.     Medical History:  Past Medical History:  Diagnosis Date  . BPH (benign prostatic hyperplasia)   . COPD (chronic obstructive pulmonary disease) ([place])    via CT lung  . GERD (gastroesophageal reflux disease)   . Hyperlipidemia   . Hypertension   .  Hypogonadism male   . Myocardial infarction [facility]) 2005   Per patient no stent or PTCA, just medical treatment  . Prediabetes   . Vitamin D deficiency     Allergies:  Allergies  Allergen Reactions  . Ace Inhibitors Cough  . Ciprofloxacin   . Fenofibrate     dizzy     Review of Systems:  Review of Systems  Constitutional: Negative for chills, fever and malaise/fatigue.  HENT: Negative for congestion, ear pain and sore throat.   Eyes: Negative.   Respiratory: Negative for cough, shortness of breath and wheezing.   Cardiovascular: Negative for chest pain, palpitations and leg swelling.  Gastrointestinal: Negative for abdominal pain, blood in stool, constipation, diarrhea, heartburn and melena.  Genitourinary: Negative.   Skin: Negative.   Neurological: Negative for dizziness, sensory change, loss of consciousness and headaches.  Psychiatric/Behavioral: Negative for depression. The patient is not nervous/anxious and does not have insomnia.     Family history- Review and unchanged  Social history- Review and unchanged  Physical Exam: BP 122/68   Pulse 61   Temp 97.3 F (36.3 C)   Resp 16   Ht 5\' 11"  (1.803 m)   Wt 152 lb 6.4 oz (69.1 kg)   SpO2 97%   BMI 21.26 kg/m  Wt Readings from Last 3 Encounters:  11/08/16 152 lb 6.4 oz (69.1 kg)  09/07/16 158 lb (71.7 kg)  09/03/16 158 lb (71.7 kg)    General Appearance: Well nourished well developed, in no apparent distress. Eyes: PERRLA, EOMs, conjunctiva no swelling or erythema ENT/Mouth: Ear canals normal without obstruction, swelling, erythma, discharge.  TMs normal bilaterally.  Oropharynx moist, clear, without exudate, or postoropharyngeal swelling. Neck: Supple, thyroid normal,no cervical adenopathy  Respiratory: Respiratory effort normal, Breath sounds clear A&P without rhonchi, wheeze, or rale.  No retractions, no accessory usage. Cardio: RRR with no MRGs. Brisk peripheral pulses without edema.  Abdomen: Soft, + BS,   Non tender, no guarding, rebound, hernias, masses. Musculoskeletal: Full ROM, 5/5 strength, Normal gait, left hand with amputation  Skin: Warm, dry without rashes, lesions, ecchymosis.  Neuro: Awake and oriented X 3, Cranial nerves intact. Normal muscle tone, no cerebellar symptoms. Psych: Normal affect, Insight and Judgment appropriate.    Vicie Mutters, PA-C 8:38 AM [facility]

## 2016-11-08 ENCOUNTER — Ambulatory Visit (INDEPENDENT_AMBULATORY_CARE_PROVIDER_SITE_OTHER): Payer: 59 | Admitting: Physician Assistant

## 2016-11-08 ENCOUNTER — Encounter: Payer: Self-pay | Admitting: Physician Assistant

## 2016-11-08 VITALS — BP 122/68 | HR 61 | Temp 97.3°F | Resp 16 | Ht 71.0 in | Wt 152.4 lb

## 2016-11-08 DIAGNOSIS — F17200 Nicotine dependence, unspecified, uncomplicated: Secondary | ICD-10-CM | POA: Diagnosis not present

## 2016-11-08 DIAGNOSIS — D696 Thrombocytopenia, unspecified: Secondary | ICD-10-CM | POA: Diagnosis not present

## 2016-11-08 DIAGNOSIS — I10 Essential (primary) hypertension: Secondary | ICD-10-CM | POA: Diagnosis not present

## 2016-11-08 DIAGNOSIS — J449 Chronic obstructive pulmonary disease, unspecified: Secondary | ICD-10-CM | POA: Diagnosis not present

## 2016-11-08 DIAGNOSIS — E782 Mixed hyperlipidemia: Secondary | ICD-10-CM

## 2016-11-08 DIAGNOSIS — Z79899 Other long term (current) drug therapy: Secondary | ICD-10-CM | POA: Diagnosis not present

## 2016-11-08 LAB — BASIC METABOLIC PANEL WITH GFR
BUN: 12 mg/dL (ref 7–25)
CO2: 29 mmol/L (ref 20–31)
Calcium: 9.1 mg/dL (ref 8.6–10.3)
Chloride: 102 mmol/L (ref 98–110)
Creat: 1.04 mg/dL (ref 0.70–1.25)
GFR, EST NON AFRICAN AMERICAN: 74 mL/min (ref >=60)
GFR, Est African American: 85 mL/min (ref >=60)
Glucose, Bld: 92 mg/dL (ref 65–99)
Potassium: 4.3 mmol/L (ref 3.5–5.3)
SODIUM: 138 mmol/L (ref 135–146)

## 2016-11-08 LAB — LIPID PANEL
CHOL/HDL RATIO: 5.8 ratio — AB (ref <5.0)
CHOLESTEROL: 245 mg/dL — AB (ref <200)
HDL: 42 mg/dL (ref >40)
TRIGLYCERIDES: 425 mg/dL — AB (ref <150)

## 2016-11-08 LAB — HEPATIC FUNCTION PANEL
ALT: 14 U/L (ref 9–46)
AST: 19 U/L (ref 10–35)
Albumin: 4 g/dL (ref 3.6–5.1)
Alkaline Phosphatase: 80 U/L (ref 40–115)
BILIRUBIN DIRECT: 0.1 mg/dL (ref <=0.2)
BILIRUBIN INDIRECT: 0.8 mg/dL (ref 0.2–1.2)
BILIRUBIN TOTAL: 0.9 mg/dL (ref 0.2–1.2)
Total Protein: 6.5 g/dL (ref 6.1–8.1)

## 2016-11-08 LAB — TSH: TSH: 2.59 mIU/L (ref 0.40–4.50)

## 2016-11-08 LAB — CBC WITH DIFFERENTIAL/PLATELET
Basophils Absolute: 96 cells/uL (ref 0–200)
Basophils Relative: 1 %
EOS PCT: 1 %
Eosinophils Absolute: 96 cells/uL (ref 15–500)
HCT: 43.6 % (ref 38.5–50.0)
HEMOGLOBIN: 15 g/dL (ref 13.2–17.1)
Lymphocytes Relative: 26 %
Lymphs Abs: 2496 cells/uL (ref 850–3900)
MCH: 32.6 pg (ref 27.0–33.0)
MCHC: 34.4 g/dL (ref 32.0–36.0)
MCV: 94.8 fL (ref 80.0–100.0)
MPV: 10.3 fL (ref 7.5–12.5)
Monocytes Absolute: 768 cells/uL (ref 200–950)
Monocytes Relative: 8 %
NEUTROS ABS: 6144 {cells}/uL (ref 1500–7800)
Neutrophils Relative %: 64 %
PLATELETS: 102 10*3/uL — AB (ref 140–400)
RBC: 4.6 MIL/uL (ref 4.20–5.80)
RDW: 13.2 % (ref 11.0–15.0)
WBC: 9.6 10*3/uL (ref 3.8–10.8)

## 2016-11-08 LAB — MAGNESIUM: Magnesium: 2 mg/dL (ref 1.5–2.5)

## 2016-11-08 NOTE — Patient Instructions (Signed)
Potassium Content of Foods  The body needs potassium to control blood pressure and to keep the muscles and nervous system healthy. Here are some healthy foods below that are high in potassium. Also you can get the white label salt of "NO SALT" salt substitute, 1/4 teaspoon of this is equivalent to 26meq potassium.   FOODS AND DRINKS HIGH IN POTASSIUM FOODS MODERATE IN POTASSIUM   Fruits  Avocado (cubed),  c / 50 g.  Banana (sliced), 75 g.  Cantaloupe (cubed), 80 g.  Honeydew, 1 wedge / 85 g.  Kiwi (sliced), 90 g.  Nectarine, 1 small / 129 g.  Orange, 1 medium / 131 g. Vegetables  Artichoke,  of a medium / 64 g.  Asparagus (boiled), 90 g..  Broccoli (boiled), 78 g.  Brussels sprout (boiled), 78 g.  Butternut squash (baked), 103 g.  Chickpea (cooked), 82 g.  Green peas (cooked), 80 g.  Kidney beans (cooked), 5 tbsp / 55 g.  Lima beans (cooked),  c / 43 g.  Navy beans (cooked),  c / 61 g.  Spinach (cooked),  c / 45 g.  Sweet potato (baked),  c / 50 g.  Tomato (chopped or sliced), 90 g.  Vegetable juice.  White mushrooms (cooked), 78 g.  Yam (cooked or baked),  c / 34 g.  Zucchini squash (boiled), 90 g. Other Foods and Drinks  Almonds (whole),  c / 36 g.  Fish, 3 oz / 85 g.  Nonfat fruit variety yogurt, 123 g.  Pistachio nuts, 1 oz / 28 g.  Pumpkin seeds, 1 oz / 28 g.  Red meat (broiled, cooked, grilled), 3 oz / 85 g.  Scallops (steamed), 3 oz / 85 g.  Spaghetti sauce,  c / 66 g.  Sunflower seeds (dry roasted), 1 oz / 28 g.  Veggie burger, 1 patty / 70 g. Fruits  Grapefruit,  of the fruit / 902 g  Plums (sliced), 83 g.  Tangerine, 1 large / 120 g. Vegetables  Carrots (boiled), 78 g.  Carrots (sliced), 61 g.  Rhubarb (cooked with sugar), 120 g.  Rutabaga (cooked), 120 g.  Yellow snap beans (cooked), 63 g. Other Foods and Drinks   Chicken breast (roasted and chopped),  c / 70 g.  Pita bread, 1 large / 64 g.  Shrimp  (steamed), 4 oz / 113 g.  Swiss cheese (diced), 70 g.     Seborrheic Keratosis Seborrheic keratosis is a common, noncancerous (benign) skin growth. This condition causes waxy, rough, tan, brown, or black spots to appear on the skin. These skin growths can be flat or raised. What are the causes? The cause of this condition is not known. What increases the risk? This condition is more likely to develop in:  People who have a family history of seborrheic keratosis.  People who are 67 or older.  People who are pregnant.  People who have had estrogen replacement therapy.  What are the signs or symptoms? This condition often occurs on the face, chest, shoulders, back, or other areas. These growths:  Are usually painless, but may become irritated and itchy.  Can be yellow, brown, black, or other colors.  Are slightly raised or have a flat surface.  Are sometimes rough or wart-like in texture.  Are often waxy on the surface.  Are round or oval-shaped.  Sometimes look like they are "stuck on."  Often occur in groups, but may occur as a single growth.  How is this diagnosed? This condition  is diagnosed with a medical history and physical exam. A sample of the growth may be tested (skin biopsy). You may need to see a skin specialist (dermatologist). How is this treated? Treatment is not usually needed for this condition, unless the growths are irritated or are often bleeding. You may also choose to have the growths removed if you do not like their appearance. Most commonly, these growths are treated with a procedure in which liquid nitrogen is applied to "freeze" off the growth (cryosurgery). They may also be burned off with electricity or cut off. Follow these instructions at home:  Watch your growth for any changes.  Keep all follow-up visits as told by your health care provider. This is important.  Do not scratch or pick at the growth or growths. This can cause them to  become irritated or infected. Contact a health care provider if:  You suddenly have many new growths.  Your growth bleeds, itches, or hurts.  Your growth suddenly becomes larger or changes color. This information is not intended to replace advice given to you by your health care provider. Make sure you discuss any questions you have with your health care provider. Document Released: 06/12/2010 Document Revised: 10/16/2015 Document Reviewed: 09/25/2014 Elsevier Interactive Patient Education  2017 [place].   Please monitor your blood pressure. If it is getting below 130/80 AND you are having fatigue with exertion, dizziness we may need to cut your blood pressure medication in half. Please call the office if this is happening. Hypotension As your heart beats, it forces blood through your body. This force is called blood pressure. If you have hypotension, you have low blood pressure. When your blood pressure is too low, you may not get enough blood to your brain. You may feel weak, feel lightheaded, have a fast heartbeat, or even pass out (faint). HOME CARE  Drink enough fluids to keep your pee (urine) clear or pale yellow.  Take all medicines as told by your doctor.  Get up slowly after sitting or lying down.  Wear support stockings as told by your doctor.  Maintain a healthy diet by including foods such as fruits, vegetables, nuts, whole grains, and lean meats. GET HELP IF:  You are throwing up (vomiting) or have watery poop (diarrhea).  You have a fever for more than 2-3 days.  You feel more thirsty than usual.  You feel weak and tired. GET HELP RIGHT AWAY IF:   You pass out (faint).  You have chest pain or a fast or irregular heartbeat.  You lose feeling in part of your body.  You cannot move your arms or legs.  You have trouble speaking.  You get sweaty or feel lightheaded. MAKE SURE YOU:   Understand these instructions.  Will watch your condition.  Will  get help right away if you are not doing well or get worse. Document Released: 08/04/2009 Document Revised: 01/10/2013 Document Reviewed: 11/10/2012 [facility] Patient Information 2015 [place], [place]. This information is not intended to replace advice given to you by your health care provider. Make sure you discuss any questions you have with your health care provider.

## 2016-11-09 NOTE — Progress Notes (Signed)
LVM for pt to return office call for LAB results.

## 2016-11-18 NOTE — Progress Notes (Signed)
Pt aware of lab results & voiced understanding of those results.

## 2016-12-17 ENCOUNTER — Ambulatory Visit (INDEPENDENT_AMBULATORY_CARE_PROVIDER_SITE_OTHER): Payer: 59 | Admitting: Internal Medicine

## 2016-12-17 VITALS — BP 112/50 | HR 64 | Temp 97.2°F | Resp 18 | Ht 71.0 in | Wt 155.4 lb

## 2016-12-17 DIAGNOSIS — J4521 Mild intermittent asthma with (acute) exacerbation: Secondary | ICD-10-CM | POA: Diagnosis not present

## 2016-12-17 DIAGNOSIS — J0410 Acute tracheitis without obstruction: Secondary | ICD-10-CM | POA: Diagnosis not present

## 2016-12-17 MED ORDER — AZITHROMYCIN 250 MG PO TABS: ORAL_TABLET | ORAL | 1 refills | Status: DC

## 2016-12-17 MED ORDER — PREDNISONE 20 MG PO TABS: ORAL_TABLET | ORAL | 1 refills | Status: DC

## 2016-12-18 ENCOUNTER — Encounter: Payer: Self-pay | Admitting: Internal Medicine

## 2016-12-18 NOTE — Progress Notes (Signed)
Subjective:    Patient ID: Michael [name], male    DOB: 1950-09-08, 66 y.o.   MRN: [id number]  HPI  This nice 66 yo DWM pith HTN, HLD, ASCAD and hx/o COPD/asthma prenents with c/o increasing chest congestion, cough and wheezing   Medication Sig  . albuterol (PROVENTIL HFA;VENTOLIN HFA) 108 (90 BASE) MCG/ACT inhaler Inhale 1-2 puffs into the lungs every 6 (six) hours as needed for wheezing or shortness of breath.  Marland Kitchen aspirin EC 81 MG tablet Take 81 mg by mouth daily.  . bisoprolol-hydrochlorothiazide (ZIAC) 10-6.25 MG tablet TAKE ONE TABLET BY MOUTH ONCE DAILY FOR BLOOD PRESSURE  . cholecalciferol (VITAMIN D) 1000 UNITS tablet 1,000 Units. 4000 total a day  . ezetimibe (ZETIA) 10 MG tablet Take 1 tablet (10 mg total) by mouth daily.  . fluticasone furoate-vilanterol (BREO ELLIPTA) 100-25 MCG/INH AEPB Inhale 1 puff into the lungs daily.  . Ginger, Zingiber officinalis, (GINGER PO) Take 1 capsule by mouth daily.  . Multiple Vitamin (MULTIVITAMIN) tablet Take 1 tablet by mouth daily. Takes a vitamin pack daily  . rosuvastatin (CRESTOR) 40 MG tablet Take 1 tablet every other day as directed for cholesterol.  . saw palmetto 500 MG capsule Take 500 mg by mouth daily.   Allergies  Allergen Reactions  . Ace Inhibitors Cough  . Ciprofloxacin   . Fenofibrate     dizzy   Past Medical History:  Diagnosis Date  . BPH (benign prostatic hyperplasia)   . COPD (chronic obstructive pulmonary disease) ([place])    via CT lung  . GERD (gastroesophageal reflux disease)   . Hyperlipidemia   . Hypertension   . Hypogonadism male   . Myocardial infarction [facility]) 2005   Per patient no stent or PTCA, just medical treatment  . Prediabetes   . Vitamin D deficiency    Past Surgical History:  Procedure Laterality Date  . AMPUTATION FINGER / THUMB Left 1991   distal index  . CARDIAC CATHETERIZATION N/A 2005   Per patient no stent or PTCA, just medical treatment  . colonoscopy N/A 10/2012   Neg and recc f/u  Cololnoscopy in 5 yeary - Dr Nicki Reaper [place] - Gastroenterologist  . PROSTATE SURGERY     Biopsy X 3  . VASECTOMY     Review of Systems  10 point systems review negative except as above.    Objective:   Physical Exam   BP (!) 112/50   Pulse 64   Temp (!) 97.2 F (36.2 C)   Resp 18   Ht 5\' 11"  (1.803 m)   Wt 155 lb 6.4 oz (70.5 kg)   BMI 21.67 kg/m   Brassy cough. No stridor.  HEENT - Eac's patent. TM's Nl. EOM's full. PERRLA. NasoOroPharynx clear. Neck - supple. Nl Thyroid. Carotids 2+ & No bruits, nodes, JVD Chest - Decreased  equal BS w/scattered rales, rhonchi, and post tussive wheezes. Cor - Nl HS. RRR w/o sig MGR. No Edema.  MS- FROM w/o deformities. Muscle power, tone and bulk Nl. Gait Nl. Neuro - No obvious Cr N abnormalities.  Nl w/o focal abnormalities    Assessment & Plan:   1. Tracheitis  2. 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  Dispense: 6 each; Refill: 1  - Given Sx's trelegy  X 28 days to use in lieu of Breo  - ROV - prn.

## 2017-02-09 NOTE — Progress Notes (Signed)
[facility]   Unk Pinto, M.D.     Uvaldo Bristle. Silverio Lay, P.A.-C Liane Comber, [place]                [address] [place], [place] [address] Telephone [phone] Telefax [phone] Annual  Screening/Preventative Visit  & Comprehensive Evaluation & Examination     This very nice 66 y.o. DWM presents for a Screening/Preventative Visit & comprehensive evaluation and management of multiple medical co-morbidities.  Patient has been followed for HTN, ASCAD, Prediabetes, Hyperlipidemia and Vitamin D Deficiency.     HTN predates circa 2004. Patient's BP has been controlled at home.  Today's BP is at goal - 118/66.  In 2005, he had an acute MI w/Stents. In 2007, he had a negative Cardiolite and then in 2010 a negative ETT. Patient denies any cardiac symptoms as chest pain, palpitations, shortness of breath, dizziness or ankle swelling.     Patient's hyperlipidemia is not controlled with diet as he is reticent to taking a statin.  Last lipids were not at goal: Lab Results  Component Value Date   CHOL 257 (H) 02/10/2017   HDL 51 02/10/2017   LDLCALC NOT CALC 11/08/2016   TRIG 194 (H) 02/10/2017   CHOLHDL 5.0 (H) 02/10/2017      Patient has prediabetes (A1c 5.7% / 2010) and patient denies reactive hypoglycemic symptoms, visual blurring, diabetic polys or paresthesias. Last A1c was at goal: Lab Results  Component Value Date   HGBA1C 5.3 07/30/2016       Finally, patient has history of Vitamin D Deficiency of  "25 / 2009  and last vitamin D was at goal: Lab Results  Component Value Date   VD25OH 69 07/30/2016   Current Outpatient Prescriptions on File Prior to Visit  Medication Sig  . albuterol (PROVENTIL HFA;VENTOLIN HFA) 108 (90 BASE) MCG/ACT inhaler Inhale 1-2 puffs into the lungs every 6 (six) hours as needed for wheezing or shortness of breath.  Marland Kitchen aspirin EC 81 MG tablet Take 81 mg by mouth  daily.  . bisoprolol-hydrochlorothiazide (ZIAC) 10-6.25 MG tablet TAKE ONE TABLET BY MOUTH ONCE DAILY FOR BLOOD PRESSURE  . cholecalciferol (VITAMIN D) 1000 UNITS tablet 1,000 Units. 4000 total a day  . fluticasone furoate-vilanterol (BREO ELLIPTA) 100-25 MCG/INH AEPB Inhale 1 puff into the lungs daily.  . Ginger, Zingiber officinalis, (GINGER PO) Take 1 capsule by mouth daily.  . Multiple Vitamin (MULTIVITAMIN) tablet Take 1 tablet by mouth daily. Takes a vitamin pack daily  . saw palmetto 500 MG capsule Take 500 mg by mouth daily.  Marland Kitchen ezetimibe (ZETIA) 10 MG tablet Take 1 tablet (10 mg total) by mouth daily. (Patient not taking: Reported on 02/10/2017)  . rosuvastatin (CRESTOR) 40 MG tablet Take 1 tablet every other day as directed for cholesterol. (Patient not taking: Reported on 02/10/2017)   No current facility-administered medications on file prior to visit.    Allergies  Allergen Reactions  . Ace Inhibitors Cough  . Ciprofloxacin   . Fenofibrate     dizzy   Past Medical History:  Diagnosis Date  . BPH (benign prostatic hyperplasia)   . COPD (chronic obstructive pulmonary disease) ([place])    via CT lung  . GERD (gastroesophageal reflux disease)   . Hyperlipidemia   . Hypertension   . Hypogonadism male   . Myocardial infarction [facility]) 2005  Per patient no stent or PTCA, just medical treatment  . Prediabetes   . Vitamin D deficiency    Health Maintenance  Topic Date Due  . TETANUS/TDAP  07/11/1968  . PNA vac Low Risk Adult (1 of 2 - PCV13) 07/11/2014  . INFLUENZA VACCINE  12/22/2016  . COLONOSCOPY  11/08/2022  . Hepatitis C Screening  Completed   Immunization History  Administered Date(s) Administered  . PPD Test 12/25/2013  . Pneumococcal-Unspecified 12/08/2010  . Tdap 12/08/2010   Past Surgical History:  Procedure Laterality Date  . AMPUTATION FINGER / THUMB Left 1991   distal index  . CARDIAC CATHETERIZATION N/A 2005   Per patient no stent or PTCA, just medical  treatment  . colonoscopy N/A 10/2012   Neg and recc f/u Cololnoscopy in 5 yeary - Dr Nicki Reaper [place] - Gastroenterologist  . PROSTATE SURGERY     Biopsy X 3  . VASECTOMY     Family History  Problem Relation Age of Onset  . Cancer Father 7       lymphoma  . Cancer Maternal Aunt        Lung  . Cancer Maternal Uncle        Lung  . Cancer Maternal Uncle        Lung  . Stroke Maternal Grandmother   . Heart failure Maternal Grandfather   . Pneumonia Paternal Grandmother   . Liver disease Paternal Grandfather   . Stroke Son   . Other Son        internal intestinal hemorage   Social History   Social History  . Marital status: Married    Spouse name: N/A  . Number of children: N/A  . Years of education: N/A   Occupational History  . Not on file.   Social History Main Topics  . Smoking status: Current Every Day Smoker    Packs/day: 1.00    Years: 36.00    Types: Cigarettes  . Smokeless tobacco: Never Used  . Alcohol use 3.0 oz/week    5 Cans of beer per week  . Drug use: No  . Sexual activity: Not on file   Social History Narrative   Epworth Sleepiness score 5     ROS Constitutional: Denies fever, chills, weight loss/gain, headaches, insomnia,  night sweats or change in appetite. Does c/o fatigue. Eyes: Denies redness, blurred vision, diplopia, discharge, itchy or watery eyes.  ENT: Denies discharge, congestion, post nasal drip, epistaxis, sore throat, earache, hearing loss, dental pain, Tinnitus, Vertigo, Sinus pain or snoring.  Cardio: Denies chest pain, palpitations, irregular heartbeat, syncope, dyspnea, diaphoresis, orthopnea, PND, claudication or edema Respiratory: denies cough, dyspnea, DOE, pleurisy, hoarseness, laryngitis or wheezing.  Gastrointestinal: Denies dysphagia, heartburn, reflux, water brash, pain, cramps, nausea, vomiting, bloating, diarrhea, constipation, hematemesis, melena, hematochezia, jaundice or hemorrhoids Genitourinary: Denies dysuria,  frequency, urgency, nocturia, hesitancy, discharge, hematuria or flank pain Musculoskeletal: Denies arthralgia, myalgia, stiffness, Jt. Swelling, pain, limp or strain/sprain. Denies Falls. Skin: Denies puritis, rash, hives, warts, acne, eczema or change in skin lesion Neuro: No weakness, tremor, incoordination, spasms, paresthesia or pain Psychiatric: Denies confusion, memory loss or sensory loss. Denies Depression. Endocrine: Denies change in weight, skin, hair change, nocturia, and paresthesia, diabetic polys, visual blurring or hyper / hypo glycemic episodes.  Heme/Lymph: No excessive bleeding, bruising or enlarged lymph nodes.  Physical Exam  BP 118/66   Pulse (!) 56   Temp (!) 97.2 F (36.2 C)   Resp 16   Ht 6' (1.829 m)  Wt 156 lb 3.2 oz (70.9 kg)   BMI 21.18 kg/m   General Appearance: Well nourished and well groomed and in no apparent distress.  Eyes: PERRLA, EOMs, conjunctiva no swelling or erythema, normal fundi and vessels. Sinuses: No frontal/maxillary tenderness ENT/Mouth: EACs patent / TMs  nl. Nares clear without erythema, swelling, mucoid exudates. Oral hygiene is good. No erythema, swelling, or exudate. Tongue normal, non-obstructing. Tonsils not swollen or erythematous. Hearing normal.  Neck: Supple, thyroid normal. No bruits, nodes or JVD. Respiratory: Respiratory effort normal.  BS equal and clear bilateral without rales, rhonci, wheezing or stridor. Cardio: Heart sounds are normal with regular rate and rhythm and no murmurs, rubs or gallops. Peripheral pulses are normal and equal bilaterally without edema. No aortic or femoral bruits. Chest: symmetric with normal excursions and percussion.  Abdomen: Soft, with Nl bowel sounds. Nontender, no guarding, rebound, hernias, masses, or organomegaly.  Lymphatics: Non tender without lymphadenopathy.  Genitourinary: Patient deferred DRE today due to "diarrhea"  Musculoskeletal: Full ROM all peripheral extremities, joint  stability, 5/5 strength, and normal gait. Skin: Warm and dry without rashes, lesions, cyanosis, clubbing or  ecchymosis.  Neuro: Cranial nerves intact, reflexes equal bilaterally. Normal muscle tone, no cerebellar symptoms. Sensation intact.  Pysch: Alert and oriented X 3 with normal affect, insight and judgment appropriate.   Assessment and Plan  1. Annual Preventative/Screening Exam   2. Essential hypertension  - DG Chest 2 View; Future - EKG 12-Lead - [place], RETROPERITNL ABD,  LTD - Urinalysis, Routine w reflex microscopic - Microalbumin / creatinine urine ratio - CBC with Differential/Platelet - BASIC METABOLIC PANEL WITH GFR - Magnesium - TSH  3. Hyperlipidemia, mixed  - EKG 12-Lead - [place], RETROPERITNL ABD,  LTD - Hepatic function panel - Lipid panel - TSH  4. Prediabetes  - EKG 12-Lead - [place], RETROPERITNL ABD,  LTD - Hemoglobin A1c - Insulin, fasting  5. Vitamin D deficiency  - VITAMIN D 25 Hydroxy   6. ASHD (arteriosclerotic heart disease)  - EKG 12-Lead - Lipid panel  7. Screening for ischemic heart disease  - EKG 12-Lead  8. Screening for AAA (aortic abdominal aneurysm)  - [place], RETROPERITNL ABD,  LTD  9. Screening for colorectal cancer  - POC Hemoccult Bld/Stl   10. Screening for prostate cancer  - PSA  11. Elevated PSA  - PSA  12. Fatigue  - Iron,Total/Total Iron Binding Cap - Vitamin B12 - TSH  13. Medication management  - Urinalysis, Routine w reflex microscopic - Microalbumin / creatinine urine ratio         Patient was counseled in prudent diet, weight control to achieve/maintain BMI less than 25, BP monitoring, regular exercise and medications as discussed.  Discussed med effects and SE's. Routine screening labs and tests as requested with regular follow-up as recommended. Over 40 minutes of exam, counseling, chart review and high complex critical decision making was performed

## 2017-02-09 NOTE — Patient Instructions (Signed)

## 2017-02-10 ENCOUNTER — Encounter: Payer: Self-pay | Admitting: Internal Medicine

## 2017-02-10 ENCOUNTER — Ambulatory Visit (INDEPENDENT_AMBULATORY_CARE_PROVIDER_SITE_OTHER): Payer: 59 | Admitting: Internal Medicine

## 2017-02-10 VITALS — BP 118/66 | HR 56 | Temp 97.2°F | Resp 16 | Ht 72.0 in | Wt 156.2 lb

## 2017-02-10 DIAGNOSIS — R7303 Prediabetes: Secondary | ICD-10-CM

## 2017-02-10 DIAGNOSIS — I2510 Atherosclerotic heart disease of native coronary artery without angina pectoris: Secondary | ICD-10-CM

## 2017-02-10 DIAGNOSIS — I10 Essential (primary) hypertension: Secondary | ICD-10-CM | POA: Diagnosis not present

## 2017-02-10 DIAGNOSIS — Z136 Encounter for screening for cardiovascular disorders: Secondary | ICD-10-CM

## 2017-02-10 DIAGNOSIS — E559 Vitamin D deficiency, unspecified: Secondary | ICD-10-CM

## 2017-02-10 DIAGNOSIS — Z79899 Other long term (current) drug therapy: Secondary | ICD-10-CM

## 2017-02-10 DIAGNOSIS — Z0001 Encounter for general adult medical examination with abnormal findings: Secondary | ICD-10-CM

## 2017-02-10 DIAGNOSIS — R5383 Other fatigue: Secondary | ICD-10-CM

## 2017-02-10 DIAGNOSIS — Z1211 Encounter for screening for malignant neoplasm of colon: Secondary | ICD-10-CM

## 2017-02-10 DIAGNOSIS — Z0000 Encounter for general adult medical examination without abnormal findings: Secondary | ICD-10-CM

## 2017-02-10 DIAGNOSIS — E782 Mixed hyperlipidemia: Secondary | ICD-10-CM

## 2017-02-10 DIAGNOSIS — R972 Elevated prostate specific antigen [PSA]: Secondary | ICD-10-CM

## 2017-02-10 DIAGNOSIS — Z1212 Encounter for screening for malignant neoplasm of rectum: Secondary | ICD-10-CM

## 2017-02-10 DIAGNOSIS — Z125 Encounter for screening for malignant neoplasm of prostate: Secondary | ICD-10-CM

## 2017-02-11 LAB — HEPATIC FUNCTION PANEL
AG RATIO: 1.9 (calc) (ref 1.0–2.5)
ALKALINE PHOSPHATASE (APISO): 82 U/L (ref 40–115)
ALT: 16 U/L (ref 9–46)
AST: 20 U/L (ref 10–35)
Albumin: 4.2 g/dL (ref 3.6–5.1)
BILIRUBIN DIRECT: 0.1 mg/dL (ref 0.0–0.2)
BILIRUBIN INDIRECT: 1 mg/dL (ref 0.2–1.2)
Globulin: 2.2 g/dL (calc) (ref 1.9–3.7)
TOTAL PROTEIN: 6.4 g/dL (ref 6.1–8.1)
Total Bilirubin: 1.1 mg/dL (ref 0.2–1.2)

## 2017-02-11 LAB — IRON, TOTAL/TOTAL IRON BINDING CAP
%SAT: 37 % (calc) (ref 15–60)
IRON: 137 ug/dL (ref 50–180)
TIBC: 371 ug/dL (ref 250–425)

## 2017-02-11 LAB — PSA: PSA: 7.2 ng/mL — ABNORMAL HIGH (ref <=4.0)

## 2017-02-11 LAB — CBC WITH DIFFERENTIAL/PLATELET
Basophils Absolute: 141 cells/uL (ref 0–200)
Basophils Relative: 1.4 %
EOS ABS: 172 {cells}/uL (ref 15–500)
Eosinophils Relative: 1.7 %
HCT: 41.1 % (ref 38.5–50.0)
HEMOGLOBIN: 14.2 g/dL (ref 13.2–17.1)
Lymphs Abs: 2677 cells/uL (ref 850–3900)
MCH: 32.2 pg (ref 27.0–33.0)
MCHC: 34.5 g/dL (ref 32.0–36.0)
MCV: 93.2 fL (ref 80.0–100.0)
MONOS PCT: 8.2 %
MPV: 9.6 fL (ref 7.5–12.5)
NEUTROS ABS: 6282 {cells}/uL (ref 1500–7800)
Neutrophils Relative %: 62.2 %
Platelets: 167 10*3/uL (ref 140–400)
RBC: 4.41 10*6/uL (ref 4.20–5.80)
RDW: 12.6 % (ref 11.0–15.0)
TOTAL LYMPHOCYTE: 26.5 %
WBC mixed population: 828 cells/uL (ref 200–950)
WBC: 10.1 10*3/uL (ref 3.8–10.8)

## 2017-02-11 LAB — BASIC METABOLIC PANEL WITH GFR
BUN: 18 mg/dL (ref 7–25)
CO2: 28 mmol/L (ref 20–32)
CREATININE: 0.97 mg/dL (ref 0.70–1.25)
Calcium: 9.1 mg/dL (ref 8.6–10.3)
Chloride: 105 mmol/L (ref 98–110)
GFR, Est African American: 93 mL/min/{1.73_m2} (ref >=60)
GFR, Est Non African American: 80 mL/min/{1.73_m2} (ref >=60)
GLUCOSE: 91 mg/dL (ref 65–99)
Potassium: 4 mmol/L (ref 3.5–5.3)
SODIUM: 139 mmol/L (ref 135–146)

## 2017-02-11 LAB — HEMOGLOBIN A1C
Hgb A1c MFr Bld: 5.2 % of total Hgb (ref <5.7)
Mean Plasma Glucose: 103 (calc)
eAG (mmol/L): 5.7 (calc)

## 2017-02-11 LAB — VITAMIN B12: VITAMIN B 12: 486 pg/mL (ref 200–1100)

## 2017-02-11 LAB — LIPID PANEL
CHOL/HDL RATIO: 5 (calc) — AB (ref <5.0)
CHOLESTEROL: 257 mg/dL — AB (ref <200)
HDL: 51 mg/dL (ref >40)
LDL Cholesterol (Calc): 171 mg/dL (calc) — ABNORMAL HIGH
Non-HDL Cholesterol (Calc): 206 mg/dL (calc) — ABNORMAL HIGH (ref <130)
Triglycerides: 194 mg/dL — ABNORMAL HIGH (ref <150)

## 2017-02-11 LAB — MAGNESIUM: Magnesium: 2.1 mg/dL (ref 1.5–2.5)

## 2017-02-11 LAB — VITAMIN D 25 HYDROXY (VIT D DEFICIENCY, FRACTURES): Vit D, 25-Hydroxy: 85 ng/mL (ref 30–100)

## 2017-02-11 LAB — TSH: TSH: 1.52 mIU/L (ref 0.40–4.50)

## 2017-02-11 LAB — INSULIN, RANDOM: Insulin: 3.4 u[IU]/mL (ref 2.0–19.6)

## 2017-02-14 ENCOUNTER — Other Ambulatory Visit: Payer: Self-pay | Admitting: *Deleted

## 2017-02-14 MED ORDER — FLUTICASONE FUROATE-VILANTEROL 100-25 MCG/INH IN AEPB: 1.0000 | INHALATION_SPRAY | Freq: Every day | RESPIRATORY_TRACT | 6 refills | Status: DC

## 2017-02-14 MED ORDER — EZETIMIBE 10 MG PO TABS: 10.0000 mg | ORAL_TABLET | Freq: Every day | ORAL | 11 refills | Status: DC

## 2017-02-15 LAB — URINALYSIS, ROUTINE W REFLEX MICROSCOPIC
Bilirubin Urine: NEGATIVE
GLUCOSE, UA: NEGATIVE
Hgb urine dipstick: NEGATIVE
Ketones, ur: NEGATIVE
Leukocytes, UA: NEGATIVE
Nitrite: NEGATIVE
PH: 7 (ref 5.0–8.0)
PROTEIN: NEGATIVE
Specific Gravity, Urine: 1.021 (ref 1.001–1.03)

## 2017-02-15 LAB — MICROALBUMIN / CREATININE URINE RATIO
Creatinine, Urine: 141 mg/dL (ref 20–320)
MICROALB/CREAT RATIO: 3 ug/mg{creat} (ref <30)
Microalb, Ur: 0.4 mg/dL

## 2017-05-19 NOTE — Progress Notes (Signed)
MEDICARE ANNUAL WELLNESS VISIT AND FOLLOW UP Assessment:   Diagnoses and all orders for this visit:  Encounter for Medicare annual wellness exam  Essential hypertension At goal; continue medication Monitor blood pressure at home; call if consistently over 130/80 Continue DASH diet.   Reminder to go to the ER if any CP, SOB, nausea, dizziness, severe HA, changes vision/speech, left arm numbness and tingling and jaw pain.  ASHD s/p Stent Continue follow up with [name] Strongly recommended he reduce or stop smoking Control blood pressure, cholesterol, glucose, increase exercise.   COPD Advised to stop smoking, continue medications Advised prevnar13 today followed by pneumovax next year Will obtain low dose CT in lieu of CXR due to 36 pack year smoking hx   Benign prostatic hyperplasia with lower urinary tract symptoms, symptom details unspecified Previously established with Dr. Janice [name]; monitored by this office; will refer back as indicated Currently reports urinary symptoms are well controlled; he takes saw palmetto for symptoms and declines further medications  Vitamin D deficiency Continue supplementation; at goal  Check vitamin D level biannually or as needed  Mixed hyperlipidemia Continue follow up with [name]; goal LDL <70 per cardiology Continue medications: zetia 10 mg daily, rosuvastatin 40 mg has not been taking with questionable muscle cramps in calves that are intermittently ongoing - did not improve with cessation of statin. He will try a lower dose - 20 mg once weekly and evaluate cramping -  Continue low cholesterol diet and exercise.  -     Lipid panel -     TSH  Medication management -     CBC with Differential/Platelet -     BASIC METABOLIC PANEL WITH GFR -     Hepatic function panel  Tobacco use disorder 36 pack year hx; Discussed risks associated with tobacco use and advised to reduce or quit Patient is not ready to do so, but advised to consider  strongly Will follow up at the next visit Will order low dose screening CT  History of amputation of finger of left hand  Thrombocytopenia (HCC) -     CBC with Differential/Platelet  Elevated PSA     Monitored by this office; stable at this time; refer back to urology as indicated  Other abnormal glucose Discussed disease and risks of elevated glucose Discussed diet/exercise, weight management  A1Cs at goal over past year; defer - check annually or q6 months unless elevated -     BASIC METABOLIC PANEL WITH GFR  Over 30 minutes of exam, counseling, chart review, and critical decision making was performed  Future Appointments  Date Time Provider [place]  08/23/2017 10:30 AM Unk Pinto, MD [facility] None  02/28/2018  9:00 AM Unk Pinto, MD [facility] None     Plan:   During the course of the visit the patient was educated and counseled about appropriate screening and preventive services including:    Pneumococcal vaccine   Influenza vaccine  Prevnar 13  Td vaccine  Screening electrocardiogram  Colorectal cancer screening  Diabetes screening  Glaucoma screening  Nutrition counseling    Subjective:  Michael [name] is a 66 y.o. male with hx of MI in 2005 with cath; he has had negative cardiolite in 2007 and neg ETT in 2010. He is recently established with Dr. 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 He presents for Medicare Annual Wellness Visit and 3 month follow up for HTN, hyperlipidemia, glucose  monitoring, and vitamin D Def. He is previously established with Dr. Janice [name] for BPH/elevated PSAs which have been stable- negative biopsy x3 in the past -  he has not been seen in 2+ years after Dr. Janice [name] retired.   His blood pressure has been controlled at home, today their BP is BP: 122/80 He does not workout - works at a physically strenuous job, walking all day. He denies  chest pain, shortness of breath, dizziness.   He is on cholesterol medication: zetia 10 mg daily - he has been taking every other day, rosuvastatin 40 mg which he has not taken in over a month - he has hx of poorly tolerating statins with muscle cramp, though he reports cramps have not improved off of medication. His cholesterol is not at goal. The cholesterol last visit was:   Lab Results  Component Value Date   CHOL 257 (H) 02/10/2017   HDL 51 02/10/2017   LDLCALC NOT CALC 11/08/2016   TRIG 194 (H) 02/10/2017   CHOLHDL 5.0 (H) 02/10/2017   He has been working on diet and exercise for glucose management, and denies increased appetite, nausea, paresthesia of the feet, polydipsia, polyuria, visual disturbances and vomiting. Last A1C in the office was:  Lab Results  Component Value Date   HGBA1C 5.2 02/10/2017   Last GFR Lab Results  Component Value Date   GFRNONAA 80 02/10/2017    Patient is on Vitamin D supplement and at goal at the last check:    Lab Results  Component Value Date   VD25OH 85 02/10/2017      Medication Review: Current Outpatient Medications on File Prior to Visit  Medication Sig Dispense Refill  . albuterol (PROVENTIL HFA;VENTOLIN HFA) 108 (90 BASE) MCG/ACT inhaler Inhale 1-2 puffs into the lungs every 6 (six) hours as needed for wheezing or shortness of breath. 18 g 2  . aspirin EC 81 MG tablet Take 81 mg by mouth daily.    . bisoprolol-hydrochlorothiazide (ZIAC) 10-6.25 MG tablet TAKE ONE TABLET BY MOUTH ONCE DAILY FOR BLOOD PRESSURE 90 tablet 1  . cholecalciferol (VITAMIN D) 1000 UNITS tablet 1,000 Units. 4000 total a day    . ezetimibe (ZETIA) 10 MG tablet Take 1 tablet (10 mg total) by mouth daily. 30 tablet 11  . fluticasone furoate-vilanterol (BREO ELLIPTA) 100-25 MCG/INH AEPB Inhale 1 puff into the lungs daily. 60 each 6  . Ginger, Zingiber officinalis, (GINGER PO) Take 1 capsule by mouth daily.    . Multiple Vitamin (MULTIVITAMIN) tablet Take 1 tablet by  mouth daily. Takes a vitamin pack daily    . rosuvastatin (CRESTOR) 40 MG tablet Take 1 tablet every other day as directed for cholesterol. (Patient not taking: Reported on 02/10/2017) 30 tablet 6  . saw palmetto 500 MG capsule Take 500 mg by mouth daily.     No current facility-administered medications on file prior to visit.     Allergies: Allergies  Allergen Reactions  . Ace Inhibitors Cough  . Ciprofloxacin   . Fenofibrate     dizzy    Current Problems (verified) has BPH (benign prostatic hyperplasia); Vitamin D deficiency; COPD (chronic obstructive pulmonary disease) ([place]); Hypertension; Hyperlipidemia; Medication management; Other abnormal glucose; Tobacco use disorder; ASHD s/p Stent; History of amputation of finger of left hand; Thrombocytopenia ([place]); and Elevated PSA on their problem list.  Screening Tests Immunization History  Administered Date(s) Administered  . PPD Test 12/25/2013  . Pneumococcal-Unspecified 12/08/2010  . Tdap 12/08/2010   Preventative care: Last  colonoscopy: 2014 due 2019  Prior vaccinations: TD or Tdap: 2012  Influenza: never had, declines Pneumococcal: 2012, due 1 year after prevnar13 Prevnar13: DUE - willing to get, not available in office today Shingles/Zostavax: not available in office   Names of Other Physician/Practitioners you currently use: 1. [facility] here for primary care 2. Dr. Nicki Reaper, eye doctor, last visit 2016 3. Dr. Ludger Nutting, dentist, last visit 2015 - will schedule to follow up  Patient Care Team: Unk Pinto, MD as PCP - General (Internal Medicine) Lowella Bandy, MD as Consulting Physician (Urology)  Surgical: He  has a past surgical history that includes Prostate surgery; Amputation finger / thumb (Left, 1991); Vasectomy; colonoscopy (N/A, 10/2012); and Cardiac catheterization (N/A, 2005). Family His family history includes Cancer in his maternal aunt, maternal uncle, and  maternal uncle; Cancer (age of onset: 50) in his father; Heart failure in his maternal grandfather; Liver disease in his paternal grandfather; Other in his son; Pneumonia in his paternal grandmother; Stroke in his maternal grandmother and son. Social history  He reports that he has been smoking cigarettes.  He has a 36.00 pack-year smoking history. he has never used smokeless tobacco. He reports that he drinks about 3.0 oz of alcohol per week. He reports that he does not use drugs.  MEDICARE WELLNESS OBJECTIVES: Physical activity: Current Exercise Habits: The patient has a physically strenous job, but has no regular exercise apart from work., Type of exercise: walking, Time (Minutes): 45, Frequency (Times/Week): 7, Weekly Exercise (Minutes/Week): 315, Intensity: Mild Cardiac risk factors: Cardiac Risk Factors include: advanced age (>105men, >37 women);dyslipidemia;hypertension;male gender;smoking/ tobacco exposure Depression/mood screen:   Depression screen [facility] 2/9 05/20/2017  Decreased Interest 0  Down, Depressed, Hopeless 0  PHQ - 2 Score 0    ADLs:  In your present state of health, do you have any difficulty performing the following activities: 05/20/2017 02/10/2017  Hearing? N N  Vision? N N  Difficulty concentrating or making decisions? N N  Walking or climbing stairs? N N  Dressing or bathing? N N  Doing errands, shopping? N N  Some recent data might be hidden     Cognitive Testing  Alert? Yes  Normal Appearance?Yes  Oriented to person? Yes  Place? Yes   Time? Yes  Recall of three objects?  Yes  Can perform simple calculations? Yes  Displays appropriate judgment?Yes  Can read the correct time from a watch face?Yes  EOL planning: Does Patient Have a Medical Advance Directive?: No Would patient like information on creating a medical advance directive?: Yes (MAU/Ambulatory/Procedural Areas - Information given)   Objective:   Today's Vitals   05/20/17 1007  BP: 122/80   Pulse: 61  Temp: 97.9 F (36.6 C)  SpO2: 98%  Weight: 158 lb (71.7 kg)  Height: 6' (1.829 m)   Body mass index is 21.43 kg/m.  General appearance: alert, no distress, WD/WN, male HEENT: normocephalic, sclerae anicteric, TMs pearly, nares patent, no discharge or erythema, pharynx normal Oral cavity: MMM, no lesions Neck: supple, no lymphadenopathy, no thyromegaly, no masses Heart: RRR, normal S1, S2, no murmurs Lungs: CTA bilaterally, no wheezes, rhonchi, or rales Abdomen: +bs, soft, non tender, non distended, no masses, no hepatomegaly, no splenomegaly Musculoskeletal: nontender, no swelling, no obvious deformity Extremities: no edema, no cyanosis, no clubbing Pulses: 2+ symmetric, upper and lower extremities, normal cap refill Neurological: alert, oriented x 3, CN2-12 intact, strength normal upper extremities and lower extremities, sensation normal throughout, DTRs 2+ throughout, no  cerebellar signs, gait normal Psychiatric: normal affect, behavior normal, pleasant   Medicare Attestation I have personally reviewed: The patient's medical and social history Their use of alcohol, tobacco or illicit drugs Their current medications and supplements The patient's functional ability including ADLs,fall risks, home safety risks, cognitive, and hearing and visual impairment Diet and physical activities Evidence for depression or mood disorders  The patient's weight, height, BMI, and visual acuity have been recorded in the chart.  I have made referrals, counseling, and provided education to the patient based on review of the above and I have provided the patient with a written personalized care plan for preventive services.     Izora Ribas, NP   05/20/2017

## 2017-05-20 ENCOUNTER — Encounter: Payer: Self-pay | Admitting: Adult Health

## 2017-05-20 ENCOUNTER — Ambulatory Visit (INDEPENDENT_AMBULATORY_CARE_PROVIDER_SITE_OTHER): Payer: 59 | Admitting: Adult Health

## 2017-05-20 VITALS — BP 122/80 | HR 61 | Temp 97.9°F | Ht 72.0 in | Wt 158.0 lb

## 2017-05-20 DIAGNOSIS — R972 Elevated prostate specific antigen [PSA]: Secondary | ICD-10-CM

## 2017-05-20 DIAGNOSIS — R6889 Other general symptoms and signs: Secondary | ICD-10-CM | POA: Diagnosis not present

## 2017-05-20 DIAGNOSIS — E782 Mixed hyperlipidemia: Secondary | ICD-10-CM

## 2017-05-20 DIAGNOSIS — D696 Thrombocytopenia, unspecified: Secondary | ICD-10-CM

## 2017-05-20 DIAGNOSIS — Z0000 Encounter for general adult medical examination without abnormal findings: Secondary | ICD-10-CM

## 2017-05-20 DIAGNOSIS — E559 Vitamin D deficiency, unspecified: Secondary | ICD-10-CM | POA: Diagnosis not present

## 2017-05-20 DIAGNOSIS — Z0001 Encounter for general adult medical examination with abnormal findings: Secondary | ICD-10-CM

## 2017-05-20 DIAGNOSIS — I2510 Atherosclerotic heart disease of native coronary artery without angina pectoris: Secondary | ICD-10-CM

## 2017-05-20 DIAGNOSIS — I10 Essential (primary) hypertension: Secondary | ICD-10-CM

## 2017-05-20 DIAGNOSIS — R7309 Other abnormal glucose: Secondary | ICD-10-CM | POA: Diagnosis not present

## 2017-05-20 DIAGNOSIS — N401 Enlarged prostate with lower urinary tract symptoms: Secondary | ICD-10-CM

## 2017-05-20 DIAGNOSIS — Z89022 Acquired absence of left finger(s): Secondary | ICD-10-CM

## 2017-05-20 DIAGNOSIS — Z122 Encounter for screening for malignant neoplasm of respiratory organs: Secondary | ICD-10-CM

## 2017-05-20 DIAGNOSIS — Z79899 Other long term (current) drug therapy: Secondary | ICD-10-CM

## 2017-05-20 DIAGNOSIS — F17200 Nicotine dependence, unspecified, uncomplicated: Secondary | ICD-10-CM

## 2017-05-20 DIAGNOSIS — J449 Chronic obstructive pulmonary disease, unspecified: Secondary | ICD-10-CM

## 2017-05-20 LAB — CBC WITH DIFFERENTIAL/PLATELET
Basophils Absolute: 138 cells/uL (ref 0–200)
Basophils Relative: 1.3 %
EOS ABS: 127 {cells}/uL (ref 15–500)
Eosinophils Relative: 1.2 %
HEMATOCRIT: 43.8 % (ref 38.5–50.0)
Hemoglobin: 15.1 g/dL (ref 13.2–17.1)
LYMPHS ABS: 2724 {cells}/uL (ref 850–3900)
MCH: 32.1 pg (ref 27.0–33.0)
MCHC: 34.5 g/dL (ref 32.0–36.0)
MCV: 93.2 fL (ref 80.0–100.0)
MPV: 10.1 fL (ref 7.5–12.5)
Monocytes Relative: 9.3 %
Neutro Abs: 6625 cells/uL (ref 1500–7800)
Neutrophils Relative %: 62.5 %
PLATELETS: 157 10*3/uL (ref 140–400)
RBC: 4.7 10*6/uL (ref 4.20–5.80)
RDW: 11.8 % (ref 11.0–15.0)
TOTAL LYMPHOCYTE: 25.7 %
WBC: 10.6 10*3/uL (ref 3.8–10.8)
WBCMIX: 986 {cells}/uL — AB (ref 200–950)

## 2017-05-20 LAB — HEPATIC FUNCTION PANEL
AG RATIO: 2.2 (calc) (ref 1.0–2.5)
ALBUMIN MSPROF: 4.3 g/dL (ref 3.6–5.1)
ALT: 18 U/L (ref 9–46)
AST: 23 U/L (ref 10–35)
Alkaline phosphatase (APISO): 90 U/L (ref 40–115)
BILIRUBIN DIRECT: 0.2 mg/dL (ref 0.0–0.2)
BILIRUBIN INDIRECT: 0.9 mg/dL (ref 0.2–1.2)
BILIRUBIN TOTAL: 1.1 mg/dL (ref 0.2–1.2)
GLOBULIN: 2 g/dL (ref 1.9–3.7)
Total Protein: 6.3 g/dL (ref 6.1–8.1)

## 2017-05-20 LAB — TSH: TSH: 2.23 m[IU]/L (ref 0.40–4.50)

## 2017-05-20 LAB — BASIC METABOLIC PANEL WITH GFR
BUN: 14 mg/dL (ref 7–25)
CALCIUM: 9.4 mg/dL (ref 8.6–10.3)
CO2: 30 mmol/L (ref 20–32)
CREATININE: 1.06 mg/dL (ref 0.70–1.25)
Chloride: 101 mmol/L (ref 98–110)
GFR, EST AFRICAN AMERICAN: 84 mL/min/{1.73_m2} (ref >=60)
GFR, EST NON AFRICAN AMERICAN: 72 mL/min/{1.73_m2} (ref >=60)
Glucose, Bld: 91 mg/dL (ref 65–99)
Potassium: 4.4 mmol/L (ref 3.5–5.3)
Sodium: 139 mmol/L (ref 135–146)

## 2017-05-20 LAB — LIPID PANEL
CHOL/HDL RATIO: 4.2 (calc) (ref <5.0)
CHOLESTEROL: 212 mg/dL — AB (ref <200)
HDL: 50 mg/dL (ref >40)
LDL CHOLESTEROL (CALC): 115 mg/dL — AB
NON-HDL CHOLESTEROL (CALC): 162 mg/dL — AB (ref <130)
Triglycerides: 328 mg/dL — ABNORMAL HIGH (ref <150)

## 2017-05-20 NOTE — Patient Instructions (Signed)

## 2017-07-19 ENCOUNTER — Encounter: Payer: Self-pay | Admitting: Adult Health

## 2017-07-19 ENCOUNTER — Ambulatory Visit
Admission: RE | Admit: 2017-07-19 | Discharge: 2017-07-19 | Disposition: A | Discharge status: Home / Self Care | Payer: 59 | Source: Ambulatory Visit | Attending: Adult Health | Admitting: Adult Health

## 2017-07-19 DIAGNOSIS — F17200 Nicotine dependence, unspecified, uncomplicated: Secondary | ICD-10-CM

## 2017-07-19 DIAGNOSIS — Z122 Encounter for screening for malignant neoplasm of respiratory organs: Secondary | ICD-10-CM

## 2017-07-19 DIAGNOSIS — I700 Atherosclerosis of aorta: Secondary | ICD-10-CM | POA: Insufficient documentation

## 2017-08-23 ENCOUNTER — Ambulatory Visit: Payer: Self-pay | Admitting: Internal Medicine

## 2017-09-21 ENCOUNTER — Ambulatory Visit: Payer: 59 | Admitting: Internal Medicine

## 2017-09-21 VITALS — BP 126/68 | HR 56 | Temp 97.0°F | Resp 18 | Ht 72.0 in | Wt 153.0 lb

## 2017-09-21 DIAGNOSIS — Z79899 Other long term (current) drug therapy: Secondary | ICD-10-CM

## 2017-09-21 DIAGNOSIS — I2510 Atherosclerotic heart disease of native coronary artery without angina pectoris: Secondary | ICD-10-CM

## 2017-09-21 DIAGNOSIS — E559 Vitamin D deficiency, unspecified: Secondary | ICD-10-CM

## 2017-09-21 DIAGNOSIS — R7303 Prediabetes: Secondary | ICD-10-CM

## 2017-09-21 DIAGNOSIS — I10 Essential (primary) hypertension: Secondary | ICD-10-CM

## 2017-09-21 DIAGNOSIS — E782 Mixed hyperlipidemia: Secondary | ICD-10-CM

## 2017-09-21 LAB — CBC WITH DIFFERENTIAL/PLATELET
Basophils Absolute: 113 cells/uL (ref 0–200)
Basophils Relative: 1.3 %
EOS PCT: 1.4 %
Eosinophils Absolute: 122 cells/uL (ref 15–500)
HEMATOCRIT: 40.4 % (ref 38.5–50.0)
Hemoglobin: 14 g/dL (ref 13.2–17.1)
LYMPHS ABS: 2445 {cells}/uL (ref 850–3900)
MCH: 32.1 pg (ref 27.0–33.0)
MCHC: 34.7 g/dL (ref 32.0–36.0)
MCV: 92.7 fL (ref 80.0–100.0)
MONOS PCT: 8 %
MPV: 10 fL (ref 7.5–12.5)
NEUTROS PCT: 61.2 %
Neutro Abs: 5324 cells/uL (ref 1500–7800)
PLATELETS: 134 10*3/uL — AB (ref 140–400)
RBC: 4.36 10*6/uL (ref 4.20–5.80)
RDW: 12.3 % (ref 11.0–15.0)
TOTAL LYMPHOCYTE: 28.1 %
WBC mixed population: 696 cells/uL (ref 200–950)
WBC: 8.7 10*3/uL (ref 3.8–10.8)

## 2017-09-21 LAB — TSH: TSH: 1.33 m[IU]/L (ref 0.40–4.50)

## 2017-09-21 NOTE — Progress Notes (Signed)
                      R  E  S  C  H  E  D  U  L  E  D   due       to    P R O  V  I  D  E  R ' S               S  C  H  E  D  U  L  E                       This very nice 67 y.o.male presents for 3 month follow up with HTN, HLD, Pre-Diabetes and Vitamin D Deficiency.      Patient is treated for HTN & BP has been controlled at home. Today's  . Patient has had no complaints of any cardiac type chest pain, palpitations, dyspnea / orthopnea / PND, dizziness, claudication, or dependent edema.     Hyperlipidemia is controlled with diet & meds. Patient denies myalgias or other med SE's. Last Lipids were  Lab Results  Component Value Date   CHOL 212 (H) 05/20/2017   HDL 50 05/20/2017   LDLCALC 115 (H) 05/20/2017   TRIG 328 (H) 05/20/2017   CHOLHDL 4.2 05/20/2017      Also, the patient has history of T2_NIDDM PreDiabetes and has had no symptoms of reactive hypoglycemia, diabetic polys, paresthesias or visual blurring.  Last A1c was  Lab Results  Component Value Date   HGBA1C 5.2 02/10/2017      Further, the patient also has history of Vitamin D Deficiency and supplements vitamin D without any suspected side-effects. Last vitamin D was   Lab Results  Component Value Date   VD25OH 85 02/10/2017

## 2017-09-22 LAB — HEMOGLOBIN A1C
HEMOGLOBIN A1C: 5.4 %{Hb} (ref <5.7)
Mean Plasma Glucose: 108 (calc)
eAG (mmol/L): 6 (calc)

## 2017-09-22 LAB — LIPID PANEL
Cholesterol: 175 mg/dL (ref <200)
HDL: 47 mg/dL (ref >40)
LDL CHOLESTEROL (CALC): 101 mg/dL — AB
NON-HDL CHOLESTEROL (CALC): 128 mg/dL (ref <130)
TRIGLYCERIDES: 161 mg/dL — AB (ref <150)
Total CHOL/HDL Ratio: 3.7 (calc) (ref <5.0)

## 2017-09-22 LAB — COMPLETE METABOLIC PANEL WITH GFR
AG Ratio: 2.2 (calc) (ref 1.0–2.5)
ALT: 14 U/L (ref 9–46)
AST: 21 U/L (ref 10–35)
Albumin: 4 g/dL (ref 3.6–5.1)
Alkaline phosphatase (APISO): 79 U/L (ref 40–115)
BILIRUBIN TOTAL: 1 mg/dL (ref 0.2–1.2)
BUN: 15 mg/dL (ref 7–25)
CO2: 29 mmol/L (ref 20–32)
CREATININE: 0.97 mg/dL (ref 0.70–1.25)
Calcium: 8.8 mg/dL (ref 8.6–10.3)
Chloride: 105 mmol/L (ref 98–110)
GFR, Est African American: 93 mL/min/{1.73_m2} (ref >=60)
GFR, Est Non African American: 80 mL/min/{1.73_m2} (ref >=60)
GLOBULIN: 1.8 g/dL — AB (ref 1.9–3.7)
GLUCOSE: 86 mg/dL (ref 65–99)
Potassium: 4.2 mmol/L (ref 3.5–5.3)
Sodium: 140 mmol/L (ref 135–146)
TOTAL PROTEIN: 5.8 g/dL — AB (ref 6.1–8.1)

## 2017-09-22 LAB — INSULIN, RANDOM: Insulin: 2.6 u[IU]/mL (ref 2.0–19.6)

## 2017-09-22 LAB — MAGNESIUM: Magnesium: 2 mg/dL (ref 1.5–2.5)

## 2017-09-22 LAB — VITAMIN D 25 HYDROXY (VIT D DEFICIENCY, FRACTURES): Vit D, 25-Hydroxy: 91 ng/mL (ref 30–100)

## 2017-09-26 ENCOUNTER — Other Ambulatory Visit: Payer: Self-pay | Admitting: Internal Medicine

## 2017-09-26 DIAGNOSIS — M79641 Pain in right hand: Secondary | ICD-10-CM

## 2017-10-04 ENCOUNTER — Other Ambulatory Visit: Payer: Self-pay | Admitting: Internal Medicine

## 2017-10-21 ENCOUNTER — Other Ambulatory Visit: Payer: Self-pay | Admitting: Internal Medicine

## 2018-02-27 ENCOUNTER — Encounter: Payer: Self-pay | Admitting: Internal Medicine

## 2018-02-27 DIAGNOSIS — Z8249 Family history of ischemic heart disease and other diseases of the circulatory system: Secondary | ICD-10-CM | POA: Insufficient documentation

## 2018-02-27 DIAGNOSIS — R5383 Other fatigue: Secondary | ICD-10-CM | POA: Insufficient documentation

## 2018-02-27 NOTE — Patient Instructions (Signed)

## 2018-02-27 NOTE — Progress Notes (Signed)
[facility]   Unk Pinto, M.D.     Uvaldo Bristle. Silverio Lay, P.A.-C Liane Comber, [place]                [address] [place], [place] [address] Telephone [phone] Telefax [phone] Annual  Screening/Preventative Visit  & Comprehensive Evaluation & Examination     This very nice 67 y.o. DWM presents for a Screening /Preventative Visit & comprehensive evaluation and management of multiple medical co-morbidities.  Patient has been followed for HTN, ASCAD,  HLD, Prediabetes and Vitamin D Deficiency. Patient also has COPD and uses Breo maintenance and rescues with Albuterol. He reports better results breathing with Trelegy than Breo and desires to switch.     HTN predates since 2004. Patient's BP has been controlled at home.  Today's BP is at goal at 126/58.  In 2005 , he had an acute MI and Stents. In 2007, Cardiolite was negative and in 2010 he had a negative Myoview. Patient denies any cardiac symptoms as chest pain, palpitations, shortness of breath, dizziness or ankle swelling.     Patient's hyperlipidemia is not controlled with diet as he takes Zetia daily, but he is reticent to take Statins and only takes rosuvastatin 1 x/ week. Patient denies myalgias or other medication SE's. Current lipids are not at goal & he is encouraged to take his Rosuvastatin daily:  Lab Results  Component Value Date   CHOL 210 (H) 02/28/2018   HDL 47 02/28/2018   LDLCALC 131 (H) 02/28/2018   TRIG 185 (H) 02/28/2018   CHOLHDL 4.5 02/28/2018      Patient has hx/o prediabetes (A1c 5.7%/2010)  and patient denies reactive hypoglycemic symptoms, visual blurring, diabetic polys or paresthesias. Last A1c was normal & at goal: Lab Results  Component Value Date   HGBA1C 5.5 02/28/2018       Finally, patient has history of Vitamin D Deficiency ("25/2009)  and last vitamin D was at goal: Lab Results  Component Value Date    VD25OH 73 02/28/2018   Current Outpatient Medications on File Prior to Visit  Medication Sig  . albuterol (PROVENTIL HFA;VENTOLIN HFA) 108 (90 BASE) MCG/ACT inhaler Inhale 1-2 puffs into the lungs every 6 (six) hours as needed for wheezing or shortness of breath.  Marland Kitchen aspirin EC 81 MG tablet Take 81 mg by mouth daily.  . bisoprolol-hydrochlorothiazide (ZIAC) 10-6.25 MG tablet TAKE 1 TABLET BY MOUTH ONCE DAILY FOR BLOOD PRESSURE  . cholecalciferol (VITAMIN D) 1000 UNITS tablet 1,000 Units. 4000 total a day  . Multiple Vitamin (MULTIVITAMIN) tablet Take 1 tablet by mouth daily. Takes a vitamin pack daily  . rosuvastatin (CRESTOR) 40 MG tablet Take 1 tablet every other day as directed for cholesterol.  . saw palmetto 500 MG capsule Take 500 mg by mouth daily.  Marland Kitchen ezetimibe (ZETIA) 10 MG tablet Take 1 tablet (10 mg total) by mouth daily.   No current facility-administered medications on file prior to visit.    Allergies  Allergen Reactions  . Ace Inhibitors Cough  . Ciprofloxacin   . Fenofibrate     dizzy   Past Medical History:  Diagnosis Date  . BPH (benign prostatic hyperplasia)   . COPD (chronic obstructive pulmonary disease) ([place])    via CT lung  . GERD (gastroesophageal reflux disease)   . Hyperlipidemia   . Hypertension   .  Hypogonadism male   . Myocardial infarction [facility]) 2005   Per patient no stent or PTCA, just medical treatment  . Prediabetes   . Vitamin D deficiency    Health Maintenance  Topic Date Due  . INFLUENZA VACCINE  12/22/2017  . PNA vac Low Risk Adult (2 of 2 - PPSV23) 03/01/2019  . TETANUS/TDAP  12/07/2020  . COLONOSCOPY  11/08/2022  . Hepatitis C Screening  Completed   Immunization History  Administered Date(s) Administered  . PPD Test 12/25/2013  . Pneumococcal Conjugate-13 02/28/2018  . Pneumococcal-Unspecified 12/08/2010  . Tdap 12/08/2010   Last Colon - 11/07/2012 3 yr f/u due 10/2017 & he's aware to contact his GI Dr Audie Box in  [place].  Past Surgical History:  Procedure Laterality Date  . AMPUTATION FINGER / THUMB Left 1991   distal index  . CARDIAC CATHETERIZATION N/A 2005   Per patient no stent or PTCA, just medical treatment  . colonoscopy N/A 10/2012   Neg and recc f/u Cololnoscopy in 5 yeary - Dr Nicki Reaper [place] - Gastroenterologist  . PROSTATE SURGERY     Biopsy X 3  . VASECTOMY     Family History  Problem Relation Age of Onset  . Cancer Father 23       lymphoma  . Cancer Maternal Aunt        Lung  . Cancer Maternal Uncle        Lung  . Cancer Maternal Uncle        Lung  . Stroke Maternal Grandmother   . Heart failure Maternal Grandfather   . Pneumonia Paternal Grandmother   . Liver disease Paternal Grandfather   . Stroke Son   . Other Son        internal intestinal hemorage   Social History   Socioeconomic History  . Marital status: Married    Spouse name: Divorced  . Number of children: 1 son  Occupational History  . Engineer  Tobacco Use  . Smoking status: Current Every Day Smoker    Packs/day: 1.00    Years: 36.00    Pack years: 36.00    Types: Cigarettes  . Smokeless tobacco: Never Used  Substance and Sexual Activity  . Alcohol use: Yes    Alcohol/week: 5.0 standard drinks    Types: 5 Cans of beer per week  . Drug use: No  . Sexual activity: Not on file  . Stress: Not on file  Relationships  Social History Narrative   Epworth Sleepiness score 5     ROS Constitutional: Denies fever, chills, weight loss/gain, headaches, insomnia,  night sweats or change in appetite. Does c/o fatigue. Eyes: Denies redness, blurred vision, diplopia, discharge, itchy or watery eyes.  ENT: Denies discharge, congestion, post nasal drip, epistaxis, sore throat, earache, hearing loss, dental pain, Tinnitus, Vertigo, Sinus pain or snoring.  Cardio: Denies chest pain, palpitations, irregular heartbeat, syncope, dyspnea, diaphoresis, orthopnea, PND, claudication or edema Respiratory: denies  cough, dyspnea, DOE, pleurisy, hoarseness, laryngitis or wheezing.  Gastrointestinal: Denies dysphagia, heartburn, reflux, water brash, pain, cramps, nausea, vomiting, bloating, diarrhea, constipation, hematemesis, melena, hematochezia, jaundice or hemorrhoids Genitourinary: Denies dysuria, frequency, urgency, nocturia, hesitancy, discharge, hematuria or flank pain Musculoskeletal: Denies arthralgia, myalgia, stiffness, Jt. Swelling, pain, limp or strain/sprain. Denies Falls. Skin: Denies puritis, rash, hives, warts, acne, eczema or change in skin lesion Neuro: No weakness, tremor, incoordination, spasms, paresthesia or pain Psychiatric: Denies confusion, memory loss or sensory loss. Denies Depression. Endocrine: Denies change in weight, skin, hair change, nocturia,  and paresthesia, diabetic polys, visual blurring or hyper / hypo glycemic episodes.  Heme/Lymph: No excessive bleeding, bruising or enlarged lymph nodes.  Physical Exam  BP (!) 126/58   Pulse 68   Temp 97.9 F (36.6 C)   Resp 16   Ht 6' (1.829 m)   Wt 154 lb 9.6 oz (70.1 kg)   BMI 20.97 kg/m   General Appearance: Well nourished and well groomed and in no apparent distress.  Eyes: PERRLA, EOMs, conjunctiva no swelling or erythema, normal fundi and vessels. Sinuses: No frontal/maxillary tenderness ENT/Mouth: EACs patent / TMs  nl. Nares clear without erythema, swelling, mucoid exudates. Oral hygiene is good. No erythema, swelling, or exudate. Tongue normal, non-obstructing. Tonsils not swollen or erythematous. Hearing normal.  Neck: Supple, thyroid not palpable. No bruits, nodes or JVD. Respiratory: Respiratory effort normal.  BS equal and clear bilateral without rales, rhonci, wheezing or stridor. Cardio: Heart sounds are normal with regular rate and rhythm and no murmurs, rubs or gallops. Peripheral pulses are normal and equal bilaterally without edema. No aortic or femoral bruits. Chest: symmetric with normal excursions and  percussion.  Abdomen: Soft, with Nl bowel sounds. Nontender, no guarding, rebound, hernias, masses, or organomegaly.  Lymphatics: Non tender without lymphadenopathy.  Genitourinary: DRE - deferred to colonoscopy Musculoskeletal: Full ROM all peripheral extremities, joint stability, 5/5 strength, and normal gait. Skin: Warm and dry without rashes, lesions, cyanosis, clubbing or  ecchymosis.  Neuro: Cranial nerves intact, reflexes equal bilaterally. Normal muscle tone, no cerebellar symptoms. Sensation intact.  Pysch: Alert and oriented X 3 with normal affect, insight and judgment appropriate.   Assessment and Plan  1. Annual Preventative/Screening Exam   2. Essential hypertension  - EKG 12-Lead - [place], RETROPERITNL ABD,  LTD - Urinalysis, Routine w reflex microscopic - Microalbumin / creatinine urine ratio - CBC with Differential/Platelet - COMPLETE METABOLIC PANEL WITH GFR - Magnesium - TSH  3. Hyperlipidemia, mixed  - EKG 12-Lead - [place], RETROPERITNL ABD,  LTD - Lipid panel - TSH  4. Abnormal glucose  - EKG 12-Lead - [place], RETROPERITNL ABD,  LTD - Hemoglobin A1c - Insulin, random  5. Vitamin D deficiency  - VITAMIN D 25 Hydroxyl  6. Prediabetes  - EKG 12-Lead - [place], RETROPERITNL ABD,  LTD - Hemoglobin A1c - Insulin, random  7. ASHD s/p Stent  - EKG 12-Lead - Lipid panel  8. Chronic obstructive pulmonary disease(HCC)  - replace Breo with  - Fluticasone-Umeclidin-Vilant (TRELEGY ELLIPTA) 100-62.5-25 MCG/INH AEPB; Inhale 1 puff into the lungs daily.  Dispense: 60 each; Refill: 12  - predniSONE (DELTASONE) 20 MG tablet; 1 tab 3 x day for 3 days, then 1 tab 2 x day for 3 days, then 1 tab 1 x day for 5 days  Dispense: 20 tablet; Refill: 2  9. Screening for colorectal cancer  - POC Hemoccult Bld/Stl  10. BPH with obstruction/lower urinary tract symptoms  - PSA  11. Screening for ischemic heart disease  - EKG 12-Lead - Lipid panel  12. FHx: heart  disease  - EKG 12-Lead - [place], RETROPERITNL ABD,  LTD  13. Smoker  - EKG 12-Lead - [place], RETROPERITNL ABD,  LTD  14. Aortic atherosclerosis ([place])   15. Screening for AAA (aortic abdominal aneurysm)  - [place], RETROPERITNL ABD,  LTD  16. Encounter for screening for malignant neoplasm of respiratory organs   17. Fatigue  - Iron,Total/Total Iron Binding Cap - Vitamin B12 - Testosterone  18. Medication management  - Urinalysis, Routine w  reflex microscopic - Microalbumin / creatinine urine ratio - CBC with Differential/Platelet - COMPLETE METABOLIC PANEL WITH GFR - Magnesium - Lipid panel - TSH - Hemoglobin A1c - Insulin, random - VITAMIN D 25 Hydroxyl  19. Need for prophylactic vaccination against Streptococcus pneumoniae (pneumococcus)  - Pneumococcal conjugate vaccine 13-valent          Patient was counseled in prudent diet, weight control to achieve/maintain BMI less than 25, BP monitoring, regular exercise and medications as discussed.  Discussed med effects and SE's. Routine screening labs and tests as requested with regular follow-up as recommended. Over 40 minutes of exam, counseling, chart review and high complex critical decision making was performed

## 2018-02-28 ENCOUNTER — Ambulatory Visit (INDEPENDENT_AMBULATORY_CARE_PROVIDER_SITE_OTHER): Payer: 59 | Admitting: Internal Medicine

## 2018-02-28 VITALS — BP 126/58 | HR 68 | Temp 97.9°F | Resp 16 | Ht 72.0 in | Wt 154.6 lb

## 2018-02-28 DIAGNOSIS — I10 Essential (primary) hypertension: Secondary | ICD-10-CM | POA: Diagnosis not present

## 2018-02-28 DIAGNOSIS — N138 Other obstructive and reflux uropathy: Secondary | ICD-10-CM

## 2018-02-28 DIAGNOSIS — R5383 Other fatigue: Secondary | ICD-10-CM

## 2018-02-28 DIAGNOSIS — F17200 Nicotine dependence, unspecified, uncomplicated: Secondary | ICD-10-CM

## 2018-02-28 DIAGNOSIS — Z136 Encounter for screening for cardiovascular disorders: Secondary | ICD-10-CM | POA: Diagnosis not present

## 2018-02-28 DIAGNOSIS — E559 Vitamin D deficiency, unspecified: Secondary | ICD-10-CM

## 2018-02-28 DIAGNOSIS — I700 Atherosclerosis of aorta: Secondary | ICD-10-CM

## 2018-02-28 DIAGNOSIS — N401 Enlarged prostate with lower urinary tract symptoms: Secondary | ICD-10-CM

## 2018-02-28 DIAGNOSIS — Z0001 Encounter for general adult medical examination with abnormal findings: Secondary | ICD-10-CM

## 2018-02-28 DIAGNOSIS — E782 Mixed hyperlipidemia: Secondary | ICD-10-CM

## 2018-02-28 DIAGNOSIS — Z1211 Encounter for screening for malignant neoplasm of colon: Secondary | ICD-10-CM

## 2018-02-28 DIAGNOSIS — Z79899 Other long term (current) drug therapy: Secondary | ICD-10-CM

## 2018-02-28 DIAGNOSIS — Z0000 Encounter for general adult medical examination without abnormal findings: Secondary | ICD-10-CM

## 2018-02-28 DIAGNOSIS — I2510 Atherosclerotic heart disease of native coronary artery without angina pectoris: Secondary | ICD-10-CM

## 2018-02-28 DIAGNOSIS — R7303 Prediabetes: Secondary | ICD-10-CM

## 2018-02-28 DIAGNOSIS — Z23 Encounter for immunization: Secondary | ICD-10-CM

## 2018-02-28 DIAGNOSIS — Z122 Encounter for screening for malignant neoplasm of respiratory organs: Secondary | ICD-10-CM

## 2018-02-28 DIAGNOSIS — J449 Chronic obstructive pulmonary disease, unspecified: Secondary | ICD-10-CM

## 2018-02-28 DIAGNOSIS — Z8249 Family history of ischemic heart disease and other diseases of the circulatory system: Secondary | ICD-10-CM

## 2018-02-28 DIAGNOSIS — Z1212 Encounter for screening for malignant neoplasm of rectum: Secondary | ICD-10-CM

## 2018-02-28 DIAGNOSIS — R7309 Other abnormal glucose: Secondary | ICD-10-CM

## 2018-02-28 MED ORDER — FLUTICASONE-UMECLIDIN-VILANT 100-62.5-25 MCG/INH IN AEPB: 1.0000 | INHALATION_SPRAY | Freq: Every day | RESPIRATORY_TRACT | 12 refills | Status: DC

## 2018-02-28 MED ORDER — PREDNISONE 20 MG PO TABS: ORAL_TABLET | ORAL | 2 refills | Status: DC

## 2018-03-01 LAB — CBC WITH DIFFERENTIAL/PLATELET
BASOS PCT: 1.2 %
Basophils Absolute: 98 cells/uL (ref 0–200)
EOS ABS: 107 {cells}/uL (ref 15–500)
EOS PCT: 1.3 %
HCT: 40.1 % (ref 38.5–50.0)
Hemoglobin: 13.5 g/dL (ref 13.2–17.1)
Lymphs Abs: 1771 cells/uL (ref 850–3900)
MCH: 31.6 pg (ref 27.0–33.0)
MCHC: 33.7 g/dL (ref 32.0–36.0)
MCV: 93.9 fL (ref 80.0–100.0)
MONOS PCT: 11.4 %
MPV: 10.2 fL (ref 7.5–12.5)
NEUTROS ABS: 5289 {cells}/uL (ref 1500–7800)
Neutrophils Relative %: 64.5 %
Platelets: 134 10*3/uL — ABNORMAL LOW (ref 140–400)
RBC: 4.27 10*6/uL (ref 4.20–5.80)
RDW: 12.4 % (ref 11.0–15.0)
Total Lymphocyte: 21.6 %
WBC mixed population: 935 cells/uL (ref 200–950)
WBC: 8.2 10*3/uL (ref 3.8–10.8)

## 2018-03-01 LAB — COMPLETE METABOLIC PANEL WITH GFR
AG Ratio: 2.2 (calc) (ref 1.0–2.5)
ALT: 18 U/L (ref 9–46)
AST: 24 U/L (ref 10–35)
Albumin: 4.1 g/dL (ref 3.6–5.1)
Alkaline phosphatase (APISO): 84 U/L (ref 40–115)
BILIRUBIN TOTAL: 0.8 mg/dL (ref 0.2–1.2)
BUN: 19 mg/dL (ref 7–25)
CHLORIDE: 103 mmol/L (ref 98–110)
CO2: 32 mmol/L (ref 20–32)
Calcium: 9.2 mg/dL (ref 8.6–10.3)
Creat: 0.85 mg/dL (ref 0.70–1.25)
GFR, Est African American: 104 mL/min/{1.73_m2} (ref >=60)
GFR, Est Non African American: 90 mL/min/{1.73_m2} (ref >=60)
GLUCOSE: 83 mg/dL (ref 65–99)
Globulin: 1.9 g/dL (calc) (ref 1.9–3.7)
Potassium: 4.5 mmol/L (ref 3.5–5.3)
Sodium: 139 mmol/L (ref 135–146)
TOTAL PROTEIN: 6 g/dL — AB (ref 6.1–8.1)

## 2018-03-01 LAB — PSA: PSA: 6.9 ng/mL — ABNORMAL HIGH (ref <=4.0)

## 2018-03-01 LAB — INSULIN, RANDOM: INSULIN: 3.1 u[IU]/mL (ref 2.0–19.6)

## 2018-03-01 LAB — LIPID PANEL
CHOLESTEROL: 210 mg/dL — AB (ref <200)
HDL: 47 mg/dL (ref >40)
LDL Cholesterol (Calc): 131 mg/dL (calc) — ABNORMAL HIGH
Non-HDL Cholesterol (Calc): 163 mg/dL (calc) — ABNORMAL HIGH (ref <130)
TRIGLYCERIDES: 185 mg/dL — AB (ref <150)
Total CHOL/HDL Ratio: 4.5 (calc) (ref <5.0)

## 2018-03-01 LAB — HEMOGLOBIN A1C
Hgb A1c MFr Bld: 5.5 % of total Hgb (ref <5.7)
Mean Plasma Glucose: 111 (calc)
eAG (mmol/L): 6.2 (calc)

## 2018-03-01 LAB — IRON, TOTAL/TOTAL IRON BINDING CAP
%SAT: 27 % (ref 20–48)
IRON: 95 ug/dL (ref 50–180)
TIBC: 348 ug/dL (ref 250–425)

## 2018-03-01 LAB — TESTOSTERONE: Testosterone: 157 ng/dL — ABNORMAL LOW (ref 250–827)

## 2018-03-01 LAB — URINALYSIS, ROUTINE W REFLEX MICROSCOPIC
BILIRUBIN URINE: NEGATIVE
Glucose, UA: NEGATIVE
HGB URINE DIPSTICK: NEGATIVE
KETONES UR: NEGATIVE
Leukocytes, UA: NEGATIVE
NITRITE: NEGATIVE
PROTEIN: NEGATIVE
SPECIFIC GRAVITY, URINE: 1.02 (ref 1.001–1.03)
pH: 6.5 (ref 5.0–8.0)

## 2018-03-01 LAB — VITAMIN D 25 HYDROXY (VIT D DEFICIENCY, FRACTURES): VIT D 25 HYDROXY: 73 ng/mL (ref 30–100)

## 2018-03-01 LAB — MICROALBUMIN / CREATININE URINE RATIO: CREATININE, URINE: 93 mg/dL (ref 20–320)

## 2018-03-01 LAB — MAGNESIUM: MAGNESIUM: 2 mg/dL (ref 1.5–2.5)

## 2018-03-01 LAB — VITAMIN B12: VITAMIN B 12: 557 pg/mL (ref 200–1100)

## 2018-03-01 LAB — TSH: TSH: 1.23 mIU/L (ref 0.40–4.50)

## 2018-06-12 DIAGNOSIS — R918 Other nonspecific abnormal finding of lung field: Secondary | ICD-10-CM | POA: Insufficient documentation

## 2018-06-12 NOTE — Progress Notes (Deleted)
FOLLOW UP  Assessment and Plan:   Essential hypertension At goal; continue medication Monitor blood pressure at home; call if consistently over 130/80 Continue DASH diet.   Reminder to go to the ER if any CP, SOB, nausea, dizziness, severe HA, changes vision/speech, left arm numbness and tingling and jaw pain.  ASHD s/p Stent Continue follow up with [name] Strongly recommended he reduce or stop smoking Control blood pressure, cholesterol, glucose, increase exercise.   COPD Advised to stop smoking, continue medications - improved with trellegy   Benign prostatic hyperplasia with lower urinary tract symptoms, symptom details unspecified Previously established with [name]; monitored by this office; will refer back as indicated Currently reports urinary symptoms are well controlled; he takes saw palmetto for symptoms and declines further medications  Vitamin D deficiency Continue supplementation; at goal  Check vitamin D level biannually or as needed  Mixed hyperlipidemia Continue follow up with [name]; goal LDL <70 per cardiology Continue medications: zetia 10 mg daily, rosuvastatin 40 mg has not been taking with questionable muscle cramps in calves that are intermittently ongoing - did not improve with cessation of statin. He will try a lower dose - 20 mg once weekly and evaluate cramping - *** Continue low cholesterol diet and exercise.  -     Lipid panel -     TSH  Medication management -     CBC with Differential/Platelet -     CMP/GFR  Tobacco use disorder 36 pack year hx; Discussed risks associated with tobacco use and advised to reduce or quit Patient is not ready to do so, but advised to consider strongly Will follow up at the next visit  Thrombocytopenia ([place]) -     CBC with Differential/Platelet  Continue diet and meds as discussed. Further disposition pending results of labs. Discussed med's effects and SE's.   Over 30 minutes of exam, counseling,  chart review, and critical decision making was performed.   Future Appointments  Date Time Provider [place]  06/13/2018  9:30 AM Liane Comber, Michael [name] [facility] None  09/12/2018  9:30 AM Unk Pinto, MD [facility] None  03/13/2019  9:00 AM Unk Pinto, MD [facility] None    ----------------------------------------------------------------------------------------------------------------------  HPI 68 y.o. male  presents for 3 month follow up on hypertension, cholesterol, glucose management, weight and vitamin D deficiency. 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 He is recently established with 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  He is previously established with [name] for BPH/elevated PSAs which have been stable- negative biopsy x3 in the past.   he currently continues to smoke 1 pack a day; discussed risks associated with smoking, patient {ACTION; IS/IS JOI:[id number]} ready to quit. He had low dose lung CT screening on 07/19/2017 which showed multiple benign appearing pulmonary nodules and was recommended continued annual low dose CT screening. He has COPD and recently transitioned from [facility] to trellegy ellipta with perceived benefit. He has albuterol ***   BMI is There is no height or weight on file to calculate BMI., he {HAS HAS NOB:09628} been working on diet and exercise. Wt Readings from Last 3 Encounters:  02/28/18 154 lb 9.6 oz (70.1 kg)  09/21/17 153 lb (69.4 kg)  05/20/17 158 lb (71.7 kg)    His blood pressure {HAS HAS NOT:18834} been controlled at home, today their BP is  He {DOES_DOES POE:42353} workout. He denies chest pain, shortness of breath, dizziness.   He is on cholesterol medication Rosuvastatin *** and Zetia *** and denies myalgias. His cholesterol is not at goal. The cholesterol last visit was:   Lab Results   Component Value Date   CHOL 210 (H) 02/28/2018   HDL 47 02/28/2018   LDLCALC 131 (H) 02/28/2018   TRIG 185 (H) 02/28/2018   CHOLHDL 4.5 02/28/2018    He {Has/has not:18111} been working on diet and exercise for glucose management, and denies {Symptoms; diabetes w/o none:19199}. Last A1C in the office was:  Lab Results  Component Value Date   HGBA1C 5.5 02/28/2018   Patient is on Vitamin D supplement.   Lab Results  Component Value Date   VD25OH 73 02/28/2018        Current Medications:  Current Outpatient Medications on File Prior to Visit  Medication Sig  . albuterol (PROVENTIL HFA;VENTOLIN HFA) 108 (90 BASE) MCG/ACT inhaler Inhale 1-2 puffs into the lungs every 6 (six) hours as needed for wheezing or shortness of breath.  Marland Kitchen aspirin EC 81 MG tablet Take 81 mg by mouth daily.  . bisoprolol-hydrochlorothiazide (ZIAC) 10-6.25 MG tablet TAKE 1 TABLET BY MOUTH ONCE DAILY FOR BLOOD PRESSURE  . cholecalciferol (VITAMIN D) 1000 UNITS tablet 1,000 Units. 4000 total a day  . ezetimibe (ZETIA) 10 MG tablet Take 1 tablet (10 mg total) by mouth daily.  . Fluticasone-Umeclidin-Vilant (TRELEGY ELLIPTA) 100-62.5-25 MCG/INH AEPB Inhale 1 puff into the lungs daily.  . Multiple Vitamin (MULTIVITAMIN) tablet Take 1 tablet by mouth daily. Takes a vitamin pack daily  . predniSONE (DELTASONE) 20 MG tablet 1 tab 3 x day for 3 days, then 1 tab 2 x day for 3 days, then 1 tab 1 x day for 5 days  . rosuvastatin (CRESTOR) 40 MG tablet Take 1 tablet every other day as directed for cholesterol.  . saw palmetto 500 MG capsule Take 500 mg by mouth daily.   No current facility-administered medications on file prior to visit.      Allergies:  Allergies  Allergen Reactions  . Ace Inhibitors Cough  . Ciprofloxacin   . Fenofibrate     dizzy     Medical History:  Past Medical History:  Diagnosis Date  . BPH (benign prostatic hyperplasia)   . COPD (chronic obstructive pulmonary disease) ([place])    via  CT lung  . GERD (gastroesophageal reflux disease)   . Hyperlipidemia   . Hypertension   . Hypogonadism male   . Myocardial infarction [facility]) 2005   Per patient no stent or PTCA, just medical treatment  . Prediabetes   . Vitamin D deficiency    Family history- Reviewed and unchanged Social history- Reviewed and unchanged   Review of Systems:  Review of Systems  Constitutional: Negative for malaise/fatigue and weight loss.  HENT: Negative for hearing loss and tinnitus.   Eyes: Negative for blurred vision and double vision.  Respiratory: Negative for cough, shortness of breath and wheezing.   Cardiovascular: Negative for chest pain, palpitations, orthopnea, claudication and leg swelling.  Gastrointestinal: Negative for abdominal pain, blood in stool, constipation, diarrhea, heartburn, melena, nausea and vomiting.  Genitourinary: Negative.   Musculoskeletal: Negative for joint pain and myalgias.  Skin: Negative for rash.  Neurological: Negative for dizziness, tingling, sensory change, weakness and headaches.  Endo/Heme/Allergies: Negative for polydipsia.  Psychiatric/Behavioral: Negative.   All other systems reviewed and are negative.     Physical Exam: There were  no vitals taken for this visit. Wt Readings from Last 3 Encounters:  02/28/18 154 lb 9.6 oz (70.1 kg)  09/21/17 153 lb (69.4 kg)  05/20/17 158 lb (71.7 kg)   General Appearance: Well nourished, in no apparent distress. Eyes: PERRLA, EOMs, conjunctiva no swelling or erythema Sinuses: No Frontal/maxillary tenderness ENT/Mouth: Ext aud canals clear, TMs without erythema, bulging. No erythema, swelling, or exudate on post pharynx.  Tonsils not swollen or erythematous. Hearing normal.  Neck: Supple, thyroid normal.  Respiratory: Respiratory effort normal, BS equal bilaterally without rales, rhonchi, wheezing or stridor.  Cardio: RRR with no MRGs. Brisk peripheral pulses without edema.  Abdomen: Soft, + BS.  Non tender,  no guarding, rebound, hernias, masses. Lymphatics: Non tender without lymphadenopathy.  Musculoskeletal: Full ROM, 5/5 strength, {PSY - GAIT AND STATION:22860} gait Skin: Warm, dry without rashes, lesions, ecchymosis.  Neuro: Cranial nerves intact. No cerebellar symptoms.  Psych: Awake and oriented X 3, normal affect, Insight and Judgment appropriate.    Michael Ribas, Michael [name] 8:37 AM [facility]

## 2018-06-13 ENCOUNTER — Ambulatory Visit: Payer: Self-pay | Admitting: Adult Health

## 2018-06-19 NOTE — Progress Notes (Signed)
FOLLOW UP  Assessment and Plan:   Essential hypertension At goal; continue medication Monitor blood pressure at home; call if consistently over 130/80 Continue DASH diet.   Reminder to go to the ER if any CP, SOB, nausea, dizziness, severe HA, changes vision/speech, left arm numbness and tingling and jaw pain.  ASHD s/p Stent Continue follow up with [name] Strongly recommended he reduce or stop smoking Control blood pressure, cholesterol, glucose, increase exercise.   COPD Advised to stop smoking, continue medications - improved with trellegy  Information given for financial assistance with trellegy   Vitamin D deficiency Continue supplementation; at goal  Check vitamin D level biannually or as needed  Mixed hyperlipidemia Continue follow up with [name]; goal LDL <70 per cardiology Continue medications:  rosuvastatin 40 mg twice weekly  Continue low cholesterol diet and exercise.  -     Lipid panel -     TSH  Medication management -     CBC with Differential/Platelet -     CMP/GFR  Tobacco use disorder 36 pack year hx; Discussed risks associated with tobacco use and advised to reduce or quit Patient is not ready to do so, but advised to consider strongly Will follow up at the next visit   Pulmonary nodules Follow up low dose CT ordered after discussion with patient  Recommended he quit smoking  Thrombocytopenia ([place]) -     CBC with Differential/Platelet  Continue diet and meds as discussed. Further disposition pending results of labs. Discussed med's effects and SE's.   Over 30 minutes of exam, counseling, chart review, and critical decision making was performed.   Future Appointments  Date Time Provider [place]  09/12/2018  9:30 AM Unk Pinto, MD [facility] None  03/13/2019  9:00 AM Unk Pinto, MD [facility] None     ----------------------------------------------------------------------------------------------------------------------  HPI 68 y.o. male  presents for 3 month follow up on hypertension, cholesterol, glucose management, weight and vitamin D deficiency. 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 He is recently established with 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  He is previously established with [name] for BPH/elevated PSAs which have been stable- negative biopsy x3 in the past.   he currently continues to smoke 1 pack a day; discussed risks associated with smoking, patient is not ready to quit. He had low dose lung CT screening on 07/19/2017 which showed multiple benign appearing pulmonary nodules and was recommended continued annual low dose CT screening which will be due 06/2018. He has COPD and recently transitioned from [facility] to trellegy ellipta with perceived benefit.   BMI is Body mass index is 22.05 kg/m., he has been working on diet and exercise. Wt Readings from Last 3 Encounters:  06/21/18 162 lb 9.6 oz (73.8 kg)  02/28/18 154 lb 9.6 oz (70.1 kg)  09/21/17 153 lb (69.4 kg)   His blood pressure has been controlled at home, today their BP is BP: 110/68  He does workout. He denies chest pain, shortness of breath, dizziness.   He is on cholesterol medication Rosuvastatin 40 mg twice weekly and denies myalgias. His cholesterol is not at goal. The cholesterol last visit was:   Lab Results  Component Value Date   CHOL 210 (H) 02/28/2018   HDL 47 02/28/2018   LDLCALC 131 (H) 02/28/2018   TRIG 185 (H) 02/28/2018  CHOLHDL 4.5 02/28/2018    He has been working on diet and exercise for glucose management, and denies increased appetite, nausea, paresthesia of the feet, polydipsia, polyuria, visual disturbances and vomiting. Last A1C in the office was:  Lab  Results  Component Value Date   HGBA1C 5.5 02/28/2018   Patient is on Vitamin D supplement.   Lab Results  Component Value Date   VD25OH 73 02/28/2018        Current Medications:  Current Outpatient Medications on File Prior to Visit  Medication Sig  . albuterol (PROVENTIL HFA;VENTOLIN HFA) 108 (90 BASE) MCG/ACT inhaler Inhale 1-2 puffs into the lungs every 6 (six) hours as needed for wheezing or shortness of breath.  Marland Kitchen aspirin EC 81 MG tablet Take 81 mg by mouth daily.  . bisoprolol-hydrochlorothiazide (ZIAC) 10-6.25 MG tablet TAKE 1 TABLET BY MOUTH ONCE DAILY FOR BLOOD PRESSURE  . cholecalciferol (VITAMIN D) 1000 UNITS tablet 1,000 Units. 4000 total a day  . Fluticasone-Umeclidin-Vilant (TRELEGY ELLIPTA) 100-62.5-25 MCG/INH AEPB Inhale 1 puff into the lungs daily.  . Multiple Vitamin (MULTIVITAMIN) tablet Take 1 tablet by mouth daily. Takes a vitamin pack daily  . rosuvastatin (CRESTOR) 40 MG tablet Take 1 tablet every other day as directed for cholesterol. (Patient taking differently: Takes 2 times a week)  . saw palmetto 500 MG capsule Take 500 mg by mouth daily.   No current facility-administered medications on file prior to visit.      Allergies:  Allergies  Allergen Reactions  . Ace Inhibitors Cough  . Ciprofloxacin   . Fenofibrate     dizzy     Medical History:  Past Medical History:  Diagnosis Date  . BPH (benign prostatic hyperplasia)   . COPD (chronic obstructive pulmonary disease) ([place])    via CT lung  . GERD (gastroesophageal reflux disease)   . Hyperlipidemia   . Hypertension   . Hypogonadism male   . Myocardial infarction [facility] 2005   Per patient no stent or PTCA, just medical treatment  . Prediabetes   . Vitamin D deficiency    Family history- Reviewed and unchanged Social history- Reviewed and unchanged   Review of Systems:  Review of Systems  Constitutional: Negative for malaise/fatigue and weight loss.  HENT: Negative for hearing loss and  tinnitus.   Eyes: Negative for blurred vision and double vision.  Respiratory: Negative for cough, shortness of breath and wheezing.   Cardiovascular: Negative for chest pain, palpitations, orthopnea, claudication and leg swelling.  Gastrointestinal: Negative for abdominal pain, blood in stool, constipation, diarrhea, heartburn, melena, nausea and vomiting.  Genitourinary: Negative.   Musculoskeletal: Negative for joint pain and myalgias.  Skin: Negative for rash.  Neurological: Negative for dizziness, tingling, sensory change, weakness and headaches.  Endo/Heme/Allergies: Negative for polydipsia.  Psychiatric/Behavioral: Negative.   All other systems reviewed and are negative.     Physical Exam: BP 110/68   Pulse (!) 57   Temp (!) 97.5 F (36.4 C)   Ht 6' (1.829 m)   Wt 162 lb 9.6 oz (73.8 kg)   SpO2 97%   BMI 22.05 kg/m  Wt Readings from Last 3 Encounters:  06/21/18 162 lb 9.6 oz (73.8 kg)  02/28/18 154 lb 9.6 oz (70.1 kg)  09/21/17 153 lb (69.4 kg)   General Appearance: Well nourished, in no apparent distress. Eyes: PERRLA, EOMs, conjunctiva no swelling or erythema Sinuses: No Frontal/maxillary tenderness ENT/Mouth: Ext aud canals clear, TMs without erythema, bulging. No erythema, swelling, or exudate on post  pharynx.  Tonsils not swollen or erythematous. Hearing normal.  Neck: Supple, thyroid normal.  Respiratory: Respiratory effort normal, BS equal bilaterally without rales, rhonchi, wheezing or stridor.  Cardio: RRR with no MRGs. Brisk peripheral pulses without edema.  Abdomen: Soft, + BS.  Non tender, no guarding, rebound, hernias, masses. Lymphatics: Non tender without lymphadenopathy.  Musculoskeletal: Full ROM, 5/5 strength, Normal gait. 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  Skin: Warm, dry without rashes, lesions, ecchymosis.  Neuro: Cranial nerves intact. No cerebellar symptoms.  Psych: Awake and oriented X 3, normal affect,  Insight and Judgment appropriate.    Izora Ribas, NP 10:05 AM [facility]

## 2018-06-21 ENCOUNTER — Ambulatory Visit (INDEPENDENT_AMBULATORY_CARE_PROVIDER_SITE_OTHER): Payer: Medicare Other | Admitting: Adult Health

## 2018-06-21 ENCOUNTER — Encounter: Payer: Self-pay | Admitting: Adult Health

## 2018-06-21 VITALS — BP 110/68 | HR 57 | Temp 97.5°F | Ht 72.0 in | Wt 162.6 lb

## 2018-06-21 DIAGNOSIS — Z122 Encounter for screening for malignant neoplasm of respiratory organs: Secondary | ICD-10-CM

## 2018-06-21 DIAGNOSIS — I2510 Atherosclerotic heart disease of native coronary artery without angina pectoris: Secondary | ICD-10-CM

## 2018-06-21 DIAGNOSIS — F17200 Nicotine dependence, unspecified, uncomplicated: Secondary | ICD-10-CM

## 2018-06-21 DIAGNOSIS — D696 Thrombocytopenia, unspecified: Secondary | ICD-10-CM | POA: Diagnosis not present

## 2018-06-21 DIAGNOSIS — R918 Other nonspecific abnormal finding of lung field: Secondary | ICD-10-CM

## 2018-06-21 DIAGNOSIS — J449 Chronic obstructive pulmonary disease, unspecified: Secondary | ICD-10-CM | POA: Diagnosis not present

## 2018-06-21 DIAGNOSIS — Z720 Tobacco use: Secondary | ICD-10-CM

## 2018-06-21 DIAGNOSIS — R7309 Other abnormal glucose: Secondary | ICD-10-CM

## 2018-06-21 DIAGNOSIS — E782 Mixed hyperlipidemia: Secondary | ICD-10-CM

## 2018-06-21 DIAGNOSIS — I10 Essential (primary) hypertension: Secondary | ICD-10-CM

## 2018-06-21 DIAGNOSIS — E559 Vitamin D deficiency, unspecified: Secondary | ICD-10-CM | POA: Diagnosis not present

## 2018-06-21 NOTE — Patient Instructions (Signed)
Look up   [email or web address]  For help with assistance covering trellegy    Know what a healthy weight is for you (roughly BMI <25) and aim to maintain this  Aim for 7+ servings of fruits and vegetables daily  65-80+ fluid ounces of water or unsweet tea for healthy kidneys  Limit to max 1 drink of alcohol per day; avoid smoking/tobacco  Limit animal fats in diet for cholesterol and heart health - choose grass fed whenever available  Avoid highly processed foods, and foods high in saturated/trans fats  Aim for low stress - take time to unwind and care for your mental health  Aim for 150 min of moderate intensity exercise weekly for heart health, and weights twice weekly for bone health  Aim for 7-9 hours of sleep daily     Pulmonary Nodule A pulmonary nodule is a small, round growth of tissue in the lung. It is sometimes referred to as a "shadow" or "spot on the lung." Nodules range in size from less than 1/5 of an inch (4 mm) to a little bigger than an inch (30 mm). Pulmonary nodules can be either noncancerous (benign) or cancerous (malignant). Most are noncancerous. Smaller nodules in people who do not smoke and do not have any other risk factors for lung cancer are more likely to be noncancerous. Larger, irregular nodules in people who smoke or who have a strong family history of lung cancer are more likely to be cancerous. What are the causes? This condition may be caused by:  A bacterial, fungal, or viral infection, such as tuberculosis. The infection is usually an old and inactive one.  A noncancerous mass of tissue.  Inflammation from conditions such as rheumatoid arthritis.  Abnormal blood vessels in the lungs.  Cancerous tissue, such as lung cancer or a cancer in another part of the body that has spread to the lung. What are the signs or symptoms? This condition usually does not cause symptoms. If symptoms appear, they are usually related to the underlying cause. For  example, if the condition is caused by an infection, you may have a cough or fever. How is this diagnosed? This condition is usually diagnosed with an X-ray or CT scan. To help determine whether a pulmonary nodule is benign or malignant, your health care provider will:  Take your medical history.  Perform a physical exam.  Order tests, including: ? Blood tests. ? A skin test called a tuberculin test. This test is done to check if you have been exposed to the germ that causes tuberculosis. ? Chest X-rays. ? A CT scan. This test shows smaller pulmonary nodules more clearly and with more detail than an X-ray. ? A positron emission tomography (PET) scan. This test is done to check if the nodule is cancerous. During the test, a safe amount of a radioactive substance is injected into the bloodstream. Then a picture is taken. ? Biopsy. In this test, a tiny piece of the pulmonary nodule is removed and then examined under a microscope. How is this treated? Treatment for this condition depends on whether the pulmonary nodule is malignant or benign as well as your risk of getting cancer.  Noncancerous nodules usually do not need to be treated, but they may need to be monitored with CT scans. If a CT scan shows that the pulmonary nodule got bigger, more tests may be done.  Some nodules need to be removed. If this is the case, you may have a procedure called a  thoractomy. During the procedure, your health care provider will make an incision in your chest and remove the part of the lung where the nodule is located. Follow these instructions at home:   Take over-the-counter and prescription medicines only as told by your health care provider.  Do not use any products that contain nicotine or tobacco, such as cigarettes and e-cigarettes. If you need help quitting, ask your health care provider.  Keep all follow-up visits as told by your health care provider. This is important. Contact a health care  provider if:  You have trouble breathing when you are active.  You feel sick or unusually tired.  You do not feel like eating.  You lose weight without trying.  You develop chills or night sweats. Get help right away if:  You cannot catch your breath.  You begin wheezing.  You cannot stop coughing.  You cough up blood.  You become dizzy or feel like you are going to faint.  You have sudden chest pain.  You have a fever or persistent symptoms for more than 2-3 days.  You have a fever and your symptoms suddenly get worse. Summary  A pulmonary nodule is a small, round growth of tissue in the lung. Most pulmonary nodules are noncancerous.  This condition is usually diagnosed with an X-ray or CT scan.  Common causes of pulmonary nodules include infection, inflammation, and noncancerous growths.  Though less common, if a nodule is found to be cancerous, you will need specific diagnostic tests and treatment options as directed by your medical provider.  Treatment for this condition depends on whether the pulmonary nodule is benign or malignant as well as your risk of getting cancer. This information is not intended to replace advice given to you by your health care provider. Make sure you discuss any questions you have with your health care provider. Document Released: 03/07/2009 Document Revised: 06/08/2016 Document Reviewed: 06/08/2016 Elsevier Interactive Patient Education  2019 [employer].

## 2018-06-22 LAB — COMPLETE METABOLIC PANEL WITH GFR
AG Ratio: 2 (calc) (ref 1.0–2.5)
ALT: 16 U/L (ref 9–46)
AST: 21 U/L (ref 10–35)
Albumin: 4.1 g/dL (ref 3.6–5.1)
Alkaline phosphatase (APISO): 95 U/L (ref 40–115)
BUN: 14 mg/dL (ref 7–25)
CO2: 29 mmol/L (ref 20–32)
Calcium: 9 mg/dL (ref 8.6–10.3)
Chloride: 105 mmol/L (ref 98–110)
Creat: 0.91 mg/dL (ref 0.70–1.25)
GFR, EST AFRICAN AMERICAN: 100 mL/min/{1.73_m2} (ref >=60)
GFR, Est Non African American: 86 mL/min/{1.73_m2} (ref >=60)
Globulin: 2.1 g/dL (calc) (ref 1.9–3.7)
Glucose, Bld: 84 mg/dL (ref 65–99)
Potassium: 4.9 mmol/L (ref 3.5–5.3)
Sodium: 140 mmol/L (ref 135–146)
TOTAL PROTEIN: 6.2 g/dL (ref 6.1–8.1)
Total Bilirubin: 1.1 mg/dL (ref 0.2–1.2)

## 2018-06-22 LAB — CBC WITH DIFFERENTIAL/PLATELET
Absolute Monocytes: 801 cells/uL (ref 200–950)
Basophils Absolute: 99 cells/uL (ref 0–200)
Basophils Relative: 1.1 %
Eosinophils Absolute: 117 cells/uL (ref 15–500)
Eosinophils Relative: 1.3 %
HCT: 41.5 % (ref 38.5–50.0)
Hemoglobin: 14.2 g/dL (ref 13.2–17.1)
Lymphs Abs: 2196 cells/uL (ref 850–3900)
MCH: 32.1 pg (ref 27.0–33.0)
MCHC: 34.2 g/dL (ref 32.0–36.0)
MCV: 93.7 fL (ref 80.0–100.0)
MPV: 10 fL (ref 7.5–12.5)
Monocytes Relative: 8.9 %
Neutro Abs: 5787 cells/uL (ref 1500–7800)
Neutrophils Relative %: 64.3 %
Platelets: 147 10*3/uL (ref 140–400)
RBC: 4.43 10*6/uL (ref 4.20–5.80)
RDW: 12.8 % (ref 11.0–15.0)
Total Lymphocyte: 24.4 %
WBC: 9 10*3/uL (ref 3.8–10.8)

## 2018-06-22 LAB — LIPID PANEL
Cholesterol: 201 mg/dL — ABNORMAL HIGH (ref <200)
HDL: 49 mg/dL (ref >40)
LDL Cholesterol (Calc): 122 mg/dL (calc) — ABNORMAL HIGH
Non-HDL Cholesterol (Calc): 152 mg/dL (calc) — ABNORMAL HIGH (ref <130)
Total CHOL/HDL Ratio: 4.1 (calc) (ref <5.0)
Triglycerides: 188 mg/dL — ABNORMAL HIGH (ref <150)

## 2018-06-22 LAB — TSH: TSH: 1.51 mIU/L (ref 0.40–4.50)

## 2018-06-22 LAB — MAGNESIUM: Magnesium: 2.1 mg/dL (ref 1.5–2.5)

## 2018-06-23 ENCOUNTER — Other Ambulatory Visit: Payer: Self-pay | Admitting: Adult Health

## 2018-06-23 DIAGNOSIS — Z122 Encounter for screening for malignant neoplasm of respiratory organs: Secondary | ICD-10-CM

## 2018-07-12 ENCOUNTER — Ambulatory Visit: Payer: Self-pay

## 2018-07-21 ENCOUNTER — Ambulatory Visit: Payer: Self-pay

## 2018-07-27 ENCOUNTER — Ambulatory Visit
Admission: RE | Admit: 2018-07-27 | Discharge: 2018-07-27 | Disposition: A | Discharge status: Home / Self Care | Payer: Medicare Other | Source: Ambulatory Visit | Attending: Adult Health | Admitting: Adult Health

## 2018-07-27 ENCOUNTER — Other Ambulatory Visit: Payer: Self-pay | Admitting: Adult Health

## 2018-07-27 DIAGNOSIS — Z122 Encounter for screening for malignant neoplasm of respiratory organs: Secondary | ICD-10-CM

## 2018-07-27 DIAGNOSIS — F17200 Nicotine dependence, unspecified, uncomplicated: Secondary | ICD-10-CM

## 2018-07-27 DIAGNOSIS — F17210 Nicotine dependence, cigarettes, uncomplicated: Secondary | ICD-10-CM | POA: Diagnosis not present

## 2018-07-27 DIAGNOSIS — R918 Other nonspecific abnormal finding of lung field: Secondary | ICD-10-CM

## 2018-09-12 ENCOUNTER — Ambulatory Visit: Payer: Self-pay | Admitting: Internal Medicine

## 2018-11-02 ENCOUNTER — Other Ambulatory Visit: Payer: Self-pay | Admitting: Internal Medicine

## 2018-12-05 NOTE — Progress Notes (Signed)
MEDICARE ANNUAL WELLNESS VISIT AND FOLLOW UP Assessment:   Michael [name] was seen today for follow-up and medicare wellness.  Diagnoses and all orders for this visit:  Welcome to Medicare preventive visit -     EKG 12-Lead -     [place], RETROPERITNL ABD,  LTD  Aortic atherosclerosis (HCC) Control blood pressure, cholesterol, glucose, increase exercise.   Chronic obstructive pulmonary disease, unspecified COPD type ([place]) Quit smoking, getting CT screening, improved with trellegy  Pulmonary nodules Follow up Ct is scheduled 01/2019 Advised to quit smoking  Smoker Discussed risks associated with tobacco use and advised to reduce or quit Patient is not ready to do so, but advised to consider strongly Will follow up at the next visit -     EKG 12-Lead -     [place], RETROPERITNL ABD,  LTD  Thrombocytopenia ([place]) -     CBC with Differential/Platelet  Essential hypertension Continue medication Monitor blood pressure at home; call if consistently over 130/80 Continue DASH diet.   Reminder to go to the ER if any CP, SOB, nausea, dizziness, severe HA, changes vision/speech, left arm numbness and tingling and jaw pain. -     CBC with Differential/Platelet -     COMPLETE METABOLIC PANEL WITH GFR -     Magnesium -     EKG 12-Lead -     [place], RETROPERITNL ABD,  LTD  ASHD s/p Stent Control blood pressure, cholesterol, glucose, increase exercise.  Followed by cardiology  BPH with obstruction/lower urinary tract symptoms Monitor annual PSA; symptoms controlled by saw palmetto   Elevated PSA Continue annual monitoring; had neg biopsy x 3 by Dr. Kellie Simmering  Vitamin D deficiency At goal at recent check; continue to recommend supplementation for goal of 60-100 Defer vitamin D level  Hyperlipidemia, mixed Patient declines medications, understands risk Historically fairly controlled with lifestyle Continue low cholesterol diet and exercise.  Check lipid panel.  -     Lipid panel -     TSH  BMI  22.0-22.9, adult  Screening for colon cancer ? Overdue for recommended 5 year follow up by Dr. Audie Box - will have GI review and schedule if appropriate -     Ambulatory referral to Gastroenterology   Over 30 minutes of exam, counseling, chart review, and critical decision making was performed  Future Appointments  Date Time Provider [place]  01/30/2019  8:00 AM [facility] 1 [facility]  03/13/2019  9:00 AM Unk Pinto, MD [facility] None     Plan:   During the course of the visit the patient was educated and counseled about appropriate screening and preventive services including:    Pneumococcal vaccine   Influenza vaccine  Prevnar 13  Td vaccine  Screening electrocardiogram  Colorectal cancer screening  Diabetes screening  Glaucoma screening  Nutrition counseling    Subjective:  Michael [name] is a 68 y.o. male who presents for Medicare Annual Wellness Visit and 3 month follow up for HTN, hyperlipidemia, glucose management, and vitamin D Def.   he currently continues to smoke 1 pack a day; discussed risks associated with smoking, patient is not ready to quit. He had low dose lung CT screening on 07/2018 which showed a nodule lung-RADS 3S and was recommended 6 month follow up study which is scheduled for 01/30/2019.    He has COPD and recently transitioned from [facility] to trellegy ellipta with perceived benefit but is expensive, paying $307 monthly.   BMI is Body mass index is 22.08 kg/m., he  has been working on diet and exercise. 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 He is recently established with 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 His blood pressure has been controlled  at home, today their BP is BP: 118/64 He does workout. He denies chest pain, shortness of breath, dizziness.   He is on cholesterol medication (rosuvastatin 40 mg twice weekly was recommended but never started, strong patient preference, historically has controlled reasonably with diet) and denies myalgias. His cholesterol is not at goal. The cholesterol last visit was:   Lab Results  Component Value Date   CHOL 201 (H) 06/21/2018   HDL 49 06/21/2018   LDLCALC 122 (H) 06/21/2018   TRIG 188 (H) 06/21/2018   CHOLHDL 4.1 06/21/2018   He has been working on diet and exercise for glucose management, and denies increased appetite, nausea, paresthesia of the feet, polydipsia, polyuria and visual disturbances. Last A1C in the office was:  Lab Results  Component Value Date   HGBA1C 5.5 02/28/2018   Last GFR Lab Results  Component Value Date   GFRNONAA 86 06/21/2018   Patient is on Vitamin D supplement.   Lab Results  Component Value Date   VD25OH 60 02/28/2018       He is previously established with [name] for BPH/elevated PSAs which have been stable- negative biopsy x3 in the past.  Lab Results  Component Value Date   PSA 6.9 (H) 02/28/2018   PSA 7.2 (H) 02/10/2017   PSA 8.04 (H) 12/25/2013     Medication Review:   Current Outpatient Medications (Cardiovascular):  .  bisoprolol-hydrochlorothiazide (ZIAC) 10-6.25 MG tablet, Take 1 tablet Daily for BP .  rosuvastatin (CRESTOR) 40 MG tablet, Take 1 tablet every other day as directed for cholesterol. (Patient not taking: Reported on 12/06/2018)  Current Outpatient Medications (Respiratory):  .  albuterol (PROVENTIL HFA;VENTOLIN HFA) 108 (90 BASE) MCG/ACT inhaler, Inhale 1-2 puffs into the lungs every 6 (six) hours as needed for wheezing or shortness of breath. .  Fluticasone-Umeclidin-Vilant (TRELEGY ELLIPTA) 100-62.5-25 MCG/INH AEPB, Inhale 1 puff into the lungs daily.  Current Outpatient Medications (Analgesics):  .  aspirin EC  81 MG tablet, Take 81 mg by mouth daily.   Current Outpatient Medications (Other):  .  cholecalciferol (VITAMIN D) 1000 UNITS tablet, 2,000 Units. 4000 total a day  .  Multiple Vitamin (MULTIVITAMIN) tablet, Take 1 tablet by mouth daily. Takes a vitamin pack daily .  saw palmetto 500 MG capsule, Take 500 mg by mouth daily.  Allergies: Allergies  Allergen Reactions  . Ace Inhibitors Cough  . Ciprofloxacin   . Fenofibrate     dizzy    Current Problems (verified) has BPH with obstruction/lower urinary tract symptoms; Vitamin D deficiency; COPD (chronic obstructive pulmonary disease) ([place]); Hypertension; Hyperlipidemia, mixed; Abnormal glucose; Smoker; ASHD s/p Stent; History of amputation of finger of left hand; Thrombocytopenia ([place]); Elevated PSA; Aortic atherosclerosis ([place]); FHx: heart disease; Fatigue; and Pulmonary nodules on their problem list.  Screening Tests Immunization History  Administered Date(s) Administered  . PPD Test 12/25/2013  . Pneumococcal Conjugate-13 02/28/2018  . Pneumococcal-Unspecified 12/08/2010  . Tdap 12/08/2010    Preventative care:  Last colonoscopy: 10/2012 Dr. Audie Box recommended 5 year follow up? Vs 10- will have GI review  Prior vaccinations: TD or Tdap: 2012 Influenza: declines  Pneumococcal: 2012 Prevnar13: 02/2018 Shingles/Zostavax: declines   Names of Other Physician/Practitioners you currently use: 1. [facility] here for primary care 2. Dr. Nicki Reaper, eye doctor, last visit 2018  3. , dentist, last visit remote, has top dentures  Patient Care Team: Unk Pinto, MD as PCP - General (Internal Medicine) Lowella Bandy, MD as Consulting Physician (Urology)  Surgical: He  has a past surgical history that includes Prostate surgery; Amputation finger / thumb (Left, 1991); Vasectomy; colonoscopy (N/A, 10/2012); and Cardiac catheterization (N/A, 2005). Family His family history includes Cancer in his  maternal aunt, maternal uncle, and maternal uncle; Cancer (age of onset: 78) in his father; Heart failure in his maternal grandfather; Liver disease in his paternal grandfather; Other in his son; Pneumonia in his paternal grandmother; Stroke in his maternal grandmother; Stroke (age of onset: 19) in his son. Social history  He reports that he has been smoking cigarettes. He has a 36.00 pack-year smoking history. He has never used smokeless tobacco. He reports current alcohol use of about 5.0 standard drinks of alcohol per week. He reports that he does not use drugs.  MEDICARE WELLNESS OBJECTIVES: Physical activity: Current Exercise Habits: The patient has a physically strenuous job, but has no regular exercise apart from work., Exercise limited by: None identified Cardiac risk factors: Cardiac Risk Factors include: advanced age (>88men, >68 women);male gender;hypertension;dyslipidemia;smoking/ tobacco exposure;family history of premature cardiovascular disease Depression/mood screen:   Depression screen [facility] 2/9 12/06/2018  Decreased Interest 0  Down, Depressed, Hopeless 0  PHQ - 2 Score 0    ADLs:  In your present state of health, do you have any difficulty performing the following activities: 12/06/2018  Hearing? N  Vision? N  Difficulty concentrating or making decisions? N  Walking or climbing stairs? N  Dressing or bathing? N  Doing errands, shopping? N  Some recent data might be hidden     Cognitive Testing  Alert? Yes  Normal Appearance?Yes  Oriented to person? Yes  Place? Yes   Time? Yes  Recall of three objects?  Yes  Can perform simple calculations? Yes  Displays appropriate judgment?Yes  Can read the correct time from a watch face?Yes  EOL planning: Does Patient Have a Medical Advance Directive?: No Would patient like information on creating a medical advance directive?: No - Patient declined   Objective:   Today's Vitals   12/06/18 0904  BP: 118/64  Pulse: 60  Temp:  (!) 97.3 F (36.3 C)  SpO2: 96%  Weight: 162 lb 12.8 oz (73.8 kg)  Height: 6' (1.829 m)   Body mass index is 22.08 kg/m.  General appearance: alert, no distress, WD/WN, male HEENT: normocephalic, sclerae anicteric, TMs pearly, nares patent, no discharge or erythema, pharynx normal Oral cavity: MMM, no lesions Neck: supple, no lymphadenopathy, no thyromegaly, no masses Heart: RRR, normal S1, S2, no murmurs Lungs: CTA bilaterally, no wheezes, rhonchi, or rales Abdomen: +bs, soft, non tender, non distended, no masses, no hepatomegaly, no splenomegaly Musculoskeletal: nontender, no swelling, no obvious deformity Extremities: no edema, no cyanosis, no clubbing Pulses: 2+ symmetric, upper and lower extremities, normal cap refill Neurological: alert, oriented x 3, CN2-12 intact, strength normal upper extremities and lower extremities, sensation normal throughout, DTRs 2+ throughout, no cerebellar signs, gait normal Psychiatric: normal affect, behavior normal, pleasant   Medicare Attestation I  have personally reviewed: The patient's medical and social history Their use of alcohol, tobacco or illicit drugs Their current medications and supplements The patient's functional ability including ADLs,fall risks, home safety risks, cognitive, and hearing and visual impairment Diet and physical activities Evidence for depression or mood disorders  The patient's weight, height, BMI, and visual acuity have been recorded in the chart.  I have made referrals, counseling, and provided education to the patient based on review of the above and I have provided the patient with a written personalized care plan for preventive services.     Izora Ribas, NP   12/06/2018

## 2018-12-06 ENCOUNTER — Other Ambulatory Visit: Payer: Self-pay

## 2018-12-06 ENCOUNTER — Encounter: Payer: Self-pay | Admitting: Adult Health

## 2018-12-06 ENCOUNTER — Ambulatory Visit (INDEPENDENT_AMBULATORY_CARE_PROVIDER_SITE_OTHER): Payer: Medicare Other | Admitting: Adult Health

## 2018-12-06 VITALS — BP 118/64 | HR 60 | Temp 97.3°F | Ht 72.0 in | Wt 162.8 lb

## 2018-12-06 DIAGNOSIS — E559 Vitamin D deficiency, unspecified: Secondary | ICD-10-CM | POA: Diagnosis not present

## 2018-12-06 DIAGNOSIS — E782 Mixed hyperlipidemia: Secondary | ICD-10-CM

## 2018-12-06 DIAGNOSIS — Z136 Encounter for screening for cardiovascular disorders: Secondary | ICD-10-CM | POA: Diagnosis not present

## 2018-12-06 DIAGNOSIS — R7309 Other abnormal glucose: Secondary | ICD-10-CM | POA: Diagnosis not present

## 2018-12-06 DIAGNOSIS — I700 Atherosclerosis of aorta: Secondary | ICD-10-CM | POA: Diagnosis not present

## 2018-12-06 DIAGNOSIS — Z6822 Body mass index (BMI) 22.0-22.9, adult: Secondary | ICD-10-CM | POA: Diagnosis not present

## 2018-12-06 DIAGNOSIS — I2510 Atherosclerotic heart disease of native coronary artery without angina pectoris: Secondary | ICD-10-CM

## 2018-12-06 DIAGNOSIS — N401 Enlarged prostate with lower urinary tract symptoms: Secondary | ICD-10-CM

## 2018-12-06 DIAGNOSIS — D696 Thrombocytopenia, unspecified: Secondary | ICD-10-CM

## 2018-12-06 DIAGNOSIS — I10 Essential (primary) hypertension: Secondary | ICD-10-CM | POA: Diagnosis not present

## 2018-12-06 DIAGNOSIS — R918 Other nonspecific abnormal finding of lung field: Secondary | ICD-10-CM | POA: Diagnosis not present

## 2018-12-06 DIAGNOSIS — R6889 Other general symptoms and signs: Secondary | ICD-10-CM

## 2018-12-06 DIAGNOSIS — Z0000 Encounter for general adult medical examination without abnormal findings: Secondary | ICD-10-CM

## 2018-12-06 DIAGNOSIS — R972 Elevated prostate specific antigen [PSA]: Secondary | ICD-10-CM | POA: Diagnosis not present

## 2018-12-06 DIAGNOSIS — Z0001 Encounter for general adult medical examination with abnormal findings: Secondary | ICD-10-CM | POA: Diagnosis not present

## 2018-12-06 DIAGNOSIS — Z1211 Encounter for screening for malignant neoplasm of colon: Secondary | ICD-10-CM

## 2018-12-06 DIAGNOSIS — J449 Chronic obstructive pulmonary disease, unspecified: Secondary | ICD-10-CM | POA: Diagnosis not present

## 2018-12-06 DIAGNOSIS — N138 Other obstructive and reflux uropathy: Secondary | ICD-10-CM

## 2018-12-06 DIAGNOSIS — F17200 Nicotine dependence, unspecified, uncomplicated: Secondary | ICD-10-CM

## 2018-12-06 NOTE — Patient Instructions (Addendum)
Michael [name], Thank you for taking time to come for your Medicare Wellness Visit. I appreciate your ongoing commitment to your health goals. Please review the following plan we discussed and let me know if I can assist you in the future.   These are the goals we discussed: Goals    . LDL CALC < 70    . Quit Smoking       This is a list of the screening recommended for you and due dates:  Health Maintenance  Topic Date Due  . Flu Shot  12/23/2018  . Pneumonia vaccines (2 of 2 - PPSV23) 03/01/2019  . Tetanus Vaccine  12/07/2020  . Colon Cancer Screening  11/08/2022  .  Hepatitis C: One time screening is recommended by Center for Disease Control  (CDC) for  adults born from [birth year] through 1966.   Completed    CT scan appointment :  01/30/2019  8:00 AM at [place]  Trellegy assistance program:  [email or web address]    Please be aware that continue to smoke with cause COPD to progress faster  Recommend you consider quitting smoking  Chronic Obstructive Pulmonary Disease  Chronic obstructive pulmonary disease (COPD) is a long-term (chronic) condition that affects the lungs. COPD is a general term that can be used to describe many different lung problems that cause lung swelling (inflammation) and limit airflow, including chronic bronchitis and emphysema. If you have COPD, your lung function will probably never return to normal. In most cases, it gets worse over time. However, there are steps you can take to slow the progression of the disease and improve your quality of life. What are the causes? This condition may be caused by:  Smoking. This is the most common cause.  Certain genes passed down through families. What increases the risk? The following factors may make you more likely to develop this condition:  Secondhand smoke from cigarettes, pipes, or cigars.  Exposure to chemicals and other irritants such as fumes and dust in the work  environment.  Chronic lung conditions or infections. What are the signs or symptoms? Symptoms of this condition include:  Shortness of breath, especially during physical activity.  Chronic cough with a large amount of thick mucus. Sometimes the cough may not have any mucus (dry cough).  Wheezing.  Rapid breaths.  Gray or bluish discoloration (cyanosis) of the skin, especially in your fingers, toes, or lips.  Feeling tired (fatigue).  Weight loss.  Chest tightness.  Frequent infections.  Episodes when breathing symptoms become much worse (exacerbations).  Swelling in the ankles, feet, or legs. This may occur in later stages of the disease. How is this diagnosed? This condition is diagnosed based on:  Your medical history.  A physical exam. You may also have tests, including:  Lung (pulmonary) function tests. This may include a spirometry test, which measures your ability to exhale properly.  Chest X-ray.  CT scan.  Blood tests. How is this treated? This condition may be treated with:  Medicines. These may include inhaled rescue medicines to treat acute exacerbations as well as long-term, or maintenance, medicines to prevent flare-ups of COPD. ? Bronchodilators help treat COPD by dilating the airways to allow increased airflow and make your breathing more comfortable. ? Steroids can reduce airway inflammation and help prevent exacerbations.  Smoking cessation. If you smoke, your health care provider may ask you to quit, and may also recommend therapy or replacement products to help you quit.  Pulmonary rehabilitation. This  may involve working with a team of health care providers and specialists, such as respiratory, occupational, and physical therapists.  Exercise and physical activity. These are beneficial for nearly all people with COPD.  Nutrition therapy to gain weight, if you are underweight.  Oxygen. Supplemental oxygen therapy is only helpful if you have  a low oxygen level in your blood (hypoxemia).  Lung surgery or transplant.  Palliative care. This is to help people with COPD feel comfortable when treatment is no longer working. Follow these instructions at home: Medicines  Take over-the-counter and prescription medicines (inhaled or pills) only as told by your health care provider.  Talk to your health care provider before taking any cough or allergy medicines. You may need to avoid certain medicines that dry out your airways. Lifestyle  If you are a smoker, the most important thing that you can do is to stop smoking. Do not use any products that contain nicotine or tobacco, such as cigarettes and e-cigarettes. If you need help quitting, ask your health care provider. Continuing to smoke will cause the disease to progress faster.  Avoid exposure to things that irritate your lungs, such as smoke, chemicals, and fumes.  Stay active, but balance activity with periods of rest. Exercise and physical activity will help you maintain your ability to do things you want to do.  Learn and use relaxation techniques to manage stress and to control your breathing.  Get the right amount of sleep and get quality sleep. Most adults need 7 or more hours per night.  Eat healthy foods. Eating smaller, more frequent meals and resting before meals may help you maintain your strength. Controlled breathing Learn and use controlled breathing techniques as directed by your health care provider. Controlled breathing techniques include:  Pursed lip breathing. Start by breathing in (inhaling) through your nose for 1 second. Then, purse your lips as if you were going to whistle and breathe out (exhale) through the pursed lips for 2 seconds.  Diaphragmatic breathing. Start by putting one hand on your abdomen just above your waist. Inhale slowly through your nose. The hand on your abdomen should move out. Then purse your lips and exhale slowly. You should be able to  feel the hand on your abdomen moving in as you exhale. Controlled coughing Learn and use controlled coughing to clear mucus from your lungs. Controlled coughing is a series of short, progressive coughs. The steps of controlled coughing are: 1. Lean your head slightly forward. 2. Breathe in deeply using diaphragmatic breathing. 3. Try to hold your breath for 3 seconds. 4. Keep your mouth slightly open while coughing twice. 5. Spit any mucus out into a tissue. 6. Rest and repeat the steps once or twice as needed. General instructions  Make sure you receive all the vaccines that your health care provider recommends, especially the pneumococcal and influenza vaccines. Preventing infection and hospitalization is very important when you have COPD.  Use oxygen therapy and pulmonary rehabilitation if directed to by your health care provider. If you require home oxygen therapy, ask your health care provider whether you should purchase a pulse oximeter to measure your oxygen level at home.  Work with your health care provider to develop a COPD action plan. This will help you know what steps to take if your condition gets worse.  Keep other chronic health conditions under control as told by your health care provider.  Avoid extreme temperature and humidity changes.  Avoid contact with people who have an  illness that spreads from person to person (is contagious), such as viral infections or pneumonia.  Keep all follow-up visits as told by your health care provider. This is important. Contact a health care provider if:  You are coughing up more mucus than usual.  There is a change in the color or thickness of your mucus.  Your breathing is more labored than usual.  Your breathing is faster than usual.  You have difficulty sleeping.  You need to use your rescue medicines or inhalers more often than expected.  You have trouble doing routine activities such as getting dressed or walking around  the house. Get help right away if:  You have shortness of breath while you are resting.  You have shortness of breath that prevents you from: ? Being able to talk. ? Performing your usual physical activities.  You have chest pain lasting longer than 5 minutes.  Your skin color is more blue (cyanotic) than usual.  You measure low oxygen saturations for longer than 5 minutes with a pulse oximeter.  You have a fever.  You feel too tired to breathe normally. Summary  Chronic obstructive pulmonary disease (COPD) is a long-term (chronic) condition that affects the lungs.  Your lung function will probably never return to normal. In most cases, it gets worse over time. However, there are steps you can take to slow the progression of the disease and improve your quality of life.  Treatment for COPD may include taking medicines, quitting smoking, pulmonary rehabilitation, and changes to diet and exercise. As the disease progresses, you may need oxygen therapy, a lung transplant, or palliative care.  To help manage your condition, do not smoke, avoid exposure to things that irritate your lungs, stay up to date on all vaccines, and follow your health care provider's instructions for taking medicines. This information is not intended to replace advice given to you by your health care provider. Make sure you discuss any questions you have with your health care provider. Document Released: 02/17/2005 Document Revised: 04/22/2017 Document Reviewed: 06/14/2016 Elsevier Patient Education  2020 [employer].

## 2018-12-07 LAB — COMPLETE METABOLIC PANEL WITH GFR
AG Ratio: 2 (calc) (ref 1.0–2.5)
ALT: 12 U/L (ref 9–46)
AST: 16 U/L (ref 10–35)
Albumin: 4 g/dL (ref 3.6–5.1)
Alkaline phosphatase (APISO): 84 U/L (ref 35–144)
BUN: 18 mg/dL (ref 7–25)
CO2: 31 mmol/L (ref 20–32)
Calcium: 9.1 mg/dL (ref 8.6–10.3)
Chloride: 105 mmol/L (ref 98–110)
Creat: 0.87 mg/dL (ref 0.70–1.25)
GFR, Est African American: 102 mL/min/{1.73_m2} (ref >=60)
GFR, Est Non African American: 88 mL/min/{1.73_m2} (ref >=60)
Globulin: 2 g/dL (calc) (ref 1.9–3.7)
Glucose, Bld: 88 mg/dL (ref 65–99)
Potassium: 4.4 mmol/L (ref 3.5–5.3)
Sodium: 139 mmol/L (ref 135–146)
Total Bilirubin: 0.8 mg/dL (ref 0.2–1.2)
Total Protein: 6 g/dL — ABNORMAL LOW (ref 6.1–8.1)

## 2018-12-07 LAB — CBC WITH DIFFERENTIAL/PLATELET
Absolute Monocytes: 795 cells/uL (ref 200–950)
Basophils Absolute: 90 cells/uL (ref 0–200)
Basophils Relative: 1.1 %
Eosinophils Absolute: 123 cells/uL (ref 15–500)
Eosinophils Relative: 1.5 %
HCT: 39.7 % (ref 38.5–50.0)
Hemoglobin: 13.5 g/dL (ref 13.2–17.1)
Lymphs Abs: 2107 cells/uL (ref 850–3900)
MCH: 31.6 pg (ref 27.0–33.0)
MCHC: 34 g/dL (ref 32.0–36.0)
MCV: 93 fL (ref 80.0–100.0)
MPV: 9.8 fL (ref 7.5–12.5)
Monocytes Relative: 9.7 %
Neutro Abs: 5084 cells/uL (ref 1500–7800)
Neutrophils Relative %: 62 %
Platelets: 188 10*3/uL (ref 140–400)
RBC: 4.27 10*6/uL (ref 4.20–5.80)
RDW: 13.4 % (ref 11.0–15.0)
Total Lymphocyte: 25.7 %
WBC: 8.2 10*3/uL (ref 3.8–10.8)

## 2018-12-07 LAB — LIPID PANEL
Cholesterol: 222 mg/dL — ABNORMAL HIGH (ref <200)
HDL: 44 mg/dL (ref >=40)
LDL Cholesterol (Calc): 145 mg/dL (calc) — ABNORMAL HIGH
Non-HDL Cholesterol (Calc): 178 mg/dL (calc) — ABNORMAL HIGH (ref <130)
Total CHOL/HDL Ratio: 5 (calc) — ABNORMAL HIGH (ref <5.0)
Triglycerides: 189 mg/dL — ABNORMAL HIGH (ref <150)

## 2018-12-07 LAB — TSH: TSH: 1.83 mIU/L (ref 0.40–4.50)

## 2018-12-07 LAB — MAGNESIUM: Magnesium: 2.1 mg/dL (ref 1.5–2.5)

## 2018-12-21 ENCOUNTER — Encounter: Payer: Self-pay | Admitting: Internal Medicine

## 2018-12-21 ENCOUNTER — Encounter: Payer: Self-pay | Admitting: Adult Health

## 2018-12-21 DIAGNOSIS — Z86010 Personal history of colonic polyps: Secondary | ICD-10-CM | POA: Insufficient documentation

## 2019-01-03 DIAGNOSIS — Z1211 Encounter for screening for malignant neoplasm of colon: Secondary | ICD-10-CM | POA: Diagnosis not present

## 2019-01-03 DIAGNOSIS — Z86010 Personal history of colonic polyps: Secondary | ICD-10-CM | POA: Diagnosis not present

## 2019-01-09 DIAGNOSIS — I252 Old myocardial infarction: Secondary | ICD-10-CM | POA: Diagnosis not present

## 2019-01-09 DIAGNOSIS — K621 Rectal polyp: Secondary | ICD-10-CM | POA: Diagnosis not present

## 2019-01-09 DIAGNOSIS — J449 Chronic obstructive pulmonary disease, unspecified: Secondary | ICD-10-CM | POA: Diagnosis not present

## 2019-01-09 DIAGNOSIS — K648 Other hemorrhoids: Secondary | ICD-10-CM | POA: Diagnosis not present

## 2019-01-09 DIAGNOSIS — I10 Essential (primary) hypertension: Secondary | ICD-10-CM | POA: Diagnosis not present

## 2019-01-09 DIAGNOSIS — D123 Benign neoplasm of transverse colon: Secondary | ICD-10-CM | POA: Diagnosis not present

## 2019-01-09 DIAGNOSIS — Z7982 Long term (current) use of aspirin: Secondary | ICD-10-CM | POA: Diagnosis not present

## 2019-01-09 DIAGNOSIS — Z86010 Personal history of colonic polyps: Secondary | ICD-10-CM | POA: Diagnosis not present

## 2019-01-09 DIAGNOSIS — Z888 Allergy status to other drugs, medicaments and biological substances status: Secondary | ICD-10-CM | POA: Diagnosis not present

## 2019-01-09 DIAGNOSIS — Z881 Allergy status to other antibiotic agents status: Secondary | ICD-10-CM | POA: Diagnosis not present

## 2019-01-09 DIAGNOSIS — Z79899 Other long term (current) drug therapy: Secondary | ICD-10-CM | POA: Diagnosis not present

## 2019-01-09 DIAGNOSIS — Z1159 Encounter for screening for other viral diseases: Secondary | ICD-10-CM | POA: Diagnosis not present

## 2019-01-09 DIAGNOSIS — Z1211 Encounter for screening for malignant neoplasm of colon: Secondary | ICD-10-CM | POA: Diagnosis not present

## 2019-01-12 DIAGNOSIS — D123 Benign neoplasm of transverse colon: Secondary | ICD-10-CM | POA: Diagnosis not present

## 2019-01-12 DIAGNOSIS — J449 Chronic obstructive pulmonary disease, unspecified: Secondary | ICD-10-CM | POA: Diagnosis not present

## 2019-01-12 DIAGNOSIS — Z1211 Encounter for screening for malignant neoplasm of colon: Secondary | ICD-10-CM | POA: Diagnosis not present

## 2019-01-12 DIAGNOSIS — Z86010 Personal history of colonic polyps: Secondary | ICD-10-CM | POA: Diagnosis not present

## 2019-01-12 DIAGNOSIS — K621 Rectal polyp: Secondary | ICD-10-CM | POA: Diagnosis not present

## 2019-01-12 DIAGNOSIS — K648 Other hemorrhoids: Secondary | ICD-10-CM | POA: Diagnosis not present

## 2019-01-12 DIAGNOSIS — K635 Polyp of colon: Secondary | ICD-10-CM | POA: Diagnosis not present

## 2019-01-12 DIAGNOSIS — D128 Benign neoplasm of rectum: Secondary | ICD-10-CM | POA: Diagnosis not present

## 2019-01-12 LAB — HM COLONOSCOPY

## 2019-01-30 ENCOUNTER — Inpatient Hospital Stay: Admission: RE | Admit: 2019-01-30 | Payer: 59 | Source: Ambulatory Visit

## 2019-03-12 ENCOUNTER — Encounter: Payer: Self-pay | Admitting: Internal Medicine

## 2019-03-12 NOTE — Progress Notes (Signed)
Comprehensive Evaluation & Examination     This very nice 68 y.o. DWM presents for acomprehensive evaluation and management of multiple medical co-morbidities.  Patient has been followed for HTN, ASCAD,  HLD, Prediabetes and Vitamin D Deficiency. Patient also has COPD & is on maintenance Inhalers.     In March 2020, a screening LD Chest CT scan recommended a 6 month f/u CT scan to evaluate mulpiple pulmonary nodules & in particular a new RLL nodule to assure stability.      HTN predates circa 2004. Patient's BP has been controlled at home.  Today's BP is at goal - 120/66.  In 2005, he had Acute MI with Stent implanted and in 2007 and 2010, he had Negative Nuclear stress tests. Patient denies any cardiac symptoms as chest pain, palpitations, shortness of breath, dizziness or ankle swelling.     Patient's hyperlipidemia is controlled with diet and Zetia. He take Rosuvastatin 1 x /week due to concern of Statin effect on his liver.  Patient denies myalgias or other medication SE's. Last lipids were not at goal:  Lab Results  Component Value Date   CHOL 222 (H) 12/06/2018   HDL 44 12/06/2018   LDLCALC 145 (H) 12/06/2018   TRIG 189 (H) 12/06/2018   CHOLHDL 5.0 (H) 12/06/2018      Patient has hx/o prediabetes (A1c 5.7% / 2010) and patient denies reactive hypoglycemic symptoms, visual blurring, diabetic polys or paresthesias. Last A1c was Normal & at goal:  Lab Results  Component Value Date   HGBA1C 5.5 02/28/2018       Finally, patient has history of Vitamin D Deficiency ("25" / 2009)  and last vitamin D was at goal:  Lab Results  Component Value Date   VD25OH 73 02/28/2018   Current Outpatient Medications on File Prior to Visit  Medication Sig  . albuterol (PROVENTIL HFA;VENTOLIN HFA) 108 (90 BASE) MCG/ACT inhaler Inhale 1-2 puffs into the lungs every 6 (six) hours as needed for wheezing or shortness of breath.  Marland Kitchen aspirin EC 81 MG tablet Take 81 mg by mouth daily.  .  bisoprolol-hydrochlorothiazide (ZIAC) 10-6.25 MG tablet Take 1 tablet Daily for BP  . cholecalciferol (VITAMIN D) 1000 UNITS tablet 2,000 Units. 4000 total a day   . Fluticasone-Umeclidin-Vilant (TRELEGY ELLIPTA) 100-62.5-25 MCG/INH AEPB Inhale 1 puff into the lungs daily.  . Multiple Vitamin (MULTIVITAMIN) tablet Take 1 tablet by mouth daily. Takes a vitamin pack daily  . saw palmetto 500 MG capsule Take 500 mg by mouth daily.  . rosuvastatin (CRESTOR) 40 MG tablet Take 1 tablet every other day as directed for cholesterol. (Patient not taking: Reported on 12/06/2018)   No current facility-administered medications on file prior to visit.    Allergies  Allergen Reactions  . Ace Inhibitors Cough  . Ciprofloxacin   . Fenofibrate     dizzy   Past Medical History:  Diagnosis Date  . BPH (benign prostatic hyperplasia)   . COPD (chronic obstructive pulmonary disease) ([place])    via CT lung  . GERD (gastroesophageal reflux disease)   . Hyperlipidemia   . Hypertension   . Hypogonadism male   . Myocardial infarction [facility]) 2005   Per patient no stent or PTCA, just medical treatment  . Prediabetes   . Vitamin D deficiency    Health Maintenance  Topic Date Due  . INFLUENZA VACCINE  12/23/2018  . PNA vac Low Risk Adult (2 of 2 - PPSV23) 03/01/2019  . TETANUS/TDAP  12/07/2020  .  COLONOSCOPY  01/12/2024  . Hepatitis C Screening  Completed   Immunization History  Administered Date(s) Administered  . PPD Test 12/25/2013  . Pneumococcal Conjugate-13 02/28/2018  . Pneumococcal-Unspecified 12/08/2010  . Tdap 12/08/2010   Last Colon - 11/10/2012 Dr Unknown Foley recc 5 yr f/u due June /July 2019 - Overdue  Past Surgical History:  Procedure Laterality Date  . AMPUTATION FINGER / THUMB Left 1991   distal index  . CARDIAC CATHETERIZATION N/A 2005   Per patient no stent or PTCA, just medical treatment  . CATARACT EXTRACTION, BILATERAL Bilateral 2014   Dr. Talbert Forest   . colonoscopy N/A 10/2012    Neg and recc f/u Cololnoscopy in 5 yeary - Dr Nicki Reaper [place] - Gastroenterologist  . PROSTATE SURGERY     Biopsy X 3  . VASECTOMY     Family History  Problem Relation Age of Onset  . Cancer Father 26       lymphoma  . Cancer Maternal Aunt        Lung  . Cancer Maternal Uncle        Lung  . Cancer Maternal Uncle        Lung  . Stroke Maternal Grandmother   . Heart failure Maternal Grandfather   . Pneumonia Paternal Grandmother   . Liver disease Paternal Grandfather   . Stroke Son 26  . Other Son        internal intestinal hemorage   Social History   Socioeconomic History  . Marital status: Divorced  . Number of children: Not on file  Occupational History  .   Tobacco Use  . Smoking status: Current Every Day Smoker    Packs/day: 1.00    Years: 36.00    Pack years: 36.00    Types: Cigarettes  . Smokeless tobacco: Never Used  Substance and Sexual Activity  . Alcohol use: Yes    Alcohol/week: 5.0 standard drinks    Types: 5 Cans of beer per week  . Drug use: No  . Sexual activity: Not on file  Social History Narrative   Epworth Sleepiness score 5     ROS Constitutional: Denies fever, chills, weight loss/gain, headaches, insomnia,  night sweats or change in appetite. Does c/o fatigue. Eyes: Denies redness, blurred vision, diplopia, discharge, itchy or watery eyes.  ENT: Denies discharge, congestion, post nasal drip, epistaxis, sore throat, earache, hearing loss, dental pain, Tinnitus, Vertigo, Sinus pain or snoring.  Cardio: Denies chest pain, palpitations, irregular heartbeat, syncope, dyspnea, diaphoresis, orthopnea, PND, claudication or edema Respiratory: denies cough, dyspnea, DOE, pleurisy, hoarseness, laryngitis or wheezing.  Gastrointestinal: Denies dysphagia, heartburn, reflux, water brash, pain, cramps, nausea, vomiting, bloating, diarrhea, constipation, hematemesis, melena, hematochezia, jaundice or hemorrhoids Genitourinary: Denies dysuria, frequency, urgency,  nocturia, hesitancy, discharge, hematuria or flank pain Musculoskeletal: Denies arthralgia, myalgia, stiffness, Jt. Swelling, pain, limp or strain/sprain. Denies Falls. Skin: Denies puritis, rash, hives, warts, acne, eczema or change in skin lesion Neuro: No weakness, tremor, incoordination, spasms, paresthesia or pain Psychiatric: Denies confusion, memory loss or sensory loss. Denies Depression. Endocrine: Denies change in weight, skin, hair change, nocturia, and paresthesia, diabetic polys, visual blurring or hyper / hypo glycemic episodes.  Heme/Lymph: No excessive bleeding, bruising or enlarged lymph nodes.  Physical Exam  BP 120/66   Pulse 60   Temp (!) 97.5 F (36.4 C)   Resp 16   Ht 6' (1.829 m)   Wt 164 lb 12.8 oz (74.8 kg)   BMI 22.35 kg/m   General Appearance:  Well nourished and well groomed and in no apparent distress.  Eyes: PERRLA, EOMs, conjunctiva no swelling or erythema, normal fundi and vessels. Sinuses: No frontal/maxillary tenderness ENT/Mouth: EACs patent / TMs  nl. Nares clear without erythema, swelling, mucoid exudates. Oral hygiene is good. No erythema, swelling, or exudate. Tongue normal, non-obstructing. Tonsils not swollen or erythematous. Hearing normal.  Neck: Supple, thyroid not palpable. No bruits, nodes or JVD. Respiratory: Respiratory effort normal.  BS equal and clear bilateral without rales, rhonci, wheezing or stridor. Cardio: Heart sounds are normal with regular rate and rhythm and no murmurs, rubs or gallops. Peripheral pulses are normal and equal bilaterally without edema. No aortic or femoral bruits. Chest: symmetric with normal excursions and percussion.  Abdomen: Soft, with Nl bowel sounds. Nontender, no guarding, rebound, hernias, masses, or organomegaly.  Lymphatics: Non tender without lymphadenopathy.  Musculoskeletal: Full ROM all peripheral extremities, joint stability, 5/5 strength, and normal gait. Skin: Warm and dry without rashes,  lesions, cyanosis, clubbing or  ecchymosis.  Neuro: Cranial nerves intact, reflexes equal bilaterally. Normal muscle tone, no cerebellar symptoms. Sensation intact.  Pysch: Alert and oriented X 3 with normal affect, insight and judgment appropriate.   Assessment and Plan  1. Essential hypertension  - EKG 12-Lead - [place], RETROPERITNL ABD,  LTD - Urinalysis, Routine w reflex microscopic - Microalbumin / creatinine urine ratio - CBC with Differential/Platelet - COMPLETE METABOLIC PANEL WITH GFR - Magnesium - TSH  2. Hyperlipidemia, mixed  - EKG 12-Lead - [place], RETROPERITNL ABD,  LTD - Lipid panel - TSH  3. Abnormal glucose  - EKG 12-Lead - [place], RETROPERITNL ABD,  LTD - Hemoglobin A1c - Insulin, random  4. Vitamin D deficiency  - VITAMIN D 25 Hydroxyl  5. Prediabetes  - EKG 12-Lead - [place], RETROPERITNL ABD,  LTD - Hemoglobin A1c - Insulin, random  6. ASHD s/p Stent  - EKG 12-Lead - Lipid panel  7. Aortic atherosclerosis (HCC)  - [place], RETROPERITNL ABD,  LTD  8. Chronic obstructive pulmonary disease ([place])   9. BPH with obstruction/lower urinary tract symptoms  - PSA  10. Elevated PSA  - PSA  11. Smoker  - EKG 12-Lead - [place], RETROPERITNL ABD,  LTD  12. Prostate cancer screening  - PSA  13. Screening for colon cancer  - POC Hemoccult Bld/Stl  14. Screening for colorectal cancer   15. Screening for ischemic heart disease  - EKG 12-Lead  16. FHx: heart disease  - EKG 12-Lead - [place], RETROPERITNL ABD,  LTD  17. Screening for AAA (aortic abdominal aneurysm)  - [place], RETROPERITNL ABD,  LTD  18. Medication management  - Urinalysis, Routine w reflex microscopic - Microalbumin / creatinine urine ratio - CBC with Differential/Platelet - COMPLETE METABOLIC PANEL WITH GFR - Magnesium - Lipid panel - TSH - Hemoglobin A1c - Insulin, random - VITAMIN D 25 Hydroxyl        Patient was counseled in prudent diet, weight control to achieve/maintain BMI  less than 25, BP monitoring, regular exercise and medications as discussed.  Discussed med effects and SE's. Routine screening labs and tests as requested with regular follow-up as recommended. Over 40 minutes of exam, counseling, chart review and high complex critical decision making was performed   Kirtland Bouchard, MD

## 2019-03-12 NOTE — Patient Instructions (Signed)

## 2019-03-13 ENCOUNTER — Other Ambulatory Visit: Payer: Self-pay

## 2019-03-13 ENCOUNTER — Encounter: Payer: Self-pay | Admitting: Internal Medicine

## 2019-03-13 ENCOUNTER — Ambulatory Visit (INDEPENDENT_AMBULATORY_CARE_PROVIDER_SITE_OTHER): Payer: Medicare Other | Admitting: Internal Medicine

## 2019-03-13 VITALS — BP 120/66 | HR 60 | Temp 97.5°F | Resp 16 | Ht 72.0 in | Wt 164.8 lb

## 2019-03-13 DIAGNOSIS — E559 Vitamin D deficiency, unspecified: Secondary | ICD-10-CM

## 2019-03-13 DIAGNOSIS — N138 Other obstructive and reflux uropathy: Secondary | ICD-10-CM

## 2019-03-13 DIAGNOSIS — I700 Atherosclerosis of aorta: Secondary | ICD-10-CM | POA: Diagnosis not present

## 2019-03-13 DIAGNOSIS — I10 Essential (primary) hypertension: Secondary | ICD-10-CM | POA: Diagnosis not present

## 2019-03-13 DIAGNOSIS — Z125 Encounter for screening for malignant neoplasm of prostate: Secondary | ICD-10-CM | POA: Diagnosis not present

## 2019-03-13 DIAGNOSIS — R7309 Other abnormal glucose: Secondary | ICD-10-CM | POA: Diagnosis not present

## 2019-03-13 DIAGNOSIS — J449 Chronic obstructive pulmonary disease, unspecified: Secondary | ICD-10-CM | POA: Diagnosis not present

## 2019-03-13 DIAGNOSIS — N401 Enlarged prostate with lower urinary tract symptoms: Secondary | ICD-10-CM

## 2019-03-13 DIAGNOSIS — F17200 Nicotine dependence, unspecified, uncomplicated: Secondary | ICD-10-CM

## 2019-03-13 DIAGNOSIS — Z79899 Other long term (current) drug therapy: Secondary | ICD-10-CM | POA: Diagnosis not present

## 2019-03-13 DIAGNOSIS — R972 Elevated prostate specific antigen [PSA]: Secondary | ICD-10-CM

## 2019-03-13 DIAGNOSIS — I2510 Atherosclerotic heart disease of native coronary artery without angina pectoris: Secondary | ICD-10-CM | POA: Diagnosis not present

## 2019-03-13 DIAGNOSIS — Z8249 Family history of ischemic heart disease and other diseases of the circulatory system: Secondary | ICD-10-CM

## 2019-03-13 DIAGNOSIS — Z136 Encounter for screening for cardiovascular disorders: Secondary | ICD-10-CM

## 2019-03-13 DIAGNOSIS — Z1212 Encounter for screening for malignant neoplasm of rectum: Secondary | ICD-10-CM

## 2019-03-13 DIAGNOSIS — E782 Mixed hyperlipidemia: Secondary | ICD-10-CM | POA: Diagnosis not present

## 2019-03-13 DIAGNOSIS — Z1211 Encounter for screening for malignant neoplasm of colon: Secondary | ICD-10-CM

## 2019-03-13 DIAGNOSIS — R7303 Prediabetes: Secondary | ICD-10-CM

## 2019-03-14 LAB — COMPLETE METABOLIC PANEL WITH GFR
AG Ratio: 1.7 (calc) (ref 1.0–2.5)
ALT: 15 U/L (ref 9–46)
AST: 19 U/L (ref 10–35)
Albumin: 4 g/dL (ref 3.6–5.1)
Alkaline phosphatase (APISO): 102 U/L (ref 35–144)
BUN: 17 mg/dL (ref 7–25)
CO2: 25 mmol/L (ref 20–32)
Calcium: 9.1 mg/dL (ref 8.6–10.3)
Chloride: 104 mmol/L (ref 98–110)
Creat: 0.88 mg/dL (ref 0.70–1.25)
GFR, Est African American: 102 mL/min/{1.73_m2} (ref >=60)
GFR, Est Non African American: 88 mL/min/{1.73_m2} (ref >=60)
Globulin: 2.3 g/dL (calc) (ref 1.9–3.7)
Glucose, Bld: 84 mg/dL (ref 65–99)
Potassium: 4.2 mmol/L (ref 3.5–5.3)
Sodium: 140 mmol/L (ref 135–146)
Total Bilirubin: 0.5 mg/dL (ref 0.2–1.2)
Total Protein: 6.3 g/dL (ref 6.1–8.1)

## 2019-03-14 LAB — LIPID PANEL
Cholesterol: 218 mg/dL — ABNORMAL HIGH (ref <200)
HDL: 43 mg/dL (ref >=40)
LDL Cholesterol (Calc): 133 mg/dL (calc) — ABNORMAL HIGH
Non-HDL Cholesterol (Calc): 175 mg/dL (calc) — ABNORMAL HIGH (ref <130)
Total CHOL/HDL Ratio: 5.1 (calc) — ABNORMAL HIGH (ref <5.0)
Triglycerides: 271 mg/dL — ABNORMAL HIGH (ref <150)

## 2019-03-14 LAB — CBC WITH DIFFERENTIAL/PLATELET
Absolute Monocytes: 837 cells/uL (ref 200–950)
Basophils Absolute: 109 cells/uL (ref 0–200)
Basophils Relative: 1.2 %
Eosinophils Absolute: 100 cells/uL (ref 15–500)
Eosinophils Relative: 1.1 %
HCT: 42.2 % (ref 38.5–50.0)
Hemoglobin: 14.1 g/dL (ref 13.2–17.1)
Lymphs Abs: 2002 cells/uL (ref 850–3900)
MCH: 31.5 pg (ref 27.0–33.0)
MCHC: 33.4 g/dL (ref 32.0–36.0)
MCV: 94.4 fL (ref 80.0–100.0)
MPV: 10.3 fL (ref 7.5–12.5)
Monocytes Relative: 9.2 %
Neutro Abs: 6052 cells/uL (ref 1500–7800)
Neutrophils Relative %: 66.5 %
Platelets: 159 10*3/uL (ref 140–400)
RBC: 4.47 10*6/uL (ref 4.20–5.80)
RDW: 12.7 % (ref 11.0–15.0)
Total Lymphocyte: 22 %
WBC: 9.1 10*3/uL (ref 3.8–10.8)

## 2019-03-14 LAB — PSA: PSA: 5.6 ng/mL — ABNORMAL HIGH (ref <=4.0)

## 2019-03-14 LAB — MICROALBUMIN / CREATININE URINE RATIO
Creatinine, Urine: 36 mg/dL (ref 20–320)
Microalb, Ur: <0.2 mg/dL

## 2019-03-14 LAB — INSULIN, RANDOM: Insulin: 5.1 u[IU]/mL

## 2019-03-14 LAB — URINALYSIS, ROUTINE W REFLEX MICROSCOPIC
Bilirubin Urine: NEGATIVE
Glucose, UA: NEGATIVE
Hgb urine dipstick: NEGATIVE
Ketones, ur: NEGATIVE
Leukocytes,Ua: NEGATIVE
Nitrite: NEGATIVE
Protein, ur: NEGATIVE
Specific Gravity, Urine: 1.008 (ref 1.001–1.03)
pH: 5.5 (ref 5.0–8.0)

## 2019-03-14 LAB — VITAMIN D 25 HYDROXY (VIT D DEFICIENCY, FRACTURES): Vit D, 25-Hydroxy: 67 ng/mL (ref 30–100)

## 2019-03-14 LAB — HEMOGLOBIN A1C
Hgb A1c MFr Bld: 5.5 % of total Hgb (ref <5.7)
Mean Plasma Glucose: 111 (calc)
eAG (mmol/L): 6.2 (calc)

## 2019-03-14 LAB — MAGNESIUM: Magnesium: 2.1 mg/dL (ref 1.5–2.5)

## 2019-03-14 LAB — TSH: TSH: 2.1 mIU/L (ref 0.40–4.50)

## 2019-03-28 ENCOUNTER — Other Ambulatory Visit: Payer: Self-pay | Admitting: *Deleted

## 2019-03-28 DIAGNOSIS — J449 Chronic obstructive pulmonary disease, unspecified: Secondary | ICD-10-CM

## 2019-03-28 MED ORDER — BISOPROLOL-HYDROCHLOROTHIAZIDE 10-6.25 MG PO TABS: ORAL_TABLET | ORAL | 1 refills | Status: DC

## 2019-03-28 MED ORDER — TRELEGY ELLIPTA 100-62.5-25 MCG/INH IN AEPB: 1.0000 | INHALATION_SPRAY | Freq: Every day | RESPIRATORY_TRACT | 1 refills | Status: DC

## 2019-03-28 MED ORDER — ALBUTEROL SULFATE HFA 108 (90 BASE) MCG/ACT IN AERS: 1.0000 | INHALATION_SPRAY | Freq: Four times a day (QID) | RESPIRATORY_TRACT | 1 refills | Status: DC | PRN

## 2019-06-19 NOTE — Progress Notes (Deleted)
FOLLOW UP  Assessment and Plan:   Atherosclerosis of aorta Per CT 2020 Control blood pressure, cholesterol, glucose, increase exercise.   Essential hypertension At goal; continue medication Monitor blood pressure at home; call if consistently over 130/80 Continue DASH diet.   Reminder to go to the ER if any CP, SOB, nausea, dizziness, severe HA, changes vision/speech, left arm numbness and tingling and jaw pain.  ASHD s/p Stent Continue follow up with [name] Strongly recommended he reduce or stop smoking Control blood pressure, cholesterol, glucose, increase exercise.   COPD Advised to stop smoking, continue medications - improved with trellegy  Information given for financial assistance with trellegy   Vitamin D deficiency Continue supplementation; at goal  Check vitamin D level biannually or as needed  Mixed hyperlipidemia Continue follow up with [name]; goal LDL <70 per cardiology Continue medications:  rosuvastatin 40 mg, titrate for goal as tolerated Continue low cholesterol diet and exercise.  -     Lipid panel -     TSH  Medication management -     CBC with Differential/Platelet -     CMP/GFR  Tobacco use disorder 36 pack year hx; Discussed risks associated with tobacco use and advised to reduce or quit Patient is not ready to do so, but advised to consider strongly Will follow up at the next visit   Pulmonary nodules Follow up low dose CT is overdue, ordered but never completed *** Recommended he quit smoking  Thrombocytopenia ([place]) -     CBC with Differential/Platelet  Continue diet and meds as discussed. Further disposition pending results of labs. Discussed med's effects and SE's.   Over 30 minutes of exam, counseling, chart review, and critical decision making was performed.   Future Appointments  Date Time Provider [place]  06/20/2019  9:30 AM Michael Comber, Michael [name] [facility] None  10/03/2019  9:30 AM Unk Pinto, MD [facility]  None  12/12/2019  9:00 AM Michael Comber, Michael [name] [facility] None  04/21/2020  9:00 AM Unk Pinto, MD [facility] None    ----------------------------------------------------------------------------------------------------------------------  HPI 69 y.o. male  presents for 3 month follow up on hypertension, cholesterol, glucose management, weight and vitamin D deficiency.   He is previously established with [name] for BPH/elevated PSAs which have been stable- negative biopsy x3 in the past.   he currently continues to smoke 1 pack a day; discussed risks associated with smoking, patient is not ready to quit. He had low dose lung CT in 07/2018 which showed new pulm nodule recommended for 6 month follow up which was ordered but never completed ***  He has COPD and recently transitioned from [facility] to trellegy ellipta with perceived benefit.   BMI is There is no height or weight on file to calculate BMI., he has been working on diet and exercise. 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 He is recently established with 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 He has aortic atherosclerosis per CT  His blood pressure has been controlled at home, today their BP is    He does workout. He denies chest pain, shortness of breath, dizziness.   He is on cholesterol medication Rosuvastatin 40  mg twice weekly and denies myalgias. His cholesterol is not at goal. The cholesterol last visit was:   Lab Results  Component Value Date   CHOL 218 (H) 03/13/2019   HDL 43 03/13/2019   LDLCALC 133 (H) 03/13/2019   TRIG 271 (H) 03/13/2019   CHOLHDL 5.1 (H) 03/13/2019    He has been working on diet and exercise for glucose management, and denies  increased appetite, nausea, paresthesia of the feet, polydipsia, polyuria, visual disturbances and vomiting. Last A1C in the office was:  Lab Results  Component Value Date   HGBA1C 5.5 03/13/2019   Patient is on Vitamin D supplement.   Lab Results  Component Value Date   VD25OH 67 03/13/2019        Current Medications:  Current Outpatient Medications on File Prior to Visit  Medication Sig  . albuterol (VENTOLIN HFA) 108 (90 Base) MCG/ACT inhaler Inhale 1-2 puffs into the lungs every 6 (six) hours as needed for wheezing or shortness of breath.  Marland Kitchen aspirin EC 81 MG tablet Take 81 mg by mouth daily.  . bisoprolol-hydrochlorothiazide (ZIAC) 10-6.25 MG tablet Take 1 tablet Daily for BP  . cholecalciferol (VITAMIN D) 1000 UNITS tablet 2,000 Units. 4000 total a day   . Fluticasone-Umeclidin-Vilant (TRELEGY ELLIPTA) 100-62.5-25 MCG/INH AEPB Inhale 1 puff into the lungs daily.  . Multiple Vitamin (MULTIVITAMIN) tablet Take 1 tablet by mouth daily. Takes a vitamin pack daily  . rosuvastatin (CRESTOR) 40 MG tablet Take 1 tablet every other day as directed for cholesterol. (Patient not taking: Reported on 12/06/2018)  . saw palmetto 500 MG capsule Take 500 mg by mouth daily.   No current facility-administered medications on file prior to visit.     Allergies:  Allergies  Allergen Reactions  . Ace Inhibitors Cough  . Ciprofloxacin   . Fenofibrate     dizzy     Medical History:  Past Medical History:  Diagnosis Date  . BPH (benign prostatic hyperplasia)   . COPD (chronic obstructive pulmonary disease) ([place])    via CT lung  . GERD (gastroesophageal reflux disease)   . Hyperlipidemia   . Hypertension   . Hypogonadism male   . Myocardial infarction [facility]) 2005   Per patient no stent or PTCA, just medical treatment  . Prediabetes   . Vitamin D deficiency    Family history- Reviewed and unchanged Social history- Reviewed and unchanged   Review of Systems:  Review of Systems   Constitutional: Negative for malaise/fatigue and weight loss.  HENT: Negative for hearing loss and tinnitus.   Eyes: Negative for blurred vision and double vision.  Respiratory: Negative for cough, shortness of breath and wheezing.   Cardiovascular: Negative for chest pain, palpitations, orthopnea, claudication and leg swelling.  Gastrointestinal: Negative for abdominal pain, blood in stool, constipation, diarrhea, heartburn, melena, nausea and vomiting.  Genitourinary: Negative.   Musculoskeletal: Negative for joint pain and myalgias.  Skin: Negative for rash.  Neurological: Negative for dizziness, tingling, sensory change, weakness and headaches.  Endo/Heme/Allergies: Negative for polydipsia.  Psychiatric/Behavioral: Negative.   All other systems reviewed and are negative.     Physical Exam: There were no vitals taken for this visit. Wt Readings from Last 3 Encounters:  03/13/19 164 lb 12.8 oz (74.8 kg)  12/06/18 162 lb 12.8 oz (73.8 kg)  06/21/18 162 lb 9.6 oz (73.8 kg)   General Appearance: Well nourished, in no apparent distress. Eyes: PERRLA, EOMs, conjunctiva no swelling or erythema Sinuses: No Frontal/maxillary tenderness ENT/Mouth: Ext  aud canals clear, TMs without erythema, bulging. No erythema, swelling, or exudate on post pharynx.  Tonsils not swollen or erythematous. Hearing normal.  Neck: Supple, thyroid normal.  Respiratory: Respiratory effort normal, BS equal bilaterally without rales, rhonchi, wheezing or stridor.  Cardio: RRR with no MRGs. Brisk peripheral pulses without edema.  Abdomen: Soft, + BS.  Non tender, no guarding, rebound, hernias, masses. Lymphatics: Non tender without lymphadenopathy.  Musculoskeletal: Full ROM, 5/5 strength, Normal gait. 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  Skin: Warm, dry without rashes, lesions, ecchymosis.  Neuro: Cranial nerves intact. No cerebellar symptoms.  Psych: Awake and oriented X 3,  normal affect, Insight and Judgment appropriate.    Michael Ribas, Michael [name] 2:28 PM [facility]

## 2019-06-20 ENCOUNTER — Ambulatory Visit: Payer: Medicare Other | Admitting: Adult Health

## 2019-07-12 NOTE — Progress Notes (Deleted)
FOLLOW UP  Assessment and Plan:   Atherosclerosis of aorta Per CT 2020 Control blood pressure, cholesterol, glucose, increase exercise.   Essential hypertension At goal; continue medication Monitor blood pressure at home; call if consistently over 130/80 Continue DASH diet.   Reminder to go to the ER if any CP, SOB, nausea, dizziness, severe HA, changes vision/speech, left arm numbness and tingling and jaw pain.  ASHD s/p Stent Continue follow up with [name] Strongly recommended he reduce or stop smoking Control blood pressure, cholesterol, glucose, increase exercise.   COPD Advised to stop smoking, continue medications - improved with trellegy  Information given for financial assistance with trellegy   Vitamin D deficiency Continue supplementation; at goal  Check vitamin D level biannually or as needed  Mixed hyperlipidemia Continue follow up with [name]; goal LDL <70 per cardiology Continue medications:  rosuvastatin 40 mg, titrate for goal as tolerated Continue low cholesterol diet and exercise.  -     Lipid panel -     TSH  Medication management -     CBC with Differential/Platelet -     CMP/GFR  Tobacco use disorder 36 pack year hx; Discussed risks associated with tobacco use and advised to reduce or quit Patient is not ready to do so, but advised to consider strongly Will follow up at the next visit   Pulmonary nodules Follow up low dose CT is overdue, ordered but never completed *** Recommended he quit smoking  Continue diet and meds as discussed. Further disposition pending results of labs. Discussed med's effects and SE's.   Over 30 minutes of exam, counseling, chart review, and critical decision making was performed.   Future Appointments  Date Time Provider [place]  07/13/2019 10:45 AM Liane Comber, NP [facility] None  10/03/2019  9:30 AM Unk Pinto, MD [facility] None  12/12/2019  9:00 AM Liane Comber, NP [facility] None   04/21/2020  9:00 AM Unk Pinto, MD [facility] None    ----------------------------------------------------------------------------------------------------------------------  HPI 69 y.o. male  presents for 3 month follow up on hypertension, cholesterol, glucose management, weight and vitamin D deficiency.   he currently continues to smoke 1 pack a day; discussed risks associated with smoking, patient is not ready to quit. He had low dose lung CT in 07/2018 which showed new pulm nodule recommended for 6 month follow up which was ordered but never completed ***  He has COPD and recently transitioned from [facility] to trellegy ellipta with perceived benefit.   BMI is There is no height or weight on file to calculate BMI., he has been working on diet and exercise. 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 He is recently established with 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 He has aortic atherosclerosis per CT 06/2017 His blood pressure has been controlled at home, today their BP is     He does workout. He denies chest pain, shortness of breath, dizziness.   He is on cholesterol medication Rosuvastatin 40 mg twice weekly and denies myalgias. His cholesterol is not at goal. The cholesterol last visit was:   Lab Results  Component Value Date   CHOL 218 (H) 03/13/2019  HDL 43 03/13/2019   LDLCALC 133 (H) 03/13/2019   TRIG 271 (H) 03/13/2019   CHOLHDL 5.1 (H) 03/13/2019    He has been working on diet and exercise for glucose management, and denies increased appetite, nausea, paresthesia of the feet, polydipsia, polyuria, visual disturbances and vomiting. Last A1C in the office was:  Lab Results  Component Value Date    HGBA1C 5.5 03/13/2019   Patient is on Vitamin D supplement.   Lab Results  Component Value Date   VD25OH 52 03/13/2019     He is previously established with [name] for BPH/elevated PSAs which have been stable- negative biopsy x3 in the past.  He is on saw palmetto  Lab Results  Component Value Date   PSA 5.6 (H) 03/13/2019   PSA 6.9 (H) 02/28/2018   PSA 7.2 (H) 02/10/2017       Current Medications:  Current Outpatient Medications on File Prior to Visit  Medication Sig  . albuterol (VENTOLIN HFA) 108 (90 Base) MCG/ACT inhaler Inhale 1-2 puffs into the lungs every 6 (six) hours as needed for wheezing or shortness of breath.  Marland Kitchen aspirin EC 81 MG tablet Take 81 mg by mouth daily.  . bisoprolol-hydrochlorothiazide (ZIAC) 10-6.25 MG tablet Take 1 tablet Daily for BP  . cholecalciferol (VITAMIN D) 1000 UNITS tablet 2,000 Units. 4000 total a day   . Fluticasone-Umeclidin-Vilant (TRELEGY ELLIPTA) 100-62.5-25 MCG/INH AEPB Inhale 1 puff into the lungs daily.  . Multiple Vitamin (MULTIVITAMIN) tablet Take 1 tablet by mouth daily. Takes a vitamin pack daily  . rosuvastatin (CRESTOR) 40 MG tablet Take 1 tablet every other day as directed for cholesterol. (Patient not taking: Reported on 12/06/2018)  . saw palmetto 500 MG capsule Take 500 mg by mouth daily.   No current facility-administered medications on file prior to visit.     Allergies:  Allergies  Allergen Reactions  . Ace Inhibitors Cough  . Ciprofloxacin   . Fenofibrate     dizzy     Medical History:  Past Medical History:  Diagnosis Date  . BPH (benign prostatic hyperplasia)   . COPD (chronic obstructive pulmonary disease) ([place])    via CT lung  . GERD (gastroesophageal reflux disease)   . Hyperlipidemia   . Hypertension   . Hypogonadism male   . Myocardial infarction [facility]) 2005   Per patient no stent or PTCA, just medical treatment  . Prediabetes   . Vitamin D deficiency    Family history- Reviewed and  unchanged Social history- Reviewed and unchanged   Review of Systems:  Review of Systems  Constitutional: Negative for malaise/fatigue and weight loss.  HENT: Negative for hearing loss and tinnitus.   Eyes: Negative for blurred vision and double vision.  Respiratory: Negative for cough, shortness of breath and wheezing.   Cardiovascular: Negative for chest pain, palpitations, orthopnea, claudication and leg swelling.  Gastrointestinal: Negative for abdominal pain, blood in stool, constipation, diarrhea, heartburn, melena, nausea and vomiting.  Genitourinary: Negative.   Musculoskeletal: Negative for joint pain and myalgias.  Skin: Negative for rash.  Neurological: Negative for dizziness, tingling, sensory change, weakness and headaches.  Endo/Heme/Allergies: Negative for polydipsia.  Psychiatric/Behavioral: Negative.   All other systems reviewed and are negative.     Physical Exam: There were no vitals taken for this visit. Wt Readings from Last 3 Encounters:  03/13/19 164 lb 12.8 oz (74.8 kg)  12/06/18 162 lb 12.8 oz (73.8 kg)  06/21/18 162 lb 9.6 oz (73.8 kg)   General  Appearance: Well nourished, in no apparent distress. Eyes: PERRLA, EOMs, conjunctiva no swelling or erythema Sinuses: No Frontal/maxillary tenderness ENT/Mouth: Ext aud canals clear, TMs without erythema, bulging. No erythema, swelling, or exudate on post pharynx.  Tonsils not swollen or erythematous. Hearing normal.  Neck: Supple, thyroid normal.  Respiratory: Respiratory effort normal, BS equal bilaterally without rales, rhonchi, wheezing or stridor.  Cardio: RRR with no MRGs. Brisk peripheral pulses without edema.  Abdomen: Soft, + BS.  Non tender, no guarding, rebound, hernias, masses. Lymphatics: Non tender without lymphadenopathy.  Musculoskeletal: Full ROM, 5/5 strength, Normal gait. 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  Skin: Warm, dry without rashes, lesions,  ecchymosis.  Neuro: Cranial nerves intact. No cerebellar symptoms.  Psych: Awake and oriented X 3, normal affect, Insight and Judgment appropriate.    Izora Ribas, NP 10:04 AM [facility]

## 2019-07-13 ENCOUNTER — Ambulatory Visit: Payer: Medicare PPO | Admitting: Adult Health

## 2019-08-30 ENCOUNTER — Other Ambulatory Visit: Payer: Self-pay | Admitting: Internal Medicine

## 2019-08-30 DIAGNOSIS — J449 Chronic obstructive pulmonary disease, unspecified: Secondary | ICD-10-CM

## 2019-10-03 ENCOUNTER — Encounter: Payer: Self-pay | Admitting: Adult Health Nurse Practitioner

## 2019-10-03 ENCOUNTER — Ambulatory Visit (INDEPENDENT_AMBULATORY_CARE_PROVIDER_SITE_OTHER): Payer: Medicare PPO | Admitting: Adult Health Nurse Practitioner

## 2019-10-03 ENCOUNTER — Other Ambulatory Visit: Payer: Self-pay

## 2019-10-03 ENCOUNTER — Ambulatory Visit: Payer: Medicare Other | Admitting: Internal Medicine

## 2019-10-03 VITALS — BP 136/64 | HR 60 | Temp 96.9°F | Resp 16 | Wt 168.6 lb

## 2019-10-03 DIAGNOSIS — N138 Other obstructive and reflux uropathy: Secondary | ICD-10-CM

## 2019-10-03 DIAGNOSIS — J449 Chronic obstructive pulmonary disease, unspecified: Secondary | ICD-10-CM

## 2019-10-03 DIAGNOSIS — R911 Solitary pulmonary nodule: Secondary | ICD-10-CM

## 2019-10-03 DIAGNOSIS — I2510 Atherosclerotic heart disease of native coronary artery without angina pectoris: Secondary | ICD-10-CM

## 2019-10-03 DIAGNOSIS — N401 Enlarged prostate with lower urinary tract symptoms: Secondary | ICD-10-CM | POA: Diagnosis not present

## 2019-10-03 DIAGNOSIS — I700 Atherosclerosis of aorta: Secondary | ICD-10-CM | POA: Diagnosis not present

## 2019-10-03 DIAGNOSIS — E782 Mixed hyperlipidemia: Secondary | ICD-10-CM

## 2019-10-03 DIAGNOSIS — Z6822 Body mass index (BMI) 22.0-22.9, adult: Secondary | ICD-10-CM

## 2019-10-03 DIAGNOSIS — I10 Essential (primary) hypertension: Secondary | ICD-10-CM | POA: Diagnosis not present

## 2019-10-03 DIAGNOSIS — F17200 Nicotine dependence, unspecified, uncomplicated: Secondary | ICD-10-CM | POA: Diagnosis not present

## 2019-10-03 DIAGNOSIS — R7309 Other abnormal glucose: Secondary | ICD-10-CM | POA: Diagnosis not present

## 2019-10-03 DIAGNOSIS — Z79899 Other long term (current) drug therapy: Secondary | ICD-10-CM

## 2019-10-03 DIAGNOSIS — IMO0001 Reserved for inherently not codable concepts without codable children: Secondary | ICD-10-CM

## 2019-10-03 DIAGNOSIS — E559 Vitamin D deficiency, unspecified: Secondary | ICD-10-CM

## 2019-10-03 NOTE — Progress Notes (Signed)
FOLLOW UP 3 MONTH  Assessment / Plan   Michael [name] was seen today for follow-up.  Diagnoses and all orders for this visit:  Essential hypertension Continue current medications: ziac 10/6/25mg  daily in amn Monitor blood pressure at home; call if consistently over 130/80 Continue DASH diet.   Reminder to go to the ER if any CP, SOB, nausea, dizziness, severe HA, changes vision/speech, left arm numbness and tingling and jaw pain. -     CBC with Differential/Platelet -     COMPLETE METABOLIC PANEL WITH GFR  ASHD s/p Stent Control blood pressure, cholesterol, glucose, increase exercise.  Followed by cardiology  Hyperlipidemia, mixed Not taking medications at this time Discussed RYR and Fish Oil, consider adding Discussed dietary and exercise modifications Low fat diet -     Lipid panel  Aortic atherosclerosis (HCC) Imaging: CT 07/27/19 Control blood pressure, lipids and glucose Disscused lifestyle modifications, diet & exercise Continue to monitor  Abnormal glucose Discussed dietary and exercise modifications  Vitamin D deficiency Continue supplementation Taking Vitamin D 6,000 IU daily  Chronic obstructive pulmonary disease, unspecified COPD type (HCC) Continue medications:  Trelelgy 100/62.5/25 daily, rinse mouth after use Has Albuterol rescue inhaler, in date Advised to stop smoking  BPH with obstruction/lower urinary tract symptoms Doing well at this time Continue medications: saw palmetto Will continue to monitor Defer PSA this check  Smoker Discussed smoking cessation, benefits Discussed health risks of continued behavior Not ready to quit at this time Will continue to assess readiness  Lung nodule < 6cm on CT -     CT CHEST NODULE FOLLOW UP LOW DOSE W/O; Future Recommendation from CT 07/27/19 for 6 month follow up.  This was not completed related to COVID19 restrictions.  BMI 22.0-22.9 Discussed dietary and exercise modifications  Medication  management Continued   Over 30 minutes of face to face exam, counseling, chart review, and critical decision making was performed  Future Appointments  Date Time Provider [place]  12/12/2019  9:00 AM Liane Comber, NP [facility] None  04/21/2020  9:00 AM Unk Pinto, MD [facility] None    Subjective:  Michael [name] is a 69 y.o. male who presents  3 month follow up for HTN, ASCAD, HLD, DMII, COPD, and vitamin D Def.   He currently continues to smoke 1 pack a day; discussed risks associated with smoking, patient is not ready to quit. He had low dose lung CT screening on 07/2018 which showed a nodule lung-RADS 3S and was recommended 6 month follow up study which is scheduled for 01/30/2019 that was not completed.  He is overdue for screening, will place order today.  Reports he drink three bottles of water, 16oz a day.  Knows he should be drinking more than this.  He also reports some leg cramps that wake him in middle of night. He has COPD and he is using Trelegy daily.  Report he is rinsing his mouth out after use.  He has had to use the albuterol inhaler once in the past month.  This was related to increase in pollen.  BMI is Body mass index is 22.87 kg/m., he has been working on diet and exercise. 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 He is established with 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 His blood pressure has been controlled at home, today their BP is BP: 136/64 He does workout. He denies chest pain, shortness of breath, dizziness.   He is on cholesterol medication (rosuvastatin 40 mg twice weekly was recommended but never started, strong patient preference, historically has controlled  reasonably with diet) and denies myalgias. His cholesterol is not at goal. The cholesterol last visit was:   Lab Results  Component Value Date   CHOL 218 (H) 03/13/2019   HDL 43 03/13/2019   LDLCALC 133 (H) 03/13/2019   TRIG 271 (H) 03/13/2019   CHOLHDL 5.1 (H) 03/13/2019   He has been working on diet and exercise for glucose management, and denies increased appetite, nausea, paresthesia of the feet, polydipsia, polyuria and visual disturbances. Last A1C in the office was:  Lab Results  Component Value Date   HGBA1C 5.5 03/13/2019   Last GFR Lab Results  Component Value Date   GFRNONAA 88 03/13/2019   Patient is on Vitamin D supplement.   Lab Results  Component Value Date   VD25OH 8 03/13/2019       He is previously established with [name] for BPH/elevated PSAs which have been stable- negative biopsy x3 in the past. No longer follows with urology. Lab Results  Component Value Date   PSA 5.6 (H) 03/13/2019   PSA 6.9 (H) 02/28/2018   PSA 7.2 (H) 02/10/2017     Medication Review:   Current Outpatient Medications (Cardiovascular):  .  bisoprolol-hydrochlorothiazide (ZIAC) 10-6.25 MG tablet, Take 1 tablet Daily for BP .  rosuvastatin (CRESTOR) 40 MG tablet, Take 1 tablet every other day as directed for cholesterol. (Patient not taking: Reported on 12/06/2018)  Current Outpatient Medications (Respiratory):  .  albuterol (VENTOLIN HFA) 108 (90 Base) MCG/ACT inhaler, Inhale 1-2 puffs into the lungs every 6 (six) hours as needed for wheezing or shortness of breath. .  Fluticasone-Umeclidin-Vilant (TRELEGY ELLIPTA) 100-62.5-25 MCG/INH AEPB, Inhale 1 puff into the lungs daily.  Current Outpatient Medications (Analgesics):  .  aspirin EC 81 MG tablet, Take 81 mg by mouth daily.   Current Outpatient Medications (Other):  .  cholecalciferol (VITAMIN D) 1000 UNITS tablet, 2,000 Units in the morning, at noon, and at bedtime. 6000 total a day .  Multiple Vitamin (MULTIVITAMIN)  tablet, Take 1 tablet by mouth daily. Takes a vitamin pack daily .  saw palmetto 500 MG capsule, Take 500 mg by mouth daily.  Allergies: Allergies  Allergen Reactions  . Ace Inhibitors Cough  . Ciprofloxacin   . Fenofibrate     dizzy    Current Problems (verified) has BPH with obstruction/lower urinary tract symptoms; Vitamin D deficiency; COPD (chronic obstructive pulmonary disease) ([place]); Hypertension; Hyperlipidemia, mixed; Abnormal glucose; Smoker; ASHD s/p Stent; History of amputation of finger of left hand; Elevated PSA; Aortic atherosclerosis ([place]); FHx: heart disease; Fatigue; Pulmonary nodules; and History of colonic polyps (hyperplastic x 3 in 2014) on their problem list.  Screening Tests Immunization History  Administered Date(s) Administered  . PPD Test 12/25/2013  . Pneumococcal Conjugate-13 02/28/2018  . Pneumococcal-Unspecified 12/08/2010  . Tdap 12/08/2010    Preventative care: Last colonoscopy: 10/2012 Dr. Audie Box recommended 5 year follow up? Vs 10- will have GI review  Prior vaccinations: TD or Tdap: 2012 Influenza: declines  Pneumococcal: 2012 Prevnar13: 02/2018 Shingles/Zostavax: declines   Names of Other Physician/Practitioners you currently use: 1. [facility] here for primary care 2. Dr. Nicki Reaper, eye doctor, Due for 2021  3. , dentist, last visit remote, has top dentures  Patient Care Team: Unk Pinto, MD as PCP - General (Internal Medicine) Lowella Bandy, MD as Consulting Physician (Urology)  Surgical: He  has a past surgical history that includes Prostate surgery; Amputation finger / thumb (Left, 1991); Vasectomy; colonoscopy (N/A, 10/2012); Cardiac catheterization (N/A, 2005); and Cataract extraction, bilateral (Bilateral, 2014). Family His family history includes Cancer in his maternal aunt, maternal uncle, and maternal uncle; Cancer (age of onset: 78) in his father; Heart failure in his maternal  grandfather; Liver disease in his paternal grandfather; Other in his son; Pneumonia in his paternal grandmother; Stroke in his maternal grandmother; Stroke (age of onset: 3) in his son. Social history  He reports that he has been smoking cigarettes. He has a 36.00 pack-year smoking history. He has never used smokeless tobacco. He reports current alcohol use of about 5.0 standard drinks of alcohol per week. He reports that he does not use drugs.     Objective:   Today's Vitals   10/03/19 0950  BP: 136/64  Pulse: 60  Resp: 16  Temp: (!) 96.9 F (36.1 C)  SpO2: 98%  Weight: 168 lb 9.6 oz (76.5 kg)   Body mass index is 22.87 kg/m.  General appearance: alert, no distress, WD/WN, male HEENT: normocephalic, sclerae anicteric, TMs pearly, nares patent, no discharge or erythema, pharynx normal Oral cavity: MMM, no lesions Neck: supple, no lymphadenopathy, no thyromegaly, no masses Heart: RRR, normal S1, S2, no murmurs Lungs: CTA bilaterally, no wheezes, rhonchi, or rales Abdomen: +bs, soft, non tender, non distended, no masses, no hepatomegaly, no splenomegaly Musculoskeletal: nontender, no swelling, no obvious deformity Extremities: no edema, no cyanosis, no clubbing Pulses: 2+ symmetric, upper and lower extremities, normal cap refill Neurological: alert, oriented x 3, CN2-12 intact, strength normal upper extremities and lower extremities, sensation normal throughout, DTRs 2+ throughout, no cerebellar signs, gait normal Psychiatric: normal affect, behavior normal, pleasant    Garnet Sierras, NP [facility] 10/03/2019  10:45 AM

## 2019-10-03 NOTE — Patient Instructions (Signed)
  Today you had a visit with Garnet Sierras, DNP.  Below is a summary of your visit.   We will contact you in 1-3 days with your lab results.  Increase the amount of water you drink.  By 1-2 bottles a day at least.   You can take high dose fish oil to help with your cholesterol.  Fish oil 1,000mg , two tablets twice a day with food.  You can put these in the freezer if they leave a fishy aftertaste.  You can also try Red Yeast Rice supplement for cholesterol.  You will be contacted regarding the follow up CT scan.

## 2019-10-04 ENCOUNTER — Other Ambulatory Visit: Payer: Self-pay | Admitting: Adult Health Nurse Practitioner

## 2019-10-04 DIAGNOSIS — IMO0001 Reserved for inherently not codable concepts without codable children: Secondary | ICD-10-CM

## 2019-10-04 LAB — CBC WITH DIFFERENTIAL/PLATELET
Absolute Monocytes: 901 cells/uL (ref 200–950)
Basophils Absolute: 119 cells/uL (ref 0–200)
Basophils Relative: 1.4 %
Eosinophils Absolute: 111 cells/uL (ref 15–500)
Eosinophils Relative: 1.3 %
HCT: 40.3 % (ref 38.5–50.0)
Hemoglobin: 13.6 g/dL (ref 13.2–17.1)
Lymphs Abs: 1947 cells/uL (ref 850–3900)
MCH: 31.7 pg (ref 27.0–33.0)
MCHC: 33.7 g/dL (ref 32.0–36.0)
MCV: 93.9 fL (ref 80.0–100.0)
MPV: 10.2 fL (ref 7.5–12.5)
Monocytes Relative: 10.6 %
Neutro Abs: 5423 cells/uL (ref 1500–7800)
Neutrophils Relative %: 63.8 %
Platelets: 174 10*3/uL (ref 140–400)
RBC: 4.29 10*6/uL (ref 4.20–5.80)
RDW: 12.8 % (ref 11.0–15.0)
Total Lymphocyte: 22.9 %
WBC: 8.5 10*3/uL (ref 3.8–10.8)

## 2019-10-04 LAB — COMPLETE METABOLIC PANEL WITH GFR
AG Ratio: 2 (calc) (ref 1.0–2.5)
ALT: 14 U/L (ref 9–46)
AST: 18 U/L (ref 10–35)
Albumin: 4.1 g/dL (ref 3.6–5.1)
Alkaline phosphatase (APISO): 98 U/L (ref 35–144)
BUN: 15 mg/dL (ref 7–25)
CO2: 30 mmol/L (ref 20–32)
Calcium: 9.1 mg/dL (ref 8.6–10.3)
Chloride: 106 mmol/L (ref 98–110)
Creat: 0.91 mg/dL (ref 0.70–1.18)
GFR, Est African American: 99 mL/min/{1.73_m2} (ref >=60)
GFR, Est Non African American: 85 mL/min/{1.73_m2} (ref >=60)
Globulin: 2.1 g/dL (calc) (ref 1.9–3.7)
Glucose, Bld: 91 mg/dL (ref 65–99)
Potassium: 4.6 mmol/L (ref 3.5–5.3)
Sodium: 141 mmol/L (ref 135–146)
Total Bilirubin: 0.6 mg/dL (ref 0.2–1.2)
Total Protein: 6.2 g/dL (ref 6.1–8.1)

## 2019-10-04 LAB — LIPID PANEL
Cholesterol: 188 mg/dL (ref <200)
HDL: 45 mg/dL (ref >=40)
LDL Cholesterol (Calc): 113 mg/dL (calc) — ABNORMAL HIGH
Non-HDL Cholesterol (Calc): 143 mg/dL (calc) — ABNORMAL HIGH (ref <130)
Total CHOL/HDL Ratio: 4.2 (calc) (ref <5.0)
Triglycerides: 179 mg/dL — ABNORMAL HIGH (ref <150)

## 2019-10-23 ENCOUNTER — Ambulatory Visit
Admission: RE | Admit: 2019-10-23 | Discharge: 2019-10-23 | Disposition: A | Discharge status: Home / Self Care | Payer: Medicare PPO | Source: Ambulatory Visit | Attending: Adult Health Nurse Practitioner | Admitting: Adult Health Nurse Practitioner

## 2019-10-23 DIAGNOSIS — IMO0001 Reserved for inherently not codable concepts without codable children: Secondary | ICD-10-CM

## 2019-10-23 DIAGNOSIS — F17210 Nicotine dependence, cigarettes, uncomplicated: Secondary | ICD-10-CM | POA: Diagnosis not present

## 2019-12-10 NOTE — Progress Notes (Deleted)
MEDICARE ANNUAL WELLNESS VISIT AND FOLLOW UP Assessment:   Michael [name] was seen today for follow-up and medicare wellness.  Diagnoses and all orders for this visit:  Welcome to Medicare preventive visit  Aortic atherosclerosis ([place]) Per CT 10/2019 and numerous others Control blood pressure, cholesterol, glucose, increase exercise.   Chronic obstructive pulmonary disease, unspecified COPD type ([place]) Quit smoking, getting CT screening, improved with trellegy  Pulmonary nodules Follow up Ct is due June 2022 Advised to quit smoking  Smoker Discussed risks associated with tobacco use and advised to reduce or quit Patient is not ready to do so, but advised to consider strongly Will follow up at the next visit  Essential hypertension Continue medication Monitor blood pressure at home; call if consistently over 130/80 Continue DASH diet.   Reminder to go to the ER if any CP, SOB, nausea, dizziness, severe HA, changes vision/speech, left arm numbness and tingling and jaw pain.  ASHD s/p Stent Control blood pressure, cholesterol, glucose, increase exercise.  Followed by cardiology  BPH with obstruction/lower urinary tract symptoms Monitor annual PSA; symptoms controlled by saw palmetto   Elevated PSA Continue annual monitoring; had neg biopsy x 3 by Dr. Kellie Simmering  Vitamin D deficiency At goal at recent check; continue to recommend supplementation for goal of 60-100 Defer vitamin D level  Hyperlipidemia, mixed Patient declines medications, understands risk *** Historically fairly controlled with lifestyle Continue low cholesterol diet and exercise.  Check lipid panel.  -     Lipid panel -     TSH  BMI 22.0-22.9, adult ***  History of colon polyps 5 year follow up recommended by Dr. Audie Box due 12/2023   Over 30 minutes of exam, counseling, chart review, and critical decision making was performed  Future Appointments  Date Time Provider [place]  12/12/2019  9:00 AM  Liane Comber, NP [facility] None  04/21/2020  9:00 AM Unk Pinto, MD [facility] None     Plan:   During the course of the visit the patient was educated and counseled about appropriate screening and preventive services including:    Pneumococcal vaccine   Influenza vaccine  Prevnar 13  Td vaccine  Screening electrocardiogram  Colorectal cancer screening  Diabetes screening  Glaucoma screening  Nutrition counseling    Subjective:  Michael [name] is a 69 y.o. male who presents for Medicare Annual Wellness Visit and 3 month follow up for HTN, hyperlipidemia, glucose management, and vitamin D Def.   *** Can.[place] ***  Dx of fatigue? ***  he currently continues to smoke 1 pack a day; discussed risks associated with smoking, patient is not ready to quit.  He had low dose lung CT screening on 10/23/19 which showed numerous benign appearing nodules, recommended for 12 month follow up.   He has COPD and recently transitioned from [facility] to trellegy ellipta with perceived benefit but is expensive, paying $307 monthly. ***  BMI is There is no height or weight on file to calculate BMI., he has been working on diet and exercise. 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 He is established with 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  Recent CTs have demonstrated  aortic atherosclerosis and 3 vessel CAD.  His blood pressure has been controlled at home, today their BP is   He does workout. He denies chest pain, shortness of breath, dizziness.   He is on cholesterol medication (rosuvastatin 40 mg twice weekly was recommended but never started, strong patient preference, ***) and denies myalgias. His  cholesterol is not at goal. The cholesterol last visit was:   Lab Results  Component Value Date   CHOL 188 10/03/2019   HDL 45 10/03/2019   LDLCALC 113 (H) 10/03/2019   TRIG 179 (H) 10/03/2019   CHOLHDL 4.2 10/03/2019   He has been working on diet and exercise for glucose management, and denies increased appetite, nausea, paresthesia of the feet, polydipsia, polyuria and visual disturbances. Last A1C in the office was:  Lab Results  Component Value Date   HGBA1C 5.5 03/13/2019   Last GFR Lab Results  Component Value Date   GFRNONAA 85 10/03/2019   Patient is on Vitamin D supplement.   Lab Results  Component Value Date   VD25OH 92 03/13/2019       He is previously established with [name] *** for BPH/elevated PSAs which have been stable- negative biopsy x3 in the past, now taking saw palmetto Lab Results  Component Value Date   PSA 5.6 (H) 03/13/2019   PSA 6.9 (H) 02/28/2018   PSA 7.2 (H) 02/10/2017     Medication Review:   Current Outpatient Medications (Cardiovascular):  .  bisoprolol-hydrochlorothiazide (ZIAC) 10-6.25 MG tablet, Take 1 tablet Daily for BP .  rosuvastatin (CRESTOR) 40 MG tablet, Take 1 tablet every other day as directed for cholesterol. (Patient not taking: Reported on 12/06/2018)  Current Outpatient Medications (Respiratory):  .  albuterol (VENTOLIN HFA) 108 (90 Base) MCG/ACT inhaler, Inhale 1-2 puffs into the lungs every 6 (six) hours as needed for wheezing or shortness of breath. .  Fluticasone-Umeclidin-Vilant (TRELEGY ELLIPTA) 100-62.5-25 MCG/INH AEPB, Inhale 1 puff into the lungs daily.  Current Outpatient Medications (Analgesics):  .  aspirin EC 81 MG tablet, Take 81 mg by mouth daily.   Current Outpatient Medications (Other):  .  cholecalciferol (VITAMIN D) 1000 UNITS tablet, 2,000 Units in the morning, at noon, and at bedtime. 6000 total a day .  Multiple Vitamin (MULTIVITAMIN) tablet, Take 1 tablet by mouth daily. Takes a vitamin pack  daily .  saw palmetto 500 MG capsule, Take 500 mg by mouth daily.  Allergies: Allergies  Allergen Reactions  . Ace Inhibitors Cough  . Ciprofloxacin   . Fenofibrate     dizzy    Current Problems (verified) has BPH with obstruction/lower urinary tract symptoms; Vitamin D deficiency; COPD (chronic obstructive pulmonary disease) ([place]); Hypertension; Hyperlipidemia, mixed; Abnormal glucose; Smoker; ASHD s/p Stent; History of amputation of finger of left hand; Elevated PSA; Aortic atherosclerosis ([place]); FHx: heart disease; Fatigue; Pulmonary nodules; and History of colonic polyps (hyperplastic x 3 in 2014) on their problem list.  Screening Tests Immunization History  Administered Date(s) Administered  . PPD Test 12/25/2013  . Pneumococcal Conjugate-13 02/28/2018  . Pneumococcal-Unspecified 12/08/2010  . Tdap 12/08/2010    Preventative care: Last colonoscopy: 12/2018, polyps, Dr. Audie Box recommended 5 year follow up  Prior vaccinations: TD or Tdap: 2012 Influenza: declines  Pneumococcal: 2012 Prevnar13: 02/2018 Shingles/Zostavax: declines  Covid 19: ***  Names of Other Physician/Practitioners you currently use: 1. [facility] here for primary care 2. Dr. Nicki Reaper, eye doctor, last visit 2018  3. , dentist, last visit remote,  has top dentures  Patient Care Team: Unk Pinto, MD as PCP - General (Internal Medicine) Lowella Bandy, MD (Inactive) as Consulting Physician (Urology)  Surgical: He  has a past surgical history that includes Prostate surgery; Amputation finger / thumb (Left, 1991); Vasectomy; colonoscopy (N/A, 10/2012); Cardiac catheterization (N/A, 2005); and Cataract extraction, bilateral (Bilateral, 2014). Family His family history includes Cancer in his maternal aunt, maternal uncle, and maternal uncle; Cancer (age of onset: 33) in his father; Heart failure in his maternal grandfather; Liver disease in his paternal grandfather;  Other in his son; Pneumonia in his paternal grandmother; Stroke in his maternal grandmother; Stroke (age of onset: 41) in his son. Social history  He reports that he has been smoking cigarettes. He has a 36.00 pack-year smoking history. He has never used smokeless tobacco. He reports current alcohol use of about 5.0 standard drinks of alcohol per week. He reports that he does not use drugs.  MEDICARE WELLNESS OBJECTIVES: Physical activity:   Cardiac risk factors:   Depression/mood screen:   Depression screen [facility] 2/9 03/12/2019  Decreased Interest 0  Down, Depressed, Hopeless 0  PHQ - 2 Score 0    ADLs:  No flowsheet data found.   Cognitive Testing  Alert? Yes  Normal Appearance?Yes  Oriented to person? Yes  Place? Yes   Time? Yes  Recall of three objects?  Yes  Can perform simple calculations? Yes  Displays appropriate judgment?Yes  Can read the correct time from a watch face?Yes  EOL planning:     Objective:   There were no vitals filed for this visit. There is no height or weight on file to calculate BMI.  General appearance: alert, no distress, WD/WN, male HEENT: normocephalic, sclerae anicteric, TMs pearly, nares patent, no discharge or erythema, pharynx normal Oral cavity: MMM, no lesions Neck: supple, no lymphadenopathy, no thyromegaly, no masses Heart: RRR, normal S1, S2, no murmurs Lungs: CTA bilaterally, no wheezes, rhonchi, or rales Abdomen: +bs, soft, non tender, non distended, no masses, no hepatomegaly, no splenomegaly Musculoskeletal: nontender, no swelling, no obvious deformity *** amputated finger *** Extremities: no edema, no cyanosis, no clubbing Pulses: 2+ symmetric, upper and lower extremities, normal cap refill Neurological: alert, oriented x 3, CN2-12 intact, strength normal upper extremities and lower extremities, sensation normal throughout, DTRs 2+ throughout, no cerebellar signs, gait normal Psychiatric: normal affect, behavior normal, pleasant    Medicare Attestation I have personally reviewed: The patient's medical and social history Their use of alcohol, tobacco or illicit drugs Their current medications and supplements The patient's functional ability including ADLs,fall risks, home safety risks, cognitive, and hearing and visual impairment Diet and physical activities Evidence for depression or mood disorders  The patient's weight, height, BMI, and visual acuity have been recorded in the chart.  I have made referrals, counseling, and provided education to the patient based on review of the above and I have provided the patient with a written personalized care plan for preventive services.     Izora Ribas, NP   12/10/2019

## 2019-12-12 ENCOUNTER — Ambulatory Visit: Payer: 59 | Admitting: Adult Health

## 2020-01-02 NOTE — Progress Notes (Signed)
MEDICARE ANNUAL WELLNESS VISIT AND FOLLOW UP Assessment:   Sullivan was seen today for follow-up and medicare wellness.  Diagnoses and all orders for this visit:  Medicare preventive visit  Aortic atherosclerosis ([place]) Per CT 10/2019 and numerous others Control blood pressure, cholesterol, glucose, increase exercise.   Chronic obstructive pulmonary disease, unspecified COPD type ([place]) Quit smoking, getting CT screening, improved with trellegy  Pulmonary nodules Follow up Ct is due June 2022  Advised to quit smoking  Smoker Discussed risks associated with tobacco use and advised to reduce or quit Patient is ready to do so, plans to slowly taper; declines chantix, has failed patch, wants to try wellbutrin; 150 mg XR sent in  Will follow up at the next visit  Essential hypertension Continue medication Monitor blood pressure at home; call if consistently over 130/80 Continue DASH diet.   Reminder to go to the ER if any CP, SOB, nausea, dizziness, severe HA, changes vision/speech, left arm numbness and tingling and jaw pain.  ASHD s/p Stent Control blood pressure, cholesterol, glucose, increase exercise.  Followed by cardiology  BPH with obstruction/lower urinary tract symptoms Monitor annual PSA; symptoms controlled by saw palmetto   Elevated PSA Continue annual monitoring; had neg biopsy x 3 by Dr. Kellie Simmering  Vitamin D deficiency At goal at recent check; continue to recommend supplementation for goal of 60-100 Defer vitamin D level  Hyperlipidemia, mixed Patient declines all medications, understands risks after extended discussion Historically fairly controlled with lifestyle; recommended soluble fiber supplement Continue low cholesterol diet and exercise.  Check lipid panel.  -     Lipid panel -     TSH  BMI 22.0-22.9, adult  History of colon polyps 5 year follow up recommended by Dr. Audie Box due 12/2023   Over 30 minutes of exam, counseling, chart review, and critical  decision making was performed  Future Appointments  Date Time Provider [place]  04/21/2020  9:00 AM Unk Pinto, MD [facility] None     Plan:   During the course of the visit the patient was educated and counseled about appropriate screening and preventive services including:    Pneumococcal vaccine   Influenza vaccine  Prevnar 13  Td vaccine  Screening electrocardiogram  Colorectal cancer screening  Diabetes screening  Glaucoma screening  Nutrition counseling    Subjective:  KASYN ROLPH is a 69 y.o. male who presents for Medicare Annual Wellness Visit and 3 month follow up for HTN, hyperlipidemia, glucose management, and vitamin D Def.   he currently continues to smoke 1 pack a day; discussed risks associated with smoking, patient is not ready to quit, but does want to try to reduce, declines chantix, has tried patches, interested in wellbutrin.  He had low dose lung CT screening on 10/23/19 which showed numerous benign appearing nodules, recommended for 12 month follow up.   He has COPD and recently transitioned from [facility] to trellegy ellipta with excellent benefit.   BMI is Body mass index is 22.51 kg/m., he has been working on diet and exercise. 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 He is established with 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 Recent CTs have demonstrated  aortic atherosclerosis and 3 vessel CAD.  His blood pressure has been controlled at home, today their BP is BP: 130/74 He does workout. He denies chest pain, shortness of breath, dizziness.   He is on cholesterol medication (rosuvastatin 40 mg twice weekly was recommended but never started, strong patient  preference, long discussions, he understands risks and absolutely declines at this time) and denies myalgias. His cholesterol is not at goal. The cholesterol last visit was:   Lab Results  Component Value Date   CHOL 188 10/03/2019   HDL 45 10/03/2019   LDLCALC 113 (H) 10/03/2019   TRIG 179 (H) 10/03/2019   CHOLHDL 4.2 10/03/2019   He has been working on diet and exercise for glucose management, and denies increased appetite, nausea, paresthesia of the feet, polydipsia, polyuria and visual disturbances. Last A1C in the office was:  Lab Results  Component Value Date   HGBA1C 5.5 03/13/2019   Last GFR Lab Results  Component Value Date   GFRNONAA 85 10/03/2019   Patient is on Vitamin D supplement.   Lab Results  Component Value Date   VD25OH 66 03/13/2019       He is previously established with [name] for BPH/elevated PSAs which have been stable- negative biopsy x3 in the past, now taking saw palmetto Lab Results  Component Value Date   PSA 5.6 (H) 03/13/2019   PSA 6.9 (H) 02/28/2018   PSA 7.2 (H) 02/10/2017     Medication Review:   Current Outpatient Medications (Cardiovascular):  .  bisoprolol-hydrochlorothiazide (ZIAC) 10-6.25 MG tablet, Take 1 tablet Daily for BP  Current Outpatient Medications (Respiratory):  .  albuterol (VENTOLIN HFA) 108 (90 Base) MCG/ACT inhaler, Inhale 1-2 puffs into the lungs every 6 (six) hours as needed for wheezing or shortness of breath. .  Fluticasone-Umeclidin-Vilant (TRELEGY ELLIPTA) 100-62.5-25 MCG/INH AEPB, Inhale 1 puff into the lungs daily.  Current Outpatient Medications (Analgesics):  .  aspirin EC 81 MG tablet, Take 81 mg by mouth daily.   Current Outpatient Medications (Other):  .  cholecalciferol (VITAMIN D) 1000 UNITS tablet, 2,000 Units in the morning, at noon, and at bedtime. 6000 total a day .  Magnesium 250 MG TABS, Take by mouth every other day. .  Multiple Vitamin (MULTIVITAMIN) tablet, Take 1 tablet by mouth daily.  Takes a vitamin pack daily .  saw palmetto 500 MG capsule, Take 500 mg by mouth daily. Marland Kitchen  buPROPion (WELLBUTRIN XL) 150 MG 24 hr tablet, Take 1 tablet (150 mg total) by mouth every morning.  Allergies: Allergies  Allergen Reactions  . Ace Inhibitors Cough  . Ciprofloxacin   . Fenofibrate     dizzy    Current Problems (verified) has BPH with obstruction/lower urinary tract symptoms; Vitamin D deficiency; COPD (chronic obstructive pulmonary disease) ([place]); Hypertension; Hyperlipidemia, mixed; Abnormal glucose; Smoker; ASHD s/p Stent; History of amputation of finger of left hand; Elevated PSA; Aortic atherosclerosis ([place]); FHx: heart disease; Pulmonary nodules; History of colonic polyps (hyperplastic x 3 in 2014); and Statin declined on their problem list.  Screening Tests Immunization History  Administered Date(s) Administered  . PPD Test 12/25/2013  . Pneumococcal Conjugate-13 02/28/2018  . Pneumococcal-Unspecified 12/08/2010  . Tdap 12/08/2010    Preventative care: Last colonoscopy: 12/2018, polyps, Dr. Audie Box recommended 5 year follow up  Prior vaccinations TD or Tdap: 2012 Influenza: declines  Pneumococcal: 2012 Prevnar13: 02/2018 Shingles/Zostavax: declines  Covid 19: declines at this time, extended discussion  Names of Other Physician/Practitioners you currently use: 1. [facility] here for primary care 2. Dr. Nicki Reaper, eye doctor, last visit 2019, plans to reschedule  3. , dentist, last visit remote, has top dentures  Patient Care Team: Unk Pinto, MD as PCP - General (Internal Medicine) Lowella Bandy, MD (Inactive) as Consulting Physician (Urology)  Surgical: He  has a past surgical history that includes Prostate surgery; Amputation finger / thumb (Left, 1991); Vasectomy; colonoscopy (N/A, 10/2012); Cardiac catheterization (N/A, 2005); and Cataract extraction, bilateral (Bilateral, 2014). Family His family history includes  Cancer in his maternal aunt, maternal uncle, and maternal uncle; Cancer (age of onset: 4) in his father; Heart failure in his maternal grandfather; Liver disease in his paternal grandfather; Other in his son; Pneumonia in his paternal grandmother; Stroke in his maternal grandmother; Stroke (age of onset: 79) in his son. Social history  He reports that he has been smoking cigarettes. He started smoking about 56 years ago. He has a 45.00 pack-year smoking history. He has never used smokeless tobacco. He reports current alcohol use of about 5.0 standard drinks of alcohol per week. He reports that he does not use drugs.  MEDICARE WELLNESS OBJECTIVES: Physical activity: Current Exercise Habits: The patient has a physically strenuous job, but has no regular exercise apart from work., Exercise limited by: None identified Cardiac risk factors: Cardiac Risk Factors include: advanced age (>62men, >92 women);dyslipidemia;hypertension;male gender;smoking/ tobacco exposure Depression/mood screen:   Depression screen [facility] 2/9 01/03/2020  Decreased Interest 0  Down, Depressed, Hopeless 0  PHQ - 2 Score 0    ADLs:  In your present state of health, do you have any difficulty performing the following activities: 01/03/2020  Hearing? N  Vision? N  Difficulty concentrating or making decisions? N  Walking or climbing stairs? N  Dressing or bathing? N  Doing errands, shopping? N  Some recent data might be hidden     Cognitive Testing  Alert? Yes  Normal Appearance?Yes  Oriented to person? Yes  Place? Yes   Time? Yes  Recall of three objects?  Yes  Can perform simple calculations? Yes  Displays appropriate judgment?Yes  Can read the correct time from a watch face?Yes  EOL planning: Does Patient Have a Medical Advance Directive?: No Would patient like information on creating a medical advance directive?: Yes (MAU/Ambulatory/Procedural Areas - Information given)   Objective:   Today's Vitals   01/03/20  0908  BP: 130/74  Pulse: (!) 59  Temp: (!) 97 F (36.1 C)  SpO2: 97%  Weight: 166 lb (75.3 kg)  Height: 6' (1.829 m)   Body mass index is 22.51 kg/m.  General appearance: alert, no distress, WD/WN, male HEENT: normocephalic, sclerae anicteric, TMs pearly, nares patent, no discharge or erythema, pharynx normal Oral cavity: MMM, no lesions Neck: supple, no lymphadenopathy, no thyromegaly, no masses Heart: RRR, normal S1, S2, no murmurs Lungs: CTA bilaterally, no wheezes, rhonchi, or rales Abdomen: +bs, soft, non tender, non distended, no masses, no hepatomegaly, no splenomegaly Musculoskeletal: nontender, no swelling, left 2nd and 3rd digits with distal phlange amputated (remotely, well healed) Extremities: no edema, no cyanosis, no clubbing Pulses: 2+ symmetric, upper and lower extremities, normal cap refill Neurological: alert, oriented x 3, CN2-12 intact, strength normal upper extremities and lower extremities, sensation normal throughout, DTRs 2+ throughout, no cerebellar signs, gait normal Psychiatric: normal affect, behavior normal, pleasant   Medicare Attestation I have personally reviewed: The patient's medical and social history Their use of alcohol, tobacco or illicit drugs Their current medications and supplements  The patient's functional ability including ADLs,fall risks, home safety risks, cognitive, and hearing and visual impairment Diet and physical activities Evidence for depression or mood disorders  The patient's weight, height, BMI, and visual acuity have been recorded in the chart.  I have made referrals, counseling, and provided education to the patient based on review of the above and I have provided the patient with a written personalized care plan for preventive services.     Izora Ribas, NP   01/03/2020

## 2020-01-03 ENCOUNTER — Ambulatory Visit (INDEPENDENT_AMBULATORY_CARE_PROVIDER_SITE_OTHER): Payer: Medicare PPO | Admitting: Adult Health

## 2020-01-03 ENCOUNTER — Other Ambulatory Visit: Payer: Self-pay

## 2020-01-03 ENCOUNTER — Encounter: Payer: Self-pay | Admitting: Adult Health

## 2020-01-03 VITALS — BP 130/74 | HR 59 | Temp 97.0°F | Ht 72.0 in | Wt 166.0 lb

## 2020-01-03 DIAGNOSIS — Z6822 Body mass index (BMI) 22.0-22.9, adult: Secondary | ICD-10-CM

## 2020-01-03 DIAGNOSIS — R918 Other nonspecific abnormal finding of lung field: Secondary | ICD-10-CM

## 2020-01-03 DIAGNOSIS — I700 Atherosclerosis of aorta: Secondary | ICD-10-CM | POA: Diagnosis not present

## 2020-01-03 DIAGNOSIS — I10 Essential (primary) hypertension: Secondary | ICD-10-CM | POA: Diagnosis not present

## 2020-01-03 DIAGNOSIS — F17200 Nicotine dependence, unspecified, uncomplicated: Secondary | ICD-10-CM | POA: Diagnosis not present

## 2020-01-03 DIAGNOSIS — Z89022 Acquired absence of left finger(s): Secondary | ICD-10-CM

## 2020-01-03 DIAGNOSIS — N401 Enlarged prostate with lower urinary tract symptoms: Secondary | ICD-10-CM | POA: Diagnosis not present

## 2020-01-03 DIAGNOSIS — I2510 Atherosclerotic heart disease of native coronary artery without angina pectoris: Secondary | ICD-10-CM

## 2020-01-03 DIAGNOSIS — E559 Vitamin D deficiency, unspecified: Secondary | ICD-10-CM | POA: Diagnosis not present

## 2020-01-03 DIAGNOSIS — R7309 Other abnormal glucose: Secondary | ICD-10-CM | POA: Diagnosis not present

## 2020-01-03 DIAGNOSIS — E782 Mixed hyperlipidemia: Secondary | ICD-10-CM | POA: Diagnosis not present

## 2020-01-03 DIAGNOSIS — N138 Other obstructive and reflux uropathy: Secondary | ICD-10-CM

## 2020-01-03 DIAGNOSIS — R972 Elevated prostate specific antigen [PSA]: Secondary | ICD-10-CM

## 2020-01-03 DIAGNOSIS — Z86010 Personal history of colonic polyps: Secondary | ICD-10-CM

## 2020-01-03 DIAGNOSIS — J449 Chronic obstructive pulmonary disease, unspecified: Secondary | ICD-10-CM | POA: Diagnosis not present

## 2020-01-03 DIAGNOSIS — Z5320 Procedure and treatment not carried out because of patient's decision for unspecified reasons: Secondary | ICD-10-CM | POA: Insufficient documentation

## 2020-01-03 DIAGNOSIS — Z0000 Encounter for general adult medical examination without abnormal findings: Secondary | ICD-10-CM

## 2020-01-03 MED ORDER — BUPROPION HCL ER (XL) 150 MG PO TB24
150.0000 mg | ORAL_TABLET | ORAL | 1 refills | Status: DC
Start: 2020-01-03 — End: 2020-05-04

## 2020-01-03 NOTE — Patient Instructions (Addendum)
Michael [name] , Thank you for taking time to come for your Medicare Wellness Visit. I appreciate your ongoing commitment to your health goals. Please review the following plan we discussed and let me know if I can assist you in the future.   These are the goals we discussed: Goals    . LDL CALC < 70    . Quit Smoking       This is a list of the screening recommended for you and due dates:  Health Maintenance  Topic Date Due  . Flu Shot  12/23/2019  . COVID-19 Vaccine (1) 01/19/2020*  . Tetanus Vaccine  12/07/2020  . Colon Cancer Screening  01/12/2024  .  Hepatitis C: One time screening is recommended by Center for Disease Control  (CDC) for  adults born from [birth year] through 1966.   Completed  . Pneumonia vaccines  Discontinued  *Topic was postponed. The date shown is not the original due date.      SMOKING CESSATION  [employer]  [phone] for more information or for a free program for smoking cessation help.   You can call QUIT SMART 1-800-QUIT-NOW for free nicotine patches or replacement therapy- if they are out- keep calling  [facility] Can call for smoking cessation classes, [phone]  If you have a smart phone, please look up Smoke Free app, this will help you stay on track and give you information about money you have saved, life that you have gained back and a ton of more information.     ADVANTAGES OF QUITTING SMOKING  Within 20 minutes, blood pressure decreases. Your pulse is at normal level.  After 8 hours, carbon monoxide levels in the blood return to normal. Your oxygen level increases.  After 24 hours, the chance of having a heart attack starts to decrease. Your breath, hair, and body stop smelling like smoke.  After 48 hours, damaged nerve endings begin to recover. Your sense of taste and smell improve.  After 72 hours, the body is virtually free of nicotine. Your bronchial tubes relax and breathing becomes easier.  After  2 to 12 weeks, lungs can hold more air. Exercise becomes easier and circulation improves.  After 1 year, the risk of coronary heart disease is cut in half.  After 5 years, the risk of stroke falls to the same as a nonsmoker.  After 10 years, the risk of lung cancer is cut in half and the risk of other cancers decreases significantly.  After 15 years, the risk of coronary heart disease drops, usually to the level of a nonsmoker.  You will have extra money to spend on things other than cigarettes.     Bupropion sustained-release tablets (smoking cessation) What is this medicine? BUPROPION (byoo PROE pee on) is used to help people quit smoking. This medicine may be used for other purposes; ask your health care provider or pharmacist if you have questions. COMMON BRAND NAME(S): Buproban, Zyban What should I tell my health care provider before I take this medicine? They need to know if you have any of these conditions:  an eating disorder, such as anorexia or bulimia  bipolar disorder or psychosis  diabetes or high blood sugar, treated with medication  glaucoma  head injury or brain tumor  heart disease, previous heart attack, or irregular heart beat  high blood pressure  kidney or liver disease  seizures  suicidal thoughts or a previous suicide attempt  Tourette's syndrome  weight loss  an  unusual or allergic reaction to bupropion, other medicines, foods, dyes, or preservatives  breast-feeding  pregnant or trying to become pregnant How should I use this medicine? Take this medicine by mouth with a glass of water. Follow the directions on the prescription label. You can take it with or without food. If it upsets your stomach, take it with food. Do not cut, crush or chew this medicine. Take your medicine at regular intervals. If you take this medicine more than once a day, take your second dose at least 8 hours after you take your first dose. To limit difficulty in  sleeping, avoid taking this medicine at bedtime. Do not take your medicine more often than directed. Do not stop taking this medicine suddenly except upon the advice of your doctor. Stopping this medicine too quickly may cause serious side effects. A special MedGuide will be given to you by the pharmacist with each prescription and refill. Be sure to read this information carefully each time. Talk to your pediatrician regarding the use of this medicine in children. Special care may be needed. Overdosage: If you think you have taken too much of this medicine contact a poison control center or emergency room at once. NOTE: This medicine is only for you. Do not share this medicine with others. What if I miss a dose? If you miss a dose, skip the missed dose and take your next tablet at the regular time. There should be at least 8 hours between doses. Do not take double or extra doses. What may interact with this medicine? Do not take this medicine with any of the following medications:  linezolid  MAOIs like Azilect, Carbex, Eldepryl, Marplan, Nardil, and Parnate  methylene blue (injected into a vein)  other medicines that contain bupropion like Wellbutrin This medicine may also interact with the following medications:  alcohol  certain medicines for anxiety or sleep  certain medicines for blood pressure like metoprolol, propranolol  certain medicines for depression or psychotic disturbances  certain medicines for HIV or AIDS like efavirenz, lopinavir, nelfinavir, ritonavir  certain medicines for irregular heart beat like propafenone, flecainide  certain medicines for Parkinson's disease like amantadine, levodopa  certain medicines for seizures like carbamazepine, phenytoin, phenobarbital  cimetidine  clopidogrel  cyclophosphamide  digoxin  furazolidone  isoniazid  nicotine  orphenadrine  procarbazine  steroid medicines like prednisone or cortisone  stimulant  medicines for attention disorders, weight loss, or to stay awake  tamoxifen  theophylline  thiotepa  ticlopidine  tramadol  warfarin This list may not describe all possible interactions. Give your health care provider a list of all the medicines, herbs, non-prescription drugs, or dietary supplements you use. Also tell them if you smoke, drink alcohol, or use illegal drugs. Some items may interact with your medicine. What should I watch for while using this medicine? Visit your doctor or healthcare provider for regular checks on your progress. This medicine should be used together with a patient support program. It is important to participate in a behavioral program, counseling, or other support program that is recommended by your healthcare provider. This medicine may cause serious skin reactions. They can happen weeks to months after starting the medicine. Contact your healthcare provider right away if you notice fevers or flu-like symptoms with a rash. The rash may be red or purple and then turn into blisters or peeling of the skin. Or, you might notice a red rash with swelling of the face, lips or lymph nodes in your neck or  under your arms. Patients and their families should watch out for new or worsening thoughts of suicide or depression. Also watch out for sudden changes in feelings such as feeling anxious, agitated, panicky, irritable, hostile, aggressive, impulsive, severely restless, overly excited and hyperactive, or not being able to sleep. If this happens, especially at the beginning of treatment or after a change in dose, call your healthcare provider. Avoid alcoholic drinks while taking this medicine. Drinking excessive alcoholic beverages, using sleeping or anxiety medicines, or quickly stopping the use of these agents while taking this medicine may increase your risk for a seizure. Do not drive or use heavy machinery until you know how this medicine affects you. This medicine can  impair your ability to perform these tasks. Do not take this medicine close to bedtime. It may prevent you from sleeping. Your mouth may get dry. Chewing sugarless gum or sucking hard candy, and drinking plenty of water may help. Contact your doctor if the problem does not go away or is severe. Do not use nicotine patches or chewing gum without the advice of your doctor or healthcare provider while taking this medicine. You may need to have your blood pressure taken regularly if your doctor recommends that you use both nicotine and this medicine together. What side effects may I notice from receiving this medicine? Side effects that you should report to your doctor or health care professional as soon as possible:  allergic reactions like skin rash, itching or hives, swelling of the face, lips, or tongue  breathing problems  changes in vision  confusion  elevated mood, decreased need for sleep, racing thoughts, impulsive behavior  fast or irregular heartbeat  hallucinations, loss of contact with reality  increased blood pressure  rash, fever, and swollen lymph nodes  redness, blistering, peeling, or loosening of the skin, including inside the mouth  seizures  suicidal thoughts or other mood changes  unusually weak or tired  vomiting Side effects that usually do not require medical attention (report to your doctor or health care professional if they continue or are bothersome):  constipation  headache  loss of appetite  nausea  tremors  weight loss This list may not describe all possible side effects. Call your doctor for medical advice about side effects. You may report side effects to FDA at 1-800-FDA-1088. Where should I keep my medicine? Keep out of the reach of children. Store at room temperature between 20 and 25 degrees C (68 and 77 degrees F). Protect from light. Keep container tightly closed. Throw away any unused medicine after the expiration date. NOTE:  This sheet is a summary. It may not cover all possible information. If you have questions about this medicine, talk to your doctor, pharmacist, or health care provider.  2020 Elsevier/Gold Standard (2018-08-03 13:59:09)

## 2020-01-04 LAB — COMPLETE METABOLIC PANEL WITH GFR
AG Ratio: 1.7 (calc) (ref 1.0–2.5)
ALT: 13 U/L (ref 9–46)
AST: 20 U/L (ref 10–35)
Albumin: 4.1 g/dL (ref 3.6–5.1)
Alkaline phosphatase (APISO): 106 U/L (ref 35–144)
BUN: 16 mg/dL (ref 7–25)
CO2: 29 mmol/L (ref 20–32)
Calcium: 9.4 mg/dL (ref 8.6–10.3)
Chloride: 102 mmol/L (ref 98–110)
Creat: 0.96 mg/dL (ref 0.70–1.18)
GFR, Est African American: 92 mL/min/{1.73_m2} (ref >=60)
GFR, Est Non African American: 80 mL/min/{1.73_m2} (ref >=60)
Globulin: 2.4 g/dL (calc) (ref 1.9–3.7)
Glucose, Bld: 88 mg/dL (ref 65–99)
Potassium: 4.9 mmol/L (ref 3.5–5.3)
Sodium: 139 mmol/L (ref 135–146)
Total Bilirubin: 1.3 mg/dL — ABNORMAL HIGH (ref 0.2–1.2)
Total Protein: 6.5 g/dL (ref 6.1–8.1)

## 2020-01-04 LAB — CBC WITH DIFFERENTIAL/PLATELET
Absolute Monocytes: 736 cells/uL (ref 200–950)
Basophils Absolute: 120 cells/uL (ref 0–200)
Basophils Relative: 1.3 %
Eosinophils Absolute: 129 cells/uL (ref 15–500)
Eosinophils Relative: 1.4 %
HCT: 43.5 % (ref 38.5–50.0)
Hemoglobin: 14.7 g/dL (ref 13.2–17.1)
Lymphs Abs: 2539 cells/uL (ref 850–3900)
MCH: 31.7 pg (ref 27.0–33.0)
MCHC: 33.8 g/dL (ref 32.0–36.0)
MCV: 93.8 fL (ref 80.0–100.0)
MPV: 9.8 fL (ref 7.5–12.5)
Monocytes Relative: 8 %
Neutro Abs: 5676 cells/uL (ref 1500–7800)
Neutrophils Relative %: 61.7 %
Platelets: 169 10*3/uL (ref 140–400)
RBC: 4.64 10*6/uL (ref 4.20–5.80)
RDW: 12.9 % (ref 11.0–15.0)
Total Lymphocyte: 27.6 %
WBC: 9.2 10*3/uL (ref 3.8–10.8)

## 2020-01-04 LAB — LIPID PANEL
Cholesterol: 276 mg/dL — ABNORMAL HIGH (ref <200)
HDL: 42 mg/dL (ref >=40)
Non-HDL Cholesterol (Calc): 234 mg/dL (calc) — ABNORMAL HIGH (ref <130)
Total CHOL/HDL Ratio: 6.6 (calc) — ABNORMAL HIGH (ref <5.0)
Triglycerides: 403 mg/dL — ABNORMAL HIGH (ref <150)

## 2020-01-04 LAB — MAGNESIUM: Magnesium: 2.2 mg/dL (ref 1.5–2.5)

## 2020-01-04 LAB — TSH: TSH: 1.87 mIU/L (ref 0.40–4.50)

## 2020-04-18 ENCOUNTER — Encounter: Payer: Self-pay | Admitting: Internal Medicine

## 2020-04-18 NOTE — Progress Notes (Signed)
C  A  N  C  E  L  L  E   D  at        Appt         time                                                                                                                                                                                                                                                                        This very nice 69 y.o.  DWM presents for a Screening /Preventative Visit & comprehensive evaluation and management of multiple medical co-morbidities.  Patient has been followed for HTN, ASCVD, HLD, Prediabetes and Vitamin D Deficiency.      HTN predates since 2004. Patient's BP has been controlled at home.  Today's  .  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  Patient denies any cardiac symptoms as chest pain, palpitations, shortness of breath, dizziness or ankle swelling.      Patient alleges Statin Intolerance & his hyperlipidemia is not controlled with diet and Ezetimibe. Patient denies myalgias or other medication SE's. Last lipids were not at goal:  Lab Results  Component Value Date   CHOL 276 (H) 01/03/2020   HDL 42 01/03/2020   LDLCALC not calculated 01/03/2020   TRIG 403 (H) 01/03/2020   CHOLHDL 6.6 (H) 01/03/2020       Patient has hx/o prediabetes (A1c 5.7% /2010)and patient denies reactive hypoglycemic symptoms, visual blurring, diabetic polys or paresthesias. Last A1c was Normal & at goal:   Lab Results  Component Value Date   HGBA1C 5.5 03/13/2019        Finally, patient has history of Vitamin D Deficiency ("25" /2009)and last vitamin D was at goal:   Lab Results  Component Value Date   VD25OH 67 03/13/2019    Current Outpatient  Medications on File Prior to Visit  Medication Sig  . Albuterol HFA  inhaler Inhale 1-2 puffs every 6 hrs as needed  . aspirin EC 81 MG tablet Take 81 mg by mouth daily.  . bisoprolol-hctyz 10-6.25  Take 1 tablet Daily for BP  . buPROPion-XL 150  MG  Take 1 tablet  every morning.  Marland Kitchen VITAMIN D1000 U  6,000 total a day  . TRELEGY ELLIPTA 100-62.5-25 Inhale 1 puff into the lungs daily.  . Magnesium 250 MG Take by mouth every other day.  . Multiple Vitamin Takes a vitamin pack daily  . Saw Palmetto 500 mg Take  daily.    Allergies  Allergen Reactions  . Ace Inhibitors Cough  . Ciprofloxacin   . Fenofibrate     dizzy    Past Medical History:  Diagnosis Date  . BPH (benign prostatic hyperplasia)   . COPD (chronic obstructive pulmonary disease) ([place])    via CT lung  . GERD (gastroesophageal reflux disease)   . Hyperlipidemia   . Hypertension   . Hypogonadism male   . Myocardial infarction ([place]) 2005  . Prediabetes   . Vitamin D deficiency    Health Maintenance  Topic Date Due  . COVID-19 Vaccine (1) Never done  . INFLUENZA VACCINE  08/21/2020 (Originally 12/23/2019)  . TETANUS/TDAP  12/07/2020  . COLONOSCOPY  01/12/2024  . Hepatitis C Screening  Completed  . PNA vac Low Risk Adult  Discontinued   Immunization History  Administered Date(s) Administered  . PPD Test 12/25/2013  . Pneumococcal Conjugate-13 02/28/2018  . Pneumococcal-Unspecified 12/08/2010  . Tdap 12/08/2010   Last Colon - 11/10/2012 Dr Unknown Foley recc 5 yr f/u due June /July 2019 - Overdue  Past Surgical History:  Procedure Laterality Date  . AMPUTATION FINGER / THUMB Left 1991   distal index  . CARDIAC CATHETERIZATION N/A 2005   Per patient no stent or PTCA, just medical treatment  . CATARACT EXTRACTION, BILATERAL Bilateral 2014   Dr. Talbert Forest   . colonoscopy N/A 10/2012   Neg and recc f/u Cololnoscopy in 5 yeary - Dr Nicki Reaper [place] - Gastroenterologist  . PROSTATE SURGERY     Biopsy X 3  . VASECTOMY      Family History  Problem Relation Age of Onset  . Cancer Father 88       lymphoma  . Cancer Maternal Aunt        Lung  . Cancer Maternal Uncle        Lung  . Cancer Maternal Uncle        Lung  . Stroke Maternal Grandmother   . Heart failure Maternal Grandfather   . Pneumonia Paternal Grandmother   . Liver disease Paternal Grandfather   . Stroke Son 83  . Other Son        internal intestinal hemorage   Social History   Socioeconomic History  . Marital status: Divorced  . Number of children: 1 son  Occupational History  . Draftsman  Tobacco Use  . Smoking status: Current Every Day Smoker    Packs/day: 1.00    Years: 45.00    Pack years: 45.00    Types: Cigarettes    Start date: 7  . Smokeless tobacco: Never Used  Substance and Sexual Activity  . Alcohol use: Yes    Alcohol/week: 5.0 standard drinks    Types: 5 Cans of beer per week  . Drug use: No  . Sexual activity: Not on file  Social History Narrative   Epworth Sleepiness score 5   Assessment and Plan  1. Essential hypertension  - EKG 12-Lead - [place], RETROPERITNL ABD,  LTD - Urinalysis, Routine w reflex microscopic - Microalbumin / creatinine urine ratio - CBC with Differential/Platelet - COMPLETE METABOLIC PANEL WITH GFR - Magnesium - TSH  2. Hyperlipidemia, mixed  - EKG 12-Lead - [place], RETROPERITNL ABD,  LTD - Lipid panel - TSH  3. Abnormal glucose  - EKG 12-Lead - [place], RETROPERITNL ABD,  LTD - Hemoglobin A1c - Insulin, random  4. Vitamin D deficiency  - VITAMIN D 25 Hydroxy  5. Aortic atherosclerosis (H[facility])  - EKG 12-Lead - [place], RETROPERITNL ABD,  LTD - Lipid panel  6. ASHD s/p Stent  - EKG 12-Lead - [place], RETROPERITNL ABD,  LTD  7. BPH with obstruction/lower urinary tract symptoms  - PSA  8. Prediabetes   9. Prostate cancer screening  - PSA  10. Screening for colon cancer   11. Screening for colorectal cancer  - POC Hemoccult Bld/Stl   12. Screening for ischemic  heart disease  - EKG 12-Lead  13. FHx: heart disease  - EKG 12-Lead - [place], RETROPERITNL ABD,  LTD  14. Smoker  - EKG 12-Lead - [place], RETROPERITNL ABD,  LTD  15. Screening for AAA (aortic abdominal aneurysm)  - [place], RETROPERITNL ABD,  LTD  16. Chronic obstructive pulmonary disease  17. Medication management  - Urinalysis, Routine w reflex microscopic - Microalbumin / creatinine urine ratio - CBC with Differential/Platelet - COMPLETE METABOLIC PANEL WITH GFR - Magnesium - Lipid panel - TSH - Hemoglobin A1c - Insulin, random - VITAMIN D 25 Hydroxy        Patient was counseled in prudent diet, weight control to achieve/maintain BMI less than 25, BP monitoring, regular exercise and medications as discussed.  Discussed med effects and SE's. Routine screening labs and tests as requested with regular follow-up as recommended. Over 40 minutes of exam, counseling, chart review and high complex critical decision making was performed   Kirtland Bouchard, MD

## 2020-04-21 ENCOUNTER — Ambulatory Visit (INDEPENDENT_AMBULATORY_CARE_PROVIDER_SITE_OTHER): Payer: Medicare Other | Admitting: Internal Medicine

## 2020-04-21 DIAGNOSIS — Z136 Encounter for screening for cardiovascular disorders: Secondary | ICD-10-CM

## 2020-04-21 DIAGNOSIS — R7309 Other abnormal glucose: Secondary | ICD-10-CM

## 2020-04-21 DIAGNOSIS — Z79899 Other long term (current) drug therapy: Secondary | ICD-10-CM

## 2020-04-21 DIAGNOSIS — Z1211 Encounter for screening for malignant neoplasm of colon: Secondary | ICD-10-CM

## 2020-04-21 DIAGNOSIS — I10 Essential (primary) hypertension: Secondary | ICD-10-CM

## 2020-04-21 DIAGNOSIS — Z8249 Family history of ischemic heart disease and other diseases of the circulatory system: Secondary | ICD-10-CM

## 2020-04-21 DIAGNOSIS — R7303 Prediabetes: Secondary | ICD-10-CM

## 2020-04-21 DIAGNOSIS — F17200 Nicotine dependence, unspecified, uncomplicated: Secondary | ICD-10-CM

## 2020-04-21 DIAGNOSIS — J449 Chronic obstructive pulmonary disease, unspecified: Secondary | ICD-10-CM

## 2020-04-21 DIAGNOSIS — R69 Illness, unspecified: Secondary | ICD-10-CM

## 2020-04-21 DIAGNOSIS — R972 Elevated prostate specific antigen [PSA]: Secondary | ICD-10-CM

## 2020-04-21 DIAGNOSIS — Z125 Encounter for screening for malignant neoplasm of prostate: Secondary | ICD-10-CM

## 2020-04-21 DIAGNOSIS — E559 Vitamin D deficiency, unspecified: Secondary | ICD-10-CM

## 2020-04-21 DIAGNOSIS — N401 Enlarged prostate with lower urinary tract symptoms: Secondary | ICD-10-CM

## 2020-04-21 DIAGNOSIS — N138 Other obstructive and reflux uropathy: Secondary | ICD-10-CM

## 2020-04-21 DIAGNOSIS — I700 Atherosclerosis of aorta: Secondary | ICD-10-CM

## 2020-04-21 DIAGNOSIS — E782 Mixed hyperlipidemia: Secondary | ICD-10-CM

## 2020-04-21 DIAGNOSIS — I2510 Atherosclerotic heart disease of native coronary artery without angina pectoris: Secondary | ICD-10-CM

## 2020-04-30 NOTE — Progress Notes (Signed)
Annual  Screening/Preventative Visit  & Comprehensive Evaluation & Examination      This very nice 69 y.o.  DWM presents for a Screening /Preventative Visit & comprehensive evaluation and management of multiple medical co-morbidities.  Patient has been followed for HTN, HLD, Prediabetes and Vitamin D Deficiency. Patient has Aortic atherosclerosis demonstrated on LD screening Chest CT on 10/23/2019.      HTN predates since 2004.   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  Patient's BP has been controlled at home.  Today's BP is at goal -  128/78. Patient denies any cardiac symptoms as chest pain, palpitations, shortness of breath, dizziness or ankle swelling.      Patient alleges Statin Intolerance and lipids are not  controlled with diet and Ezetimibe.Patient denies myalgias or other medication SE's. Last lipids were not at goal: Lab Results  Component Value Date   CHOL 276 (H) 01/03/2020   HDL 42 01/03/2020   LDLCALC not calculated 01/03/2020     Comment:   TRIG 403 (H) 01/03/2020   CHOLHDL 6.6 (H) 01/03/2020       Patient has hx/o prediabetes  (A1c 5.7% /2010)and patient denies reactive hypoglycemic symptoms, visual blurring, diabetic polys or paresthesias. Last A1c was at goal:   Lab Results  Component Value Date   HGBA1C 5.5 03/13/2019        Finally, patient has history of Vitamin D Deficiency ("25"/2009) and last vitamin D was at goal:    Lab Results  Component Value Date   VD25OH 67 03/13/2019    Current Outpatient Medications on File Prior to Visit  Medication Sig   Albuterol HFA inhaler Inhale 1-2 puffs  Every 4 hours as needed    aspirin EC 81 MG tablet Take 1 tablet daily.   bisoprolol-hyctz 10-6.25 MG tablet Take 1 tablet Daily for BP   buPROPion-XL 150 MG - Take 1 tablet every morning.   VITAMIN D 1000 UNITS  6000 Units total a day   TRELEGY ELLIPTA) Inhale 1 puff into the lungs daily.    Magnesium 250 MG TABS Take  every other day.   Multiple Vitamin tablet Takes a vitamin pack daily   saw palmetto 500 MG capsule Take  daily.     Allergies  Allergen Reactions   Ace Inhibitors Cough   Ciprofloxacin    Fenofibrate     dizzy    Past Medical History:  Diagnosis Date   BPH (benign prostatic hyperplasia)    COPD (chronic obstructive pulmonary disease) (HCC)    via CT lung   GERD (gastroesophageal reflux disease)    Hyperlipidemia    Hypertension    Hypogonadism male    Myocardial infarction [facility]) 2005   Per patient no stent or PTCA, just medical treatment   Prediabetes    Vitamin D deficiency    Health Maintenance  Topic Date Due   COVID-19 Vaccine (1) Never done   INFLUENZA VACCINE  08/21/2020 (Originally 12/23/2019)   TETANUS/TDAP  12/07/2020   COLONOSCOPY  01/12/2024   Hepatitis C Screening  Completed   PNA vac Low Risk Adult  Discontinued   Immunization History  Administered Date(s) Administered   PPD Test 12/25/2013   Pneumococcal Conjugate-13 02/28/2018   Pneumococcal-Unspecified 12/08/2010   Tdap 12/08/2010   Last Colon - 01/12/2019 Dr Nicki Reaper [facility] - recc 5 year f/u due Aug / Sept 2025  Past Surgical History:  Procedure Laterality  Date   AMPUTATION FINGER / THUMB Left 1991   distal index   CARDIAC CATHETERIZATION N/A 2005   Per patient no stent or PTCA, just medical treatment   CATARACT EXTRACTION, BILATERAL Bilateral 2014   Dr. Talbert Forest    colonoscopy N/A 10/2012   Neg and recc f/u Colonoscopy in 5 yeary - Dr Nicki Reaper [place] - Gastroenterologist   PROSTATE SURGERY     Biopsy X 3   VASECTOMY      Social History   Socioeconomic History   Marital status: Divorced   Number of children: 2 sons  Occupational History   Not on file  Tobacco Use   Smoking status: Current Every Day Smoker    Packs/day: 1.00    Years: 45.00    Pack years: 45.00    Types: Cigarettes    Start date: 1965    Smokeless tobacco: Never Used  Substance and Sexual Activity   Alcohol use: Yes    Alcohol/week: 5.0 standard drinks    Types: 5 Cans of beer per week   Drug use: No   Sexual activity: Not on file    ROS Constitutional: Denies fever, chills, weight loss/gain, headaches, insomnia,  night sweats or change in appetite. Does c/o fatigue. Eyes: Denies redness, blurred vision, diplopia, discharge, itchy or watery eyes.  ENT: Denies discharge, congestion, post nasal drip, epistaxis, sore throat, earache, hearing loss, dental pain, Tinnitus, Vertigo, Sinus pain or snoring.  Cardio: Denies chest pain, palpitations, irregular heartbeat, syncope, dyspnea, diaphoresis, orthopnea, PND, claudication or edema Respiratory: denies cough, dyspnea, DOE, pleurisy, hoarseness, laryngitis or wheezing.  Gastrointestinal: Denies dysphagia, heartburn, reflux, water brash, pain, cramps, nausea, vomiting, bloating, diarrhea, constipation, hematemesis, melena, hematochezia, jaundice or hemorrhoids Genitourinary: Denies dysuria, frequency, urgency, nocturia, hesitancy, discharge, hematuria or flank pain Musculoskeletal: Denies arthralgia, myalgia, stiffness, Jt. Swelling, pain, limp or strain/sprain. Denies Falls. Skin: Denies puritis, rash, hives, warts, acne, eczema or change in skin lesion Neuro: No weakness, tremor, incoordination, spasms, paresthesia or pain Psychiatric: Denies confusion, memory loss or sensory loss. Denies Depression. Endocrine: Denies change in weight, skin, hair change, nocturia, and paresthesia, diabetic polys, visual blurring or hyper / hypo glycemic episodes.  Heme/Lymph: No excessive bleeding, bruising or enlarged lymph nodes.  Physical Exam  BP 128/78    Pulse 71    Temp (!) 97.2 F (36.2 C)    Resp 16    Ht 6' (1.829 m)    Wt 162 lb 12.8 oz (73.8 kg)    SpO2 96%    BMI 22.08 kg/m   General Appearance: Well nourished and well groomed and in no apparent distress.  Eyes: PERRLA,  EOMs, conjunctiva no swelling or erythema, normal fundi and vessels. Sinuses: No frontal/maxillary tenderness ENT/Mouth: EACs patent / TMs  nl. Nares clear without erythema, swelling, mucoid exudates. Oral hygiene is good. No erythema, swelling, or exudate. Tongue normal, non-obstructing. Tonsils not swollen or erythematous. Hearing normal.  Neck: Supple, thyroid not palpable. No bruits, nodes or JVD. Respiratory: Respiratory effort normal.  BS equal and clear bilateral without rales, rhonci, wheezing or stridor. Cardio: Heart sounds are normal with regular rate and rhythm and no murmurs, rubs or gallops. Peripheral pulses are normal and equal bilaterally without edema. No aortic or femoral bruits. Chest: symmetric with normal excursions and percussion.  Abdomen: Soft, with Nl bowel sounds. Nontender, no guarding, rebound, hernias, masses, or organomegaly.  Lymphatics: Non tender without lymphadenopathy.  Musculoskeletal: Full ROM all peripheral extremities, joint stability, 5/5 strength, and normal gait. Skin:  Warm and dry without rashes, lesions, cyanosis, clubbing or  ecchymosis.  Neuro: Cranial nerves intact, reflexes equal bilaterally. Normal muscle tone, no cerebellar symptoms. Sensation intact.  Pysch: Alert and oriented X 3 with normal affect, insight and judgment appropriate.   Assessment and Plan  1. Annual Preventative/Screening Exam    2. Essential hypertension  - EKG 12-Lead - [place], RETROPERITNL ABD,  LTD - Urinalysis, Routine w reflex microscopic - Microalbumin / creatinine urine ratio - CBC with Differential/Platelet - COMPLETE METABOLIC PANEL WITH GFR - Magnesium - TSH  3. Hyperlipidemia, mixed  - EKG 12-Lead - [place], RETROPERITNL ABD,  LTD - Lipid panel - TSH - Hemoglobin A1c - Insulin, random  4. Abnormal glucose  - EKG 12-Lead - [place], RETROPERITNL ABD,  LTD  5. Vitamin D deficiency  - VITAMIN D 25 Hydroxy   6. Aortic atherosclerosis (HCC)  - EKG  12-Lead - [place], RETROPERITNL ABD,  LTD  7. ASHD s/p Stent  - EKG 12-Lead  8. BPH with obstruction/lower urinary tract symptoms  - PSA  9. Chronic obstructive pulmonary disease ([place])   10. Prostate cancer screening  - PSA  11. Screening for colon cancer  - POC Hemoccult Bld/Stl   12. Screening for ischemic heart disease  - EKG 12-Lead  13. FHx: heart disease  - EKG 12-Lead - [place], RETROPERITNL ABD,  LTD  14. Smoker  - EKG 12-Lead - [place], RETROPERITNL ABD,  LTD  15. Screening for AAA (aortic abdominal aneurysm)  - [place], RETROPERITNL ABD,  LTD  16. Medication management  - Urinalysis, Routine w reflex microscopic - Microalbumin / creatinine urine ratio - CBC with Differential/Platelet - COMPLETE METABOLIC PANEL WITH GFR - Magnesium - Lipid panel - TSH - Hemoglobin A1c - Insulin, random - VITAMIN D 25 Hydroxy         Patient was counseled in prudent diet, weight control to achieve/maintain BMI less than 25, BP monitoring, regular exercise and medications as discussed.  Discussed med effects and SE's. Routine screening labs and tests as requested with regular follow-up as recommended. Over 40 minutes of exam, counseling, chart review and high complex critical decision making was performed   Kirtland Bouchard, MD

## 2020-04-30 NOTE — Patient Instructions (Signed)

## 2020-05-01 ENCOUNTER — Encounter: Payer: Self-pay | Admitting: Internal Medicine

## 2020-05-01 ENCOUNTER — Ambulatory Visit (INDEPENDENT_AMBULATORY_CARE_PROVIDER_SITE_OTHER): Payer: Medicare PPO | Admitting: Internal Medicine

## 2020-05-01 ENCOUNTER — Other Ambulatory Visit: Payer: Self-pay

## 2020-05-01 VITALS — BP 128/78 | HR 71 | Temp 97.2°F | Resp 16 | Ht 72.0 in | Wt 162.8 lb

## 2020-05-01 DIAGNOSIS — E782 Mixed hyperlipidemia: Secondary | ICD-10-CM | POA: Diagnosis not present

## 2020-05-01 DIAGNOSIS — R7309 Other abnormal glucose: Secondary | ICD-10-CM

## 2020-05-01 DIAGNOSIS — Z0001 Encounter for general adult medical examination with abnormal findings: Secondary | ICD-10-CM

## 2020-05-01 DIAGNOSIS — I700 Atherosclerosis of aorta: Secondary | ICD-10-CM

## 2020-05-01 DIAGNOSIS — F17200 Nicotine dependence, unspecified, uncomplicated: Secondary | ICD-10-CM

## 2020-05-01 DIAGNOSIS — Z136 Encounter for screening for cardiovascular disorders: Secondary | ICD-10-CM | POA: Diagnosis not present

## 2020-05-01 DIAGNOSIS — Z125 Encounter for screening for malignant neoplasm of prostate: Secondary | ICD-10-CM

## 2020-05-01 DIAGNOSIS — E559 Vitamin D deficiency, unspecified: Secondary | ICD-10-CM | POA: Diagnosis not present

## 2020-05-01 DIAGNOSIS — N138 Other obstructive and reflux uropathy: Secondary | ICD-10-CM

## 2020-05-01 DIAGNOSIS — N401 Enlarged prostate with lower urinary tract symptoms: Secondary | ICD-10-CM

## 2020-05-01 DIAGNOSIS — Z79899 Other long term (current) drug therapy: Secondary | ICD-10-CM

## 2020-05-01 DIAGNOSIS — Z8249 Family history of ischemic heart disease and other diseases of the circulatory system: Secondary | ICD-10-CM

## 2020-05-01 DIAGNOSIS — Z0000 Encounter for general adult medical examination without abnormal findings: Secondary | ICD-10-CM

## 2020-05-01 DIAGNOSIS — I2510 Atherosclerotic heart disease of native coronary artery without angina pectoris: Secondary | ICD-10-CM

## 2020-05-01 DIAGNOSIS — I10 Essential (primary) hypertension: Secondary | ICD-10-CM | POA: Diagnosis not present

## 2020-05-01 DIAGNOSIS — J449 Chronic obstructive pulmonary disease, unspecified: Secondary | ICD-10-CM

## 2020-05-01 DIAGNOSIS — Z1211 Encounter for screening for malignant neoplasm of colon: Secondary | ICD-10-CM

## 2020-05-02 LAB — CBC WITH DIFFERENTIAL/PLATELET
Absolute Monocytes: 788 cells/uL (ref 200–950)
Basophils Absolute: 111 cells/uL (ref 0–200)
Basophils Relative: 1.1 %
Eosinophils Absolute: 101 cells/uL (ref 15–500)
Eosinophils Relative: 1 %
HCT: 42.2 % (ref 38.5–50.0)
Hemoglobin: 14.7 g/dL (ref 13.2–17.1)
Lymphs Abs: 2727 cells/uL (ref 850–3900)
MCH: 32.2 pg (ref 27.0–33.0)
MCHC: 34.8 g/dL (ref 32.0–36.0)
MCV: 92.5 fL (ref 80.0–100.0)
MPV: 9.8 fL (ref 7.5–12.5)
Monocytes Relative: 7.8 %
Neutro Abs: 6373 cells/uL (ref 1500–7800)
Neutrophils Relative %: 63.1 %
Platelets: 201 10*3/uL (ref 140–400)
RBC: 4.56 10*6/uL (ref 4.20–5.80)
RDW: 12.8 % (ref 11.0–15.0)
Total Lymphocyte: 27 %
WBC: 10.1 10*3/uL (ref 3.8–10.8)

## 2020-05-02 LAB — HEMOGLOBIN A1C
Hgb A1c MFr Bld: 5.5 % of total Hgb (ref <5.7)
Mean Plasma Glucose: 111 mg/dL
eAG (mmol/L): 6.2 mmol/L

## 2020-05-02 LAB — COMPLETE METABOLIC PANEL WITH GFR
AG Ratio: 1.7 (calc) (ref 1.0–2.5)
ALT: 14 U/L (ref 9–46)
AST: 20 U/L (ref 10–35)
Albumin: 4.2 g/dL (ref 3.6–5.1)
Alkaline phosphatase (APISO): 96 U/L (ref 35–144)
BUN: 19 mg/dL (ref 7–25)
CO2: 28 mmol/L (ref 20–32)
Calcium: 9.3 mg/dL (ref 8.6–10.3)
Chloride: 102 mmol/L (ref 98–110)
Creat: 1.16 mg/dL (ref 0.70–1.18)
GFR, Est African American: 74 mL/min/{1.73_m2} (ref >=60)
GFR, Est Non African American: 63 mL/min/{1.73_m2} (ref >=60)
Globulin: 2.5 g/dL (calc) (ref 1.9–3.7)
Glucose, Bld: 76 mg/dL (ref 65–99)
Potassium: 4.3 mmol/L (ref 3.5–5.3)
Sodium: 139 mmol/L (ref 135–146)
Total Bilirubin: 0.7 mg/dL (ref 0.2–1.2)
Total Protein: 6.7 g/dL (ref 6.1–8.1)

## 2020-05-02 LAB — LIPID PANEL
Cholesterol: 254 mg/dL — ABNORMAL HIGH (ref <200)
HDL: 47 mg/dL (ref >=40)
Non-HDL Cholesterol (Calc): 207 mg/dL (calc) — ABNORMAL HIGH (ref <130)
Total CHOL/HDL Ratio: 5.4 (calc) — ABNORMAL HIGH (ref <5.0)
Triglycerides: 449 mg/dL — ABNORMAL HIGH (ref <150)

## 2020-05-02 LAB — INSULIN, RANDOM: Insulin: 16.3 u[IU]/mL

## 2020-05-02 LAB — MICROALBUMIN / CREATININE URINE RATIO
Creatinine, Urine: 117 mg/dL (ref 20–320)
Microalb Creat Ratio: 2 mcg/mg creat (ref <30)
Microalb, Ur: 0.2 mg/dL

## 2020-05-02 LAB — URINALYSIS, ROUTINE W REFLEX MICROSCOPIC
Bilirubin Urine: NEGATIVE
Glucose, UA: NEGATIVE
Hgb urine dipstick: NEGATIVE
Ketones, ur: NEGATIVE
Leukocytes,Ua: NEGATIVE
Nitrite: NEGATIVE
Protein, ur: NEGATIVE
Specific Gravity, Urine: 1.019 (ref 1.001–1.03)
pH: 6 (ref 5.0–8.0)

## 2020-05-02 LAB — PSA: PSA: 7.14 ng/mL — ABNORMAL HIGH (ref <=4.0)

## 2020-05-02 LAB — MAGNESIUM: Magnesium: 2.2 mg/dL (ref 1.5–2.5)

## 2020-05-02 LAB — VITAMIN D 25 HYDROXY (VIT D DEFICIENCY, FRACTURES): Vit D, 25-Hydroxy: 97 ng/mL (ref 30–100)

## 2020-05-02 LAB — TSH: TSH: 1.58 mIU/L (ref 0.40–4.50)

## 2020-05-04 NOTE — Progress Notes (Signed)
========================================================== ==========================================================  -    PSA - stable about the same over the last 8+ years  ==========================================================  - Total Chol = 254 - Still Very Very  Dangerously elevated for Heart Attack,  Stroke or developing Vascular Dementia (Ideal or goal is less than 180)   - Strongly encourage Restart Crestor - Do you need a new x called in ? ==========================================================  - Diet is still Very Important - Recommend  a much stricter low cholesterol diet   - Cholesterol only comes from animal sources  - ie. meat, dairy, egg yolks  - Eat all the vegetables you want.  - Avoid meat, especially red meat - Beef AND Pork .  - Avoid cheese & dairy - milk & ice cream.     - Cheese is the most concentrated form of trans-fats which  is the worst thing to clog up our arteries.   - Veggie cheese is OK which can be found in the fresh  produce section at [employer] or [employer] or [employer] ==========================================================  - The bad / Dangerous LDL chol could not be measured because   Triglycerides (   449    ) or fats in blood are so high  (goal is less than 150)    - Recommend avoid fried & greasy foods,  sweets / candy,   - Avoid white rice  (brown or wild rice or Quinoa is OK),   - Avoid white potatoes  (sweet potatoes are OK)   - Avoid anything made from white flour  - bagels, doughnuts, rolls, buns, biscuits, white and   wheat breads, pizza crust and traditional  pasta made of white flour & egg white  - (vegetarian pasta or spinach or wheat pasta is OK).    - Multi-grain bread is OK - like multi-grain flat bread or  sandwich thins.   - Avoid alcohol in excess.   - Exercise is also important. ==========================================================  - A1c -Normal - Great - No  Diabetes ==========================================================  - Vitamin D = 97 - Excellent  ==========================================================  - All Else - CBC - Kidneys - Electrolytes - Liver - Magnesium & Thyroid    - all  Normal / OK ==========================================================

## 2020-08-07 ENCOUNTER — Encounter: Payer: Self-pay | Admitting: Adult Health Nurse Practitioner

## 2020-08-07 ENCOUNTER — Other Ambulatory Visit: Payer: Self-pay

## 2020-08-07 ENCOUNTER — Ambulatory Visit (INDEPENDENT_AMBULATORY_CARE_PROVIDER_SITE_OTHER): Payer: Medicare PPO | Admitting: Adult Health Nurse Practitioner

## 2020-08-07 VITALS — BP 140/80 | HR 63 | Temp 97.1°F | Ht 72.0 in | Wt 170.0 lb

## 2020-08-07 DIAGNOSIS — R918 Other nonspecific abnormal finding of lung field: Secondary | ICD-10-CM

## 2020-08-07 DIAGNOSIS — I10 Essential (primary) hypertension: Secondary | ICD-10-CM | POA: Diagnosis not present

## 2020-08-07 DIAGNOSIS — R6889 Other general symptoms and signs: Secondary | ICD-10-CM

## 2020-08-07 DIAGNOSIS — E782 Mixed hyperlipidemia: Secondary | ICD-10-CM | POA: Diagnosis not present

## 2020-08-07 DIAGNOSIS — Z860100 Personal history of colon polyps, unspecified: Secondary | ICD-10-CM

## 2020-08-07 DIAGNOSIS — J449 Chronic obstructive pulmonary disease, unspecified: Secondary | ICD-10-CM | POA: Diagnosis not present

## 2020-08-07 DIAGNOSIS — I2510 Atherosclerotic heart disease of native coronary artery without angina pectoris: Secondary | ICD-10-CM

## 2020-08-07 DIAGNOSIS — E559 Vitamin D deficiency, unspecified: Secondary | ICD-10-CM | POA: Diagnosis not present

## 2020-08-07 DIAGNOSIS — Z6822 Body mass index (BMI) 22.0-22.9, adult: Secondary | ICD-10-CM

## 2020-08-07 DIAGNOSIS — N138 Other obstructive and reflux uropathy: Secondary | ICD-10-CM

## 2020-08-07 DIAGNOSIS — I700 Atherosclerosis of aorta: Secondary | ICD-10-CM

## 2020-08-07 DIAGNOSIS — F17200 Nicotine dependence, unspecified, uncomplicated: Secondary | ICD-10-CM

## 2020-08-07 DIAGNOSIS — Z0001 Encounter for general adult medical examination with abnormal findings: Secondary | ICD-10-CM | POA: Diagnosis not present

## 2020-08-07 DIAGNOSIS — N401 Enlarged prostate with lower urinary tract symptoms: Secondary | ICD-10-CM | POA: Diagnosis not present

## 2020-08-07 DIAGNOSIS — Z6823 Body mass index (BMI) 23.0-23.9, adult: Secondary | ICD-10-CM

## 2020-08-07 DIAGNOSIS — Z122 Encounter for screening for malignant neoplasm of respiratory organs: Secondary | ICD-10-CM

## 2020-08-07 DIAGNOSIS — Z0000 Encounter for general adult medical examination without abnormal findings: Secondary | ICD-10-CM

## 2020-08-07 LAB — COMPLETE METABOLIC PANEL WITH GFR
AG Ratio: 1.8 (calc) (ref 1.0–2.5)
ALT: 14 U/L (ref 9–46)
AST: 19 U/L (ref 10–35)
Albumin: 3.9 g/dL (ref 3.6–5.1)
Alkaline phosphatase (APISO): 102 U/L (ref 35–144)
BUN: 15 mg/dL (ref 7–25)
CO2: 30 mmol/L (ref 20–32)
Calcium: 9.2 mg/dL (ref 8.6–10.3)
Chloride: 105 mmol/L (ref 98–110)
Creat: 0.95 mg/dL (ref 0.70–1.18)
GFR, Est African American: 93 mL/min/{1.73_m2} (ref >=60)
GFR, Est Non African American: 80 mL/min/{1.73_m2} (ref >=60)
Globulin: 2.2 g/dL (calc) (ref 1.9–3.7)
Glucose, Bld: 87 mg/dL (ref 65–99)
Potassium: 4.3 mmol/L (ref 3.5–5.3)
Sodium: 140 mmol/L (ref 135–146)
Total Bilirubin: 0.9 mg/dL (ref 0.2–1.2)
Total Protein: 6.1 g/dL (ref 6.1–8.1)

## 2020-08-07 LAB — CBC WITH DIFFERENTIAL/PLATELET
Absolute Monocytes: 850 cells/uL (ref 200–950)
Basophils Absolute: 111 cells/uL (ref 0–200)
Basophils Relative: 1.3 %
Eosinophils Absolute: 68 cells/uL (ref 15–500)
Eosinophils Relative: 0.8 %
HCT: 40.1 % (ref 38.5–50.0)
Hemoglobin: 13.6 g/dL (ref 13.2–17.1)
Lymphs Abs: 2287 cells/uL (ref 850–3900)
MCH: 31.6 pg (ref 27.0–33.0)
MCHC: 33.9 g/dL (ref 32.0–36.0)
MCV: 93 fL (ref 80.0–100.0)
MPV: 10.3 fL (ref 7.5–12.5)
Monocytes Relative: 10 %
Neutro Abs: 5185 cells/uL (ref 1500–7800)
Neutrophils Relative %: 61 %
Platelets: 163 10*3/uL (ref 140–400)
RBC: 4.31 10*6/uL (ref 4.20–5.80)
RDW: 12.6 % (ref 11.0–15.0)
Total Lymphocyte: 26.9 %
WBC: 8.5 10*3/uL (ref 3.8–10.8)

## 2020-08-07 LAB — LIPID PANEL
Cholesterol: 219 mg/dL — ABNORMAL HIGH (ref <200)
HDL: 45 mg/dL (ref >=40)
LDL Cholesterol (Calc): 142 mg/dL (calc) — ABNORMAL HIGH
Non-HDL Cholesterol (Calc): 174 mg/dL (calc) — ABNORMAL HIGH (ref <130)
Total CHOL/HDL Ratio: 4.9 (calc) (ref <5.0)
Triglycerides: 181 mg/dL — ABNORMAL HIGH (ref <150)

## 2020-08-07 NOTE — Progress Notes (Signed)
MEDICARE ANNUAL WELLNESS VISIT AND FOLLOW UP   Assessment:   Michael [name] was seen today for follow-up and medicare wellness.  Diagnoses and all orders for this visit:  Medicare annual wellness Yearly  Aortic atherosclerosis ([place]) Per CT 10/2019 and numerous others Control blood pressure, cholesterol, glucose, increase exercise.   Chronic obstructive pulmonary disease, unspecified COPD type ([place]) Quit smoking, getting CT screening, improved with trellegy  Pulmonary nodules Follow up Ct is due June 2022  Advised to quit smoking  Smoker Discussed risks associated with tobacco use and advised to reduce or quit Patient is ready to do so, plans to slowly taper; declines chantix, has failed patch, wants to try wellbutrin; 150 mg XR sent in  Will follow up at the next visit  Essential hypertension Continue medication Monitor blood pressure at home; call if consistently over 130/80 Continue DASH diet.   Reminder to go to the ER if any CP, SOB, nausea, dizziness, severe HA, changes vision/speech, left arm numbness and tingling and jaw pain.  ASHD s/p Stent Control blood pressure, cholesterol, glucose, increase exercise.  Followed by cardiology  BPH with obstruction/lower urinary tract symptoms Monitor annual PSA; symptoms controlled by saw palmetto   Elevated PSA Continue annual monitoring; had neg biopsy x 3 by Michael [name]  Vitamin D deficiency At goal at recent check; continue to recommend supplementation for goal of 60-100 Defer vitamin D level  Hyperlipidemia, mixed Patient declines all medications, understands risks after extended discussion Historically fairly controlled with lifestyle; recommended soluble fiber supplement Continue low cholesterol diet and exercise.  Check lipid panel.  -     Lipid panel -     TSH  BMI 23.0-23.9, adult Discussed dietary and exercise modifications  History of colon polyps 5 year follow up recommended by Michael [name] due 12/2023   Further  disposition pending results if labs check today. Discussed med's effects and SE's.   Over 30 minutes of face to face interview, exam, counseling, chart review, and critical decision making was performed.    Future Appointments  Date Time Provider [place]  11/21/2020 10:30 AM Michael Pinto, MD [facility] None  05/04/2021 11:00 AM Michael Pinto, MD [facility] None  08/07/2021 10:30 AM Michael Comber, NP [facility] None     Plan:   During the course of the visit the patient was educated and counseled about appropriate screening and preventive services including:    Pneumococcal vaccine   Influenza vaccine  Prevnar 13  Td vaccine  Screening electrocardiogram  Colorectal cancer screening  Diabetes screening  Glaucoma screening  Nutrition counseling    Subjective:  Michael [name] is a 70 y.o. male who presents for Medicare Annual Wellness Visit and 3 month follow up for HTN,HLD, glucose management, and vitamin D Def.   He currently continues to smoke 1 pack a day; discussed risks associated with smoking, patient is not ready to quit, but does want to try to reduce, declines chantix, has tried patches,not interested in wellbutrin.  He had low dose lung CT screening on 10/23/19 which showed numerous benign appearing nodules, recommended for 12 month follow up.  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  He reports he gets scratched by steel constantly as a Dealer and while working.  Will give tetanus vaccination today.   He has COPD and using trellegy with noticeable benefit.   BMI is Body mass index is 23.06 kg/m., he has been working on diet and exercise. Wt Readings from Last 3  Encounters:  08/07/20 170 lb (77.1 kg)  05/01/20 162 lb 12.8 oz (73.8 kg)  01/03/20 166 lb (75.3 kg)   hx of MI in 2005 with cath; he has had negative cardiolite in 2007 and neg ETT in 2010. 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 Recent CTs have demonstrated aortic atherosclerosis and 3 vessel CAD.  His blood pressure has been controlled at home, today their BP is BP: 140/80 He does workout. He denies chest pain, shortness of breath, dizziness.   He is on cholesterol medication (rosuvastatin 40 mg twice weekly was recommended but never started, strong patient preference, long discussions, he understands risks and absolutely declines at this time. )His cholesterol is not at goal. The cholesterol last visit was:   Lab Results  Component Value Date   CHOL 219 (H) 08/07/2020   HDL 45 08/07/2020   LDLCALC 142 (H) 08/07/2020   TRIG 181 (H) 08/07/2020   CHOLHDL 4.9 08/07/2020   He has been working on diet and exercise for glucose management, and denies increased appetite, nausea, paresthesia of the feet, polydipsia, polyuria and visual disturbances. Last A1C in the office was:  Lab Results  Component Value Date   HGBA1C 5.5 05/01/2020   Last GFR Lab Results  Component Value Date   GFRNONAA 80 08/07/2020   Patient is on Vitamin D supplement.   Lab Results  Component Value Date   VD25OH 64 05/01/2020       He is previously established with Michael [name] for BPH/elevated PSAs which have been stable- negative biopsy x3 in the past, now taking saw palmetto Lab Results  Component Value Date   PSA 7.14 (H) 05/01/2020   PSA 5.6 (H) 03/13/2019   PSA 6.9 (H) 02/28/2018     Medication Review:   Current Outpatient Medications (Cardiovascular):  .  bisoprolol-hydrochlorothiazide (ZIAC) 10-6.25 MG tablet, Take 1 tablet Daily for BP  Current Outpatient Medications (Respiratory):  .  albuterol (VENTOLIN HFA) 108 (90 Base) MCG/ACT inhaler, Inhale 1-2 puffs into the lungs every 6 (six) hours as needed for wheezing or shortness of breath. .  Fluticasone-Umeclidin-Vilant (TRELEGY ELLIPTA) 100-62.5-25 MCG/INH AEPB, Inhale 1 puff into the lungs  daily.  Current Outpatient Medications (Analgesics):  .  aspirin EC 81 MG tablet, Take 81 mg by mouth daily.   Current Outpatient Medications (Other):  .  cholecalciferol (VITAMIN D) 1000 UNITS tablet, 2,000 Units in the morning, at noon, and at bedtime. 6000 total a day .  Magnesium 250 MG TABS, Take by mouth every other day. .  Multiple Vitamin (MULTIVITAMIN) tablet, Take 1 tablet by mouth daily. Takes a vitamin pack daily .  saw palmetto 500 MG capsule, Take 500 mg by mouth daily.  Allergies: Allergies  Allergen Reactions  . Ace Inhibitors Cough  . Ciprofloxacin   . Fenofibrate     dizzy    Current Problems (verified) has BPH with obstruction/lower urinary tract symptoms; Vitamin D deficiency; Chronic obstructive pulmonary disease ([place]); Essential hypertension; Hyperlipidemia, mixed; Abnormal glucose; Smoker; ASHD s/p Stent; History of amputation of finger of left hand; Elevated PSA;                          ; FHx: heart disease; Pulmonary nodules; and History of colonic polyps (hyperplastic x 3 in 2014) on their problem list.  Screening Tests Immunization History  Administered  Date(s) Administered  . PPD Test 12/25/2013  . Pneumococcal Conjugate-13 02/28/2018  . Pneumococcal-Unspecified 12/08/2010  . 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 Influenza: declines  Pneumococcal: 2012 Prevnar13: 02/2018 Shingles/Zostavax: declines  Covid 19: declines at this time, extended discussion  Names of Other Physician/Practitioners you currently use: 1. [facility] here for primary care 2. Dr. Nicki Reaper, eye doctor, last visit 2021 Schedule for 2022, plans to reschedule  3. , dentist, last visit remote, has top dentures, scheduled for 2022  Patient Care  Team: Michael Pinto, MD as PCP - General (Internal Medicine) Lowella Bandy, MD (Inactive) as Consulting Physician (Urology)  Surgical: He  has a past surgical history that includes Prostate surgery; Amputation finger / thumb (Left, 1991); Vasectomy; colonoscopy (N/A, 10/2012); Cardiac catheterization (N/A, 2005); and Cataract extraction, bilateral (Bilateral, 2014). Family His family history includes Cancer in his maternal aunt, maternal uncle, and maternal uncle; Cancer (age of onset: 57) in his father; Heart failure in his maternal grandfather; Liver disease in his paternal grandfather; Other in his son; Pneumonia in his paternal grandmother; Stroke in his maternal grandmother; Stroke (age of onset: 87) in his son. Social history  He reports that he has been smoking cigarettes. He started smoking about 57 years ago. He has a 45.00 pack-year smoking history. He has never used smokeless tobacco. He reports current alcohol use of about 5.0 standard drinks of alcohol per week. He reports that he does not use drugs.  MEDICARE WELLNESS OBJECTIVES: Physical activity: Current Exercise Habits: The patient has a physically strenuous job, but has no regular exercise apart from work., Exercise limited by: None identified Cardiac risk factors: Cardiac Risk Factors include: advanced age (>81men, >75 women);hypertension;male gender;dyslipidemia;smoking/ tobacco exposure Depression/mood screen:   Depression screen [facility] 2/9 08/07/2020  Decreased Interest 0  Down, Depressed, Hopeless 0  PHQ - 2 Score 0    ADLs:  In your present state of health, do you have any difficulty performing the following activities: 08/07/2020 05/01/2020  Hearing? N N  Vision? N N  Difficulty concentrating or making decisions? N N  Walking or climbing stairs? N N  Dressing or bathing? N N  Doing errands, shopping? N N  Preparing Food and eating ? N -  Using the Toilet? N -  In the past six months, have you accidently leaked urine?  N -  Do you have problems with loss of bowel control? N -  Managing your Medications? N -  Managing your Finances? N -  Housekeeping or managing your Housekeeping? N -  Some recent data might be hidden     Cognitive Testing  Alert? Yes  Normal Appearance?Yes  Oriented to person? Yes  Place? Yes   Time? Yes  Recall of three objects?  Yes  Can perform simple calculations? Yes  Displays appropriate judgment?Yes  Can read the correct time from a watch face?Yes  EOL planning: Does Patient Have a Medical Advance Directive?: No Would patient like information on creating a medical advance directive?: Yes (MAU/Ambulatory/Procedural Areas - Information given)   Objective:   Today's Vitals   08/07/20 1031  BP: 140/80  Pulse: 63  Temp: (!) 97.1 F (36.2 C)  SpO2: 98%  Weight: 170 lb (77.1 kg)  Height: 6' (1.829 m)  PainSc: 0-No pain   Body mass index  is 23.06 kg/m.  General appearance: alert, no distress, WD/WN, male HEENT: normocephalic, sclerae anicteric, TMs pearly, nares patent, no discharge or erythema, pharynx normal Oral cavity: MMM, no lesions Neck: supple, no lymphadenopathy, no thyromegaly, no masses Heart: RRR, normal S1, S2, no murmurs Lungs: CTA bilaterally, no wheezes, rhonchi, or rales Abdomen: +Bs, soft, non tender, non distended, no masses, no hepatomegaly, no splenomegaly Musculoskeletal: nontender, no swelling, left 2nd and 3rd digits with distal phlange amputated (remotely, well healed) Extremities: no edema, no cyanosis, no clubbing Pulses: 2+ symmetric, upper and lower extremities, normal cap refill Neurological: alert, oriented x 3, CN2-12 intact, strength normal upper extremities and lower extremities, sensation normal throughout, DTRs 2+ throughout, no cerebellar signs, gait normal Psychiatric: normal affect, behavior normal, pleasant     Garnet Sierras, Laqueta Jean, DNP [facility] 08/07/2020  2:45 PM

## 2020-08-19 ENCOUNTER — Other Ambulatory Visit: Payer: Self-pay | Admitting: Adult Health Nurse Practitioner

## 2020-08-19 DIAGNOSIS — F17200 Nicotine dependence, unspecified, uncomplicated: Secondary | ICD-10-CM

## 2020-09-25 DIAGNOSIS — H40013 Open angle with borderline findings, low risk, bilateral: Secondary | ICD-10-CM | POA: Diagnosis not present

## 2020-10-01 NOTE — Progress Notes (Signed)
    Future Appointments  Date Time Provider [place]  10/02/2020  9:00 AM Unk Pinto, MD [facility] None  10/27/2020  8:30 AM [facility] 1 [facility]  11/21/2020 10:30 AM Unk Pinto, MD [facility] None  05/04/2021 11:00 AM Unk Pinto, MD [facility] None  08/07/2021 10:30 AM Liane Comber, NP [facility] None    History of Present Illness:    The patient is a very nice 70 yo DWM with HTN, CAD  Who is a glaucoma suspect (Rt eye) referred in by his Optometrist, Dr Macarthur Critchley in consideration of referral for carotid dopplers .    Medications   Current Outpatient Medications (Cardiovascular):  .  bisoprolol-hydrochlorothiazide (ZIAC) 10-6.25 MG tablet, Take 1 tablet Daily for BP  Current Outpatient Medications (Respiratory):  .  albuterol (VENTOLIN HFA) 108 (90 Base) MCG/ACT inhaler, Inhale 1-2 puffs into the lungs every 6 (six) hours as needed for wheezing or shortness of breath. .  Fluticasone-Umeclidin-Vilant (TRELEGY ELLIPTA) 100-62.5-25 MCG/INH AEPB, Inhale 1 puff into the lungs daily. Marland Kitchen  aspirin EC 81 MG tablet, Take daily. Marland Kitchen  cVITAMIN D) 1000 UNITS t, 2,000 Units in the morning, at noon, and at bedtime. 6000 total a day .  Magnesium 250 MG TABS, Take by mouth every other day. .  Multiple Vitamin (MULTIVITAMIN) tablet, Take 1 tablet by mouth daily. Takes a vitamin pack daily .  saw palmetto 500 MG capsule, Take 500 mg by mouth daily.  Problem list He    Observations/Objective:  BP 112/60   Pulse 64   Temp (!) 97.3 F (36.3 C)   Resp 16   Ht 6' (1.829 m)   Wt 165 lb 12.8 oz (75.2 kg)   SpO2 99%   BMI 22.49 kg/m   HEENT - WNL. Neck - supple. Car 2+ w/o Bruits Chest - Clear equal BS. Cor - Nl HS. RRR w/o sig MGR. PP 1(+). No edema. MS- FROM w/o deformities.  Gait Nl. Neuro -  Nl w/o focal abnormalities.   Assessment and Plan:  1. Essential hypertension   2. ASHD s/p Stent  3. 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 Aortic atherosclerosis ([place])        I discussed the assessment and treatment plan with the patient. The patient was provided an opportunity to ask questions and all were answered. The patient agreed with the plan and demonstrated an understanding of the instructions.        The patient was advised to call back or seek an in-person evaluation if the symptoms worsen or if the condition fails to improve as anticipated.    Kirtland Bouchard, MD

## 2020-10-02 ENCOUNTER — Encounter: Payer: Self-pay | Admitting: Internal Medicine

## 2020-10-02 ENCOUNTER — Ambulatory Visit (INDEPENDENT_AMBULATORY_CARE_PROVIDER_SITE_OTHER): Payer: Medicare PPO | Admitting: Internal Medicine

## 2020-10-02 ENCOUNTER — Other Ambulatory Visit: Payer: Self-pay

## 2020-10-02 VITALS — BP 112/60 | HR 64 | Temp 97.3°F | Resp 16 | Ht 72.0 in | Wt 165.8 lb

## 2020-10-02 DIAGNOSIS — I10 Essential (primary) hypertension: Secondary | ICD-10-CM

## 2020-10-02 DIAGNOSIS — H40001 Preglaucoma, unspecified, right eye: Secondary | ICD-10-CM

## 2020-10-02 DIAGNOSIS — I2510 Atherosclerotic heart disease of native coronary artery without angina pectoris: Secondary | ICD-10-CM

## 2020-10-02 DIAGNOSIS — I700 Atherosclerosis of aorta: Secondary | ICD-10-CM

## 2020-10-02 DIAGNOSIS — R6889 Other general symptoms and signs: Secondary | ICD-10-CM

## 2020-10-24 ENCOUNTER — Other Ambulatory Visit (HOSPITAL_COMMUNITY): Payer: Self-pay | Admitting: Internal Medicine

## 2020-10-24 ENCOUNTER — Other Ambulatory Visit: Payer: Self-pay

## 2020-10-24 ENCOUNTER — Ambulatory Visit (HOSPITAL_COMMUNITY)
Admission: RE | Admit: 2020-10-24 | Discharge: 2020-10-24 | Disposition: A | Discharge status: Home / Self Care | Payer: Medicare PPO | Source: Ambulatory Visit | Attending: Cardiovascular Disease | Admitting: Cardiovascular Disease

## 2020-10-24 DIAGNOSIS — H40001 Preglaucoma, unspecified, right eye: Secondary | ICD-10-CM | POA: Diagnosis not present

## 2020-10-24 DIAGNOSIS — I700 Atherosclerosis of aorta: Secondary | ICD-10-CM | POA: Diagnosis not present

## 2020-10-24 DIAGNOSIS — I2510 Atherosclerotic heart disease of native coronary artery without angina pectoris: Secondary | ICD-10-CM

## 2020-10-24 DIAGNOSIS — I6522 Occlusion and stenosis of left carotid artery: Secondary | ICD-10-CM

## 2020-10-24 DIAGNOSIS — R6889 Other general symptoms and signs: Secondary | ICD-10-CM

## 2020-10-27 ENCOUNTER — Ambulatory Visit
Admission: RE | Admit: 2020-10-27 | Discharge: 2020-10-27 | Disposition: A | Discharge status: Home / Self Care | Payer: Medicare PPO | Source: Ambulatory Visit | Attending: Adult Health Nurse Practitioner | Admitting: Adult Health Nurse Practitioner

## 2020-10-27 DIAGNOSIS — F17200 Nicotine dependence, unspecified, uncomplicated: Secondary | ICD-10-CM

## 2020-10-27 DIAGNOSIS — F17210 Nicotine dependence, cigarettes, uncomplicated: Secondary | ICD-10-CM | POA: Diagnosis not present

## 2020-11-05 ENCOUNTER — Other Ambulatory Visit: Payer: Self-pay | Admitting: Internal Medicine

## 2020-11-12 ENCOUNTER — Other Ambulatory Visit: Payer: Self-pay | Admitting: Internal Medicine

## 2020-11-12 DIAGNOSIS — J449 Chronic obstructive pulmonary disease, unspecified: Secondary | ICD-10-CM

## 2020-11-21 ENCOUNTER — Ambulatory Visit: Payer: Medicare PPO | Admitting: Internal Medicine

## 2020-12-09 NOTE — Progress Notes (Signed)
Future Appointments  Date Time Provider [place]  12/10/2020  9:00 AM Unk Pinto, MD [facility] None  05/04/2021  -  CPE 11:00 AM Unk Pinto, MD [facility] None  08/07/2021  -  Wellness  10:30 AM Liane Comber, NP [facility] None    History of Present Illness:       This very nice 70 y.o. DWM presents for 6 month follow up with HTN, HLD, ASCAD, Pre-Diabetes and Vitamin D Deficiency.                                                           Chest CTscan on 10/27/2020 showed Aortic Atherosclerosis.  Chest CT scan also showed new pulm nodules (<7.0 mm) in the LUL and Radiologist recommended repeating CT Lung scan in 3 months (due ~ mid Sept).  Patient does endorse chronic exertional dyspnea, but denies any hemoptysis or significant sputum production.      Patient is treated for HTN & BP has been controlled at home. Today's BP is at goal - 140/77. Patient has had no complaints of any cardiac type chest pain, palpitations, dyspnea / orthopnea / PND, dizziness, claudication, or dependent edema.       Hyperlipidemia is controlled with diet & meds. Patient denies myalgias or other med SE's. Last Lipids were not at goal:  Lab Results  Component Value Date   CHOL 219 (H) 08/07/2020   HDL 45 08/07/2020   LDLCALC 142 (H) 08/07/2020   TRIG 181 (H) 08/07/2020   CHOLHDL 4.9 08/07/2020     Also, the patient has history of PreDiabetes and has had no symptoms of reactive hypoglycemia, diabetic polys, paresthesias or visual blurring.  Last A1c was normal & at goal:  Lab Results  Component Value Date   HGBA1C 5.5 05/01/2020        Further, the patient also has history of Vitamin D Deficiency and supplements vitamin D without any suspected side-effects. Last vitamin D was   Lab Results  Component Value Date   VD25OH 97 05/01/2020     Current Outpatient Medications on File Prior to Visit  Medication Sig   albuterol (VENTOLIN HFA) 108 (90 Base) MCG/ACT inhaler  Inhale 1-2 puffs into the lungs every 6 (six) hours as needed for wheezing or shortness of breath.   aspirin EC 81 MG tablet Take 81 mg by mouth daily.   b complex vitamins capsule Take 1 capsule by mouth daily.   bisoprolol-hctz  0-6.25 MG tablet TAKE 1 TABLET EVERY DAY FOR BLOOD PRESSURE   VITAMIN D 1000 UNITS tablet 2,000 Units in the morning, at noon, and at bedtime. 6000 total a day   Magnesium 250 MG TABS Take by mouth every other day.   Multiple Vitamin  Take 1 tablet by mouth daily. Takes a vitamin pack daily   saw palmetto 500 MG capsule Take 500 mg by mouth daily.   TRELEGY ELLIPTA 100-62.5-25  INHALE 1 PUFF DAILY.    Allergies  Allergen Reactions   Ace Inhibitors Cough   Ciprofloxacin    Fenofibrate     dizzy     PMHx:   Past Medical History:  Diagnosis Date   BPH (benign prostatic hyperplasia)    COPD (chronic obstructive pulmonary disease) ([place])    via CT  lung   GERD (gastroesophageal reflux disease)    Hyperlipidemia    Hypertension    Hypogonadism male    Myocardial infarction [facility]) 2005   Per patient no stent or PTCA, just medical treatment   Prediabetes    Vitamin D deficiency      Immunization History  Administered Date(s) Administered   PPD Test 12/25/2013   Pneumococcal Conjugate-13 02/28/2018   Pneumococcal-Unspecified 12/08/2010   Tdap 12/08/2010     Past Surgical History:  Procedure Laterality Date   AMPUTATION FINGER / THUMB Left 1991   distal index   CARDIAC CATHETERIZATION N/A 2005   Per patient no stent or PTCA, just medical treatment   CATARACT EXTRACTION, BILATERAL Bilateral 2014   Dr. Talbert Forest    colonoscopy N/A 10/2012   Neg and recc f/u Cololnoscopy in 5 yeary - Dr Nicki Reaper [place] - Gastroenterologist   PROSTATE SURGERY     Biopsy X 3   VASECTOMY      FHx:    Reviewed / unchanged  SHx:    Reviewed / unchanged   Systems Review:  Constitutional: Denies fever, chills, wt changes, headaches, insomnia, fatigue, night sweats,  change in appetite. Eyes: Denies redness, blurred vision, diplopia, discharge, itchy, watery eyes.  ENT: Denies discharge, congestion, post nasal drip, epistaxis, sore throat, earache, hearing loss, dental pain, tinnitus, vertigo, sinus pain, snoring.  CV: Denies chest pain, palpitations, irregular heartbeat, syncope, dyspnea, diaphoresis, orthopnea, PND, claudication or edema. Respiratory: denies cough, dyspnea, DOE, pleurisy, hoarseness, laryngitis, wheezing.  Gastrointestinal: Denies dysphagia, odynophagia, heartburn, reflux, water brash, abdominal pain or cramps, nausea, vomiting, bloating, diarrhea, constipation, hematemesis, melena, hematochezia  or hemorrhoids. Genitourinary: Denies dysuria, frequency, urgency, nocturia, hesitancy, discharge, hematuria or flank pain. Musculoskeletal: Denies arthralgias, myalgias, stiffness, jt. swelling, pain, limping or strain/sprain.  Skin: Denies pruritus, rash, hives, warts, acne, eczema or change in skin lesion(s). Neuro: No weakness, tremor, incoordination, spasms, paresthesia or pain. Psychiatric: Denies confusion, memory loss or sensory loss. Endo: Denies change in weight, skin or hair change.  Heme/Lymph: No excessive bleeding, bruising or enlarged lymph nodes.  Physical Exam  BP 140/77   Pulse 62   Temp 97.6 F (36.4 C)   Resp 16   Ht 6' (1.829 m)   Wt 165 lb 9.6 oz (75.1 kg)   SpO2 97%   BMI 22.46 kg/m   Appears  well nourished, well groomed  and in no distress.  Eyes: PERRLA, EOMs, conjunctiva no swelling or erythema. Sinuses: No frontal/maxillary tenderness ENT/Mouth: EAC's clear, TM's nl w/o erythema, bulging. Nares clear w/o erythema, swelling, exudates. Oropharynx clear without erythema or exudates. Oral hygiene is good. Tongue normal, non obstructing. Hearing intact.  Neck: Supple. Thyroid not palpable. Car 2+/2+ without bruits, nodes or JVD. Chest: Respirations nl with BS clear & equal w/o rales, rhonchi, wheezing or stridor.   Cor: Heart sounds normal w/ regular rate and rhythm without sig. murmurs, gallops, clicks or rubs. Peripheral pulses normal and equal  without edema.  Abdomen: Soft & bowel sounds normal. Non-tender w/o guarding, rebound, hernias, masses or organomegaly.  Lymphatics: Unremarkable.  Musculoskeletal: Full ROM all peripheral extremities, joint stability, 5/5 strength and normal gait.  Skin: Warm, dry without exposed rashes, lesions or ecchymosis apparent.  Neuro: Cranial nerves intact, reflexes equal bilaterally. Sensory-motor testing grossly intact. Tendon reflexes grossly intact.  Pysch: Alert & oriented x 3.  Insight and judgement nl & appropriate. No ideations.  Assessment and Plan:  1. Essential hypertension  - Continue medication, monitor blood pressure  at home.  - Continue DASH diet.  Reminder to go to the ER if any CP,  SOB, nausea, dizziness, severe HA, changes vision/speech.   - CBC with Differential/Platelet - COMPLETE METABOLIC PANEL WITH GFR - Magnesium - TSH  2. Hyperlipidemia, mixed  - Continue diet/meds, exercise,& lifestyle modifications.  - Continue monitor periodic cholesterol/liver & renal functions    - Lipid panel - TSH  3. Abnormal glucose  - Continue diet, exercise  - Lifestyle modifications.  - Monitor appropriate labs.  - Hemoglobin A1c - Insulin, random  4. Vitamin D deficiency  - Continue supplementation.  - VITAMIN D 25 Hydroxy 5. ASHD s/p Stent  - Lipid panel  6. Aortic atherosclerosis ([place]) by Chest CT scan on 10/27/2020  - Lipid panel  7. Pulmonary nodules  - CT CHEST LUNG CA SCREEN LOW DOSE W/O CM; Future  8. Medication management  - CBC with Differential/Platelet - COMPLETE METABOLIC PANEL WITH GFR - Magnesium - Lipid panel - TSH - Hemoglobin A1c - Insulin, random - VITAMIN D 25 Hydroxy        Discussed  regular exercise, BP monitoring, weight control to achieve/maintain BMI less than 25 and discussed med and SE's.  Recommended labs to assess and monitor clinical status with further disposition pending results of labs.  I discussed the assessment and treatment plan with the patient. The patient was provided an opportunity to ask questions and all were answered. The patient agreed with the plan and demonstrated an understanding of the instructions.  I provided over 30 minutes of exam, counseling, chart review and  complex critical decision making.         The patient was advised to call back or seek an in-person evaluation if the symptoms worsen or if the condition fails to improve as anticipated.   Kirtland Bouchard, MD

## 2020-12-09 NOTE — Patient Instructions (Signed)

## 2020-12-10 ENCOUNTER — Encounter: Payer: Self-pay | Admitting: Internal Medicine

## 2020-12-10 ENCOUNTER — Other Ambulatory Visit: Payer: Self-pay

## 2020-12-10 ENCOUNTER — Ambulatory Visit (INDEPENDENT_AMBULATORY_CARE_PROVIDER_SITE_OTHER): Payer: Medicare PPO | Admitting: Internal Medicine

## 2020-12-10 VITALS — BP 140/77 | HR 62 | Temp 97.6°F | Resp 16 | Ht 72.0 in | Wt 165.6 lb

## 2020-12-10 DIAGNOSIS — E559 Vitamin D deficiency, unspecified: Secondary | ICD-10-CM | POA: Diagnosis not present

## 2020-12-10 DIAGNOSIS — I10 Essential (primary) hypertension: Secondary | ICD-10-CM | POA: Diagnosis not present

## 2020-12-10 DIAGNOSIS — I2510 Atherosclerotic heart disease of native coronary artery without angina pectoris: Secondary | ICD-10-CM

## 2020-12-10 DIAGNOSIS — R918 Other nonspecific abnormal finding of lung field: Secondary | ICD-10-CM

## 2020-12-10 DIAGNOSIS — I700 Atherosclerosis of aorta: Secondary | ICD-10-CM

## 2020-12-10 DIAGNOSIS — R7309 Other abnormal glucose: Secondary | ICD-10-CM

## 2020-12-10 DIAGNOSIS — E782 Mixed hyperlipidemia: Secondary | ICD-10-CM

## 2020-12-10 DIAGNOSIS — Z79899 Other long term (current) drug therapy: Secondary | ICD-10-CM | POA: Diagnosis not present

## 2020-12-11 ENCOUNTER — Encounter: Payer: Self-pay | Admitting: Internal Medicine

## 2020-12-11 ENCOUNTER — Other Ambulatory Visit: Payer: Self-pay | Admitting: Internal Medicine

## 2020-12-11 DIAGNOSIS — E782 Mixed hyperlipidemia: Secondary | ICD-10-CM

## 2020-12-11 LAB — COMPLETE METABOLIC PANEL WITH GFR
AG Ratio: 1.9 (calc) (ref 1.0–2.5)
ALT: 13 U/L (ref 9–46)
AST: 19 U/L (ref 10–35)
Albumin: 4.2 g/dL (ref 3.6–5.1)
Alkaline phosphatase (APISO): 95 U/L (ref 35–144)
BUN: 21 mg/dL (ref 7–25)
CO2: 27 mmol/L (ref 20–32)
Calcium: 8.9 mg/dL (ref 8.6–10.3)
Chloride: 103 mmol/L (ref 98–110)
Creat: 1.01 mg/dL (ref 0.70–1.28)
Globulin: 2.2 g/dL (calc) (ref 1.9–3.7)
Glucose, Bld: 95 mg/dL (ref 65–99)
Potassium: 4 mmol/L (ref 3.5–5.3)
Sodium: 138 mmol/L (ref 135–146)
Total Bilirubin: 0.9 mg/dL (ref 0.2–1.2)
Total Protein: 6.4 g/dL (ref 6.1–8.1)
eGFR: 80 mL/min/{1.73_m2} (ref >=60)

## 2020-12-11 LAB — CBC WITH DIFFERENTIAL/PLATELET
Absolute Monocytes: 819 cells/uL (ref 200–950)
Basophils Absolute: 73 cells/uL (ref 0–200)
Basophils Relative: 0.8 %
Eosinophils Absolute: 109 cells/uL (ref 15–500)
Eosinophils Relative: 1.2 %
HCT: 41.8 % (ref 38.5–50.0)
Hemoglobin: 14.1 g/dL (ref 13.2–17.1)
Lymphs Abs: 2366 cells/uL (ref 850–3900)
MCH: 31.2 pg (ref 27.0–33.0)
MCHC: 33.7 g/dL (ref 32.0–36.0)
MCV: 92.5 fL (ref 80.0–100.0)
MPV: 10.1 fL (ref 7.5–12.5)
Monocytes Relative: 9 %
Neutro Abs: 5733 cells/uL (ref 1500–7800)
Neutrophils Relative %: 63 %
Platelets: 160 10*3/uL (ref 140–400)
RBC: 4.52 10*6/uL (ref 4.20–5.80)
RDW: 13.1 % (ref 11.0–15.0)
Total Lymphocyte: 26 %
WBC: 9.1 10*3/uL (ref 3.8–10.8)

## 2020-12-11 LAB — VITAMIN D 25 HYDROXY (VIT D DEFICIENCY, FRACTURES): Vit D, 25-Hydroxy: 112 ng/mL — ABNORMAL HIGH (ref 30–100)

## 2020-12-11 LAB — LIPID PANEL
Cholesterol: 248 mg/dL — ABNORMAL HIGH (ref <200)
HDL: 46 mg/dL (ref >=40)
LDL Cholesterol (Calc): 158 mg/dL (calc) — ABNORMAL HIGH
Non-HDL Cholesterol (Calc): 202 mg/dL (calc) — ABNORMAL HIGH (ref <130)
Total CHOL/HDL Ratio: 5.4 (calc) — ABNORMAL HIGH (ref <5.0)
Triglycerides: 268 mg/dL — ABNORMAL HIGH (ref <150)

## 2020-12-11 LAB — INSULIN, RANDOM: Insulin: 12.4 u[IU]/mL

## 2020-12-11 LAB — HEMOGLOBIN A1C
Hgb A1c MFr Bld: 5.5 % of total Hgb (ref <5.7)
Mean Plasma Glucose: 111 mg/dL
eAG (mmol/L): 6.2 mmol/L

## 2020-12-11 LAB — MAGNESIUM: Magnesium: 2.3 mg/dL (ref 1.5–2.5)

## 2020-12-11 LAB — TSH: TSH: 1.98 mIU/L (ref 0.40–4.50)

## 2020-12-11 MED ORDER — ROSUVASTATIN CALCIUM 10 MG PO TABS: ORAL_TABLET | ORAL | 3 refills | Status: DC

## 2020-12-11 NOTE — Progress Notes (Signed)
============================================================ -   Test results slightly outside the reference range are not unusual. If there is anything important, I will review this with you,  otherwise it is considered normal test values.  If you have further questions,  please do not hesitate to contact me at the office or via My Chart.  ============================================================ ============================================================  Total Chol = 248 - very elevated            (  Ideal or Goal is less than 180  )  - and   -  Bad / Dangerous LDL Chol = 158  !  !  !           (  Ideal or Goal is less than 70  !  !  ! )   - Sent in Rx for Low Dose Crestor to try and prevent ANOTHER Heart Attack  !  - And  Diet is Still very Important  - Recommend a stricter  Low cholesterol diet   - Cholesterol only comes from animal sources  - ie. meat, dairy, egg yolks  - Eat all the vegetables you want.  - Avoid meat, especially red meat - Beef AND Pork .  - Avoid cheese & dairy - milk & ice cream.     - Cheese is the most concentrated form of trans-fats which  is the worst thing to clog up our arteries.   - Veggie cheese is OK which can be found in the fresh  produce section at [employer] or [employer] or [employer]  ============================================================ ============================================================  -  Also Triglycerides (  268  ) or fats in blood are too high  (goal is less than 150)    - Recommend avoid fried & greasy foods,  sweets / candy,   - Avoid white rice  (brown or wild rice or Quinoa is OK),   - Avoid white potatoes  (sweet potatoes are OK)   - Avoid anything made from white flour  - bagels, doughnuts, rolls, buns, biscuits, white and   wheat breads, pizza crust and traditional  pasta made of white flour & egg white  - (vegetarian pasta or spinach or wheat pasta is OK).    - Multi-grain bread is OK  - like multi-grain flat bread or  sandwich thins.   - Avoid alcohol in excess.  ============================================================ ============================================================ - Exercise is also important. ============================================================ ============================================================  -  A1c is Normal - Great - No Diabetes ! ============================================================ ============================================================  -  Vitamin D = 112 - slightly elevated           Ideal or Goal is between 70-100 , So   - Since currently on Vitamin D = 6,000 units /day,  - Recommend Cut down to 4,000 units /day  ============================================================ ============================================================  -  All Else - CBC - Kidneys - Electrolytes - Liver - Magnesium & Thyroid    - all  Normal / OK ============================================================ ============================================================

## 2020-12-17 ENCOUNTER — Other Ambulatory Visit: Payer: Self-pay | Admitting: Internal Medicine

## 2020-12-17 DIAGNOSIS — R918 Other nonspecific abnormal finding of lung field: Secondary | ICD-10-CM

## 2021-01-07 ENCOUNTER — Ambulatory Visit: Payer: Medicare PPO | Admitting: Adult Health

## 2021-01-28 ENCOUNTER — Other Ambulatory Visit: Payer: Self-pay

## 2021-01-28 ENCOUNTER — Ambulatory Visit
Admission: RE | Admit: 2021-01-28 | Discharge: 2021-01-28 | Disposition: A | Discharge status: Home / Self Care | Payer: Medicare PPO | Source: Ambulatory Visit | Attending: Internal Medicine | Admitting: Internal Medicine

## 2021-01-28 DIAGNOSIS — I700 Atherosclerosis of aorta: Secondary | ICD-10-CM | POA: Diagnosis not present

## 2021-01-28 DIAGNOSIS — J439 Emphysema, unspecified: Secondary | ICD-10-CM | POA: Diagnosis not present

## 2021-01-28 DIAGNOSIS — R911 Solitary pulmonary nodule: Secondary | ICD-10-CM | POA: Diagnosis not present

## 2021-01-28 DIAGNOSIS — R918 Other nonspecific abnormal finding of lung field: Secondary | ICD-10-CM | POA: Diagnosis not present

## 2021-01-29 NOTE — Progress Notes (Signed)
============================================================ ============================================================    Recommended continued annual f/u to monitor   -  Chest CT  - OK - So No significant change. Radiologist recommended continued annual follow- up CT scan

## 2021-03-10 ENCOUNTER — Other Ambulatory Visit (HOSPITAL_BASED_OUTPATIENT_CLINIC_OR_DEPARTMENT_OTHER): Payer: Self-pay | Admitting: Internal Medicine

## 2021-03-10 DIAGNOSIS — I6523 Occlusion and stenosis of bilateral carotid arteries: Secondary | ICD-10-CM

## 2021-05-03 ENCOUNTER — Encounter: Payer: Self-pay | Admitting: Internal Medicine

## 2021-05-03 DIAGNOSIS — Z1211 Encounter for screening for malignant neoplasm of colon: Secondary | ICD-10-CM | POA: Insufficient documentation

## 2021-05-03 NOTE — Patient Instructions (Signed)

## 2021-05-03 NOTE — Progress Notes (Signed)
Annual  Screening/Preventative Visit  & Comprehensive Evaluation & Examination  Future Appointments  Date Time Provider Department  05/04/2021 11:00 AM Unk Pinto, MD [facility]  08/07/2021   Wellness  10:30 AM Liane Comber, NP [facility]            This very nice 70 y.o. DWM  presents for a Screening /Preventative Visit & comprehensive evaluation and management of multiple medical co-morbidities.  Patient has been followed for HTN, HLD, Prediabetes and Vitamin D Deficiency.  Chest CT on 10/27/2020  demonstrated Aortic atherosclerosis . Patient has COPD consequent of continued smoking >1 PPD over 55+ years and COPD confirmed on Lung CT scan.        HTN predates circa 2004. Patient's BP has been controlled at home.  Today's BP: 140/78. 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 Patient denies any cardiac symptoms as exertional chest pain, palpitations, shortness of breath, dizziness or ankle swelling.       Patient's hyperlipidemia is not controlled with diet and ezetimibe and for hx of Statin Intolerance, he was started on low dose Crestor 10 mg /day.  Patient denies myalgias or other medication SE's. Last lipids were not at goal :  Lab Results  Component Value Date   CHOL 248 (H) 12/10/2020   HDL 46 12/10/2020   LDLCALC 158 (H) 12/10/2020   TRIG 268 (H) 12/10/2020   CHOLHDL 5.4 (H) 12/10/2020         Patient has hx/o prediabetes (A1c_5.7% _/2010) and patient denies reactive hypoglycemic symptoms, visual blurring, diabetic polys or paresthesias. Last A1c was normal & at goal :   Lab Results  Component Value Date   HGBA1C 5.5 12/10/2020          Finally, patient has history of Vitamin D Deficiency ("25" /2009) and last vitamin D was slightly elevated  & advised taper Vit D 6,000 to 4,000 u /day :   Lab Results  Component Value Date   VD25OH 112 (H) 12/10/2020     Current Outpatient Medications on File Prior  to Visit  Medication Sig   albuterol   HFA inhaler Inhale 1-2 puffs  every 6 hours as needed    aspirin EC 81 MG tablet Take  daily.   b complex vitamins c  Take 1 capsule  daily.   bisoprolol-hctz 10-6.25 MG tablet TAKE 1 TABLET EVERY DAY   VITAMIN D 1000 UNITS tablet 2,000 Units 3x /day = 6000 total    Magnesium 250 MG TABS Take every other day.   Multiple Vitamin  Takes a vitamin pack daily   rosuvastatin 10 MG tablet Take  1 tablet  Daily      saw palmetto 500 MG capsule Take  daily.   TRELEGY ELLIPTA 100-62.5-25  INHALE 1 PUFF  DAILY.     Allergies  Allergen Reactions   Ace Inhibitors Cough   Ciprofloxacin    Fenofibrate     dizzy     Past Medical History:  Diagnosis Date   BPH (benign prostatic hyperplasia)    COPD (chronic obstructive pulmonary disease) ([facility])    via CT lung   GERD (gastroesophageal reflux disease)    Hyperlipidemia    Hypertension    Hypogonadism male    Myocardial infarction [facility]) 2005   Per patient no stent or PTCA, just medical treatment   Prediabetes    Vitamin D deficiency      Health Maintenance  Topic Date Due   COVID-19 Vaccine (1) Never done   Zoster Vaccines- Shingrix (1 of 2) Never done   Pneumonia Vaccine 86+ Years old (2 - PPSV23 if available, else PCV20) 03/01/2019   TETANUS/TDAP  12/07/2020   INFLUENZA VACCINE  Never done   COLONOSCOPY  01/12/2024   Hepatitis C Screening  Completed   HPV VACCINES  Aged Out     Immunization History  Administered Date(s) Administered   PPD Test 12/25/2013   Pneumococcal -13 02/28/2018   Pneumococcal-23 12/08/2010   Tdap 12/08/2010    Last Colon - 01/12/2019 - Dr Nicki Reaper [facility] - recc 5 year f/u due Aug /Sept 2025.   Past Surgical History:  Procedure Laterality Date   AMPUTATION FINGER / THUMB Left 1991   distal index   CARDIAC CATHETERIZATION N/A 2005   Per patient no stent or PTCA, just medical treatment   CATARACT EXTRACTION, BILATERAL Bilateral 2014   Dr.  Talbert Forest    colonoscopy N/A 10/2012   Neg and recc f/u Cololnoscopy in 5 yeary - Dr Nicki Reaper O/[place] - Gastroenterologist   PROSTATE SURGERY     Biopsy X 3   VASECTOMY       Family History  Problem Relation Age of Onset   Cancer Father 67       lymphoma   Cancer Maternal Aunt        Lung   Cancer Maternal Uncle        Lung   Cancer Maternal Uncle        Lung   Stroke Maternal Grandmother    Heart failure Maternal Grandfather    Pneumonia Paternal Grandmother    Liver disease Paternal Grandfather    Stroke Son 21   Other Son        internal intestinal hemorage     Social History   Tobacco Use   Smoking status: Every Day    Packs/day: 1.00    Years: 45.00    Pack years: 45.00    Types: Cigarettes    Start date: 1965   Smokeless tobacco: Never  Substance Use Topics   Alcohol use: Yes    Alcohol/week: 5.0 standard drinks    Types: 5 Cans of beer per week   Drug use: No      ROS Constitutional: Denies fever, chills, weight loss/gain, headaches, insomnia,  night sweats or change in appetite. Does c/o fatigue. Eyes: Denies redness, blurred vision, diplopia, discharge, itchy or watery eyes.  ENT: Denies discharge, congestion, post nasal drip, epistaxis, sore throat, earache, hearing loss, dental pain, Tinnitus, Vertigo, Sinus pain or snoring.  Cardio: Denies chest pain, palpitations, irregular heartbeat, syncope, dyspnea, diaphoresis, orthopnea, PND, claudication or edema Respiratory: denies cough, dyspnea, DOE, pleurisy, hoarseness, laryngitis or wheezing.  Gastrointestinal: Denies dysphagia, heartburn, reflux, water brash, pain, cramps, nausea, vomiting, bloating, diarrhea, constipation, hematemesis, melena, hematochezia, jaundice or hemorrhoids Genitourinary: Denies dysuria, frequency, urgency, nocturia, hesitancy, discharge, hematuria or flank pain Musculoskeletal: Denies arthralgia, myalgia, stiffness, Jt. Swelling, pain, limp or strain/sprain. Denies Falls. Skin:  Denies puritis, rash, hives, warts, acne, eczema or change in skin lesion Neuro: No weakness, tremor, incoordination, spasms, paresthesia or pain Psychiatric: Denies confusion, memory loss or sensory loss. Denies Depression. Endocrine: Denies change in weight, skin, hair change, nocturia, and paresthesia, diabetic polys, visual blurring or hyper / hypo glycemic episodes.  Heme/Lymph: No excessive bleeding, bruising or enlarged lymph nodes.   Physical Exam  BP 140/78   Pulse 79   Temp 97.9 F (  36.6 C)   Resp 17   Ht 6' (1.829 m)   Wt 162 lb 3.2 oz (73.6 kg)   SpO2 98%   BMI 22.00 kg/m   General Appearance: Well nourished and well groomed and in no apparent distress.  Eyes: PERRLA, EOMs, conjunctiva no swelling or erythema, normal fundi and vessels. Sinuses: No frontal/maxillary tenderness ENT/Mouth: EACs patent / TMs  nl. Nares clear without erythema, swelling, mucoid exudates. Oral hygiene is good. No erythema, swelling, or exudate. Tongue normal, non-obstructing. Tonsils not swollen or erythematous. Hearing normal.  Neck: Supple, thyroid not palpable. No bruits, nodes or JVD. Respiratory: Respiratory effort normal.  BS equal and clear bilateral without rales, rhonci, wheezing or stridor. Cardio: Heart sounds are normal with regular rate and rhythm and no murmurs, rubs or gallops. Peripheral pulses are normal and equal bilaterally without edema. No aortic or femoral bruits. Chest: symmetric with normal excursions and percussion.  Abdomen: Soft, with Nl bowel sounds. Nontender, no guarding, rebound, hernias, masses, or organomegaly.  Lymphatics: Non tender without lymphadenopathy.  Musculoskeletal: Full ROM all peripheral extremities, joint stability, 5/5 strength, and normal gait. Skin: Warm and dry without rashes, lesions, cyanosis, clubbing or  ecchymosis.  Neuro: Cranial nerves intact, reflexes equal bilaterally. Normal muscle tone, no cerebellar symptoms. Sensation intact.   Pysch: Alert and oriented X 3 with normal affect, insight and judgment appropriate.   Assessment and Plan  1. Annual Preventative/Screening Exam    2. Essential hypertension  - EKG 12-Lead - [place], RETROPERITNL ABD,  LTD - Urinalysis, Routine w reflex microscopic - Microalbumin / creatinine urine ratio - CBC with Differential/Platelet - COMPLETE METABOLIC PANEL WITH GFR - Magnesium - TSH  3. Hyperlipidemia, mixed  - EKG 12-Lead - [place], RETROPERITNL ABD,  LTD - Lipid panel - TSH  4. Abnormal glucose  - EKG 12-Lead - [place], RETROPERITNL ABD,  LTD - Hemoglobin A1c - Insulin, random  5. Vitamin D deficiency  - VITAMIN D 25 Hydroxy   6. ASHD s/p Stent  - EKG 12-Lead - Lipid panel  7. Aortic atherosclerosis ([place]) by Chest CT scan on 10/27/2020  - EKG 12-Lead - Lipid panel  8. BPH with obstruction/lower urinary tract symptoms  - PSA  9. Prostate cancer screening  - PSA  10. Chronic obstructive pulmonary disease, HCC)  - Discouraged smoking & offered smoking cessation techniques   11. Screening for colorectal cancer  - POC Hemoccult Bld/Stl   12. Screening for ischemic heart disease  - EKG 12-Lead  13. FHx: heart disease  - EKG 12-Lead - [place], RETROPERITNL ABD,  LTD  14. Screening for AAA (aortic abdominal aneurysm)  - [place], RETROPERITNL ABD,  LTD  15. Smoker  - EKG 12-Lead - [place], RETROPERITNL ABD,  LTD  16. Medication management  - Urinalysis, Routine w reflex microscopic - Microalbumin / creatinine urine ratio - PSA - CBC with Differential/Platelet - COMPLETE METABOLIC PANEL WITH GFR - Magnesium - Lipid panel - TSH - Hemoglobin A1c - Insulin, random - VITAMIN D 25 Hydroxy           Patient was counseled in prudent diet, weight control to achieve/maintain BMI less than 25, BP monitoring, regular exercise and medications as discussed.  Discussed med effects and SE's. Routine screening labs and tests as requested with regular follow-up as  recommended. Over 40 minutes of exam, counseling, chart review and high complex critical decision making was performed   Kirtland Bouchard, MD

## 2021-05-04 ENCOUNTER — Other Ambulatory Visit: Payer: Self-pay

## 2021-05-04 ENCOUNTER — Ambulatory Visit (INDEPENDENT_AMBULATORY_CARE_PROVIDER_SITE_OTHER): Payer: Medicare PPO | Admitting: Internal Medicine

## 2021-05-04 ENCOUNTER — Encounter: Payer: Self-pay | Admitting: Internal Medicine

## 2021-05-04 VITALS — BP 140/78 | HR 79 | Temp 97.9°F | Resp 17 | Ht 72.0 in | Wt 162.2 lb

## 2021-05-04 DIAGNOSIS — R7309 Other abnormal glucose: Secondary | ICD-10-CM | POA: Diagnosis not present

## 2021-05-04 DIAGNOSIS — E559 Vitamin D deficiency, unspecified: Secondary | ICD-10-CM

## 2021-05-04 DIAGNOSIS — I2510 Atherosclerotic heart disease of native coronary artery without angina pectoris: Secondary | ICD-10-CM

## 2021-05-04 DIAGNOSIS — Z0000 Encounter for general adult medical examination without abnormal findings: Secondary | ICD-10-CM | POA: Diagnosis not present

## 2021-05-04 DIAGNOSIS — Z125 Encounter for screening for malignant neoplasm of prostate: Secondary | ICD-10-CM

## 2021-05-04 DIAGNOSIS — Z1211 Encounter for screening for malignant neoplasm of colon: Secondary | ICD-10-CM

## 2021-05-04 DIAGNOSIS — I700 Atherosclerosis of aorta: Secondary | ICD-10-CM

## 2021-05-04 DIAGNOSIS — Z79899 Other long term (current) drug therapy: Secondary | ICD-10-CM

## 2021-05-04 DIAGNOSIS — N401 Enlarged prostate with lower urinary tract symptoms: Secondary | ICD-10-CM | POA: Diagnosis not present

## 2021-05-04 DIAGNOSIS — F17200 Nicotine dependence, unspecified, uncomplicated: Secondary | ICD-10-CM

## 2021-05-04 DIAGNOSIS — E782 Mixed hyperlipidemia: Secondary | ICD-10-CM | POA: Diagnosis not present

## 2021-05-04 DIAGNOSIS — Z0001 Encounter for general adult medical examination with abnormal findings: Secondary | ICD-10-CM

## 2021-05-04 DIAGNOSIS — Z136 Encounter for screening for cardiovascular disorders: Secondary | ICD-10-CM

## 2021-05-04 DIAGNOSIS — Z8249 Family history of ischemic heart disease and other diseases of the circulatory system: Secondary | ICD-10-CM | POA: Diagnosis not present

## 2021-05-04 DIAGNOSIS — J449 Chronic obstructive pulmonary disease, unspecified: Secondary | ICD-10-CM

## 2021-05-04 DIAGNOSIS — N138 Other obstructive and reflux uropathy: Secondary | ICD-10-CM

## 2021-05-04 DIAGNOSIS — I10 Essential (primary) hypertension: Secondary | ICD-10-CM | POA: Diagnosis not present

## 2021-05-04 DIAGNOSIS — Z1212 Encounter for screening for malignant neoplasm of rectum: Secondary | ICD-10-CM

## 2021-05-05 LAB — COMPLETE METABOLIC PANEL WITH GFR
AG Ratio: 1.7 (calc) (ref 1.0–2.5)
ALT: 15 U/L (ref 9–46)
AST: 20 U/L (ref 10–35)
Albumin: 3.9 g/dL (ref 3.6–5.1)
Alkaline phosphatase (APISO): 97 U/L (ref 35–144)
BUN: 13 mg/dL (ref 7–25)
CO2: 27 mmol/L (ref 20–32)
Calcium: 8.9 mg/dL (ref 8.6–10.3)
Chloride: 105 mmol/L (ref 98–110)
Creat: 1.07 mg/dL (ref 0.70–1.28)
Globulin: 2.3 g/dL (calc) (ref 1.9–3.7)
Glucose, Bld: 89 mg/dL (ref 65–99)
Potassium: 4.3 mmol/L (ref 3.5–5.3)
Sodium: 141 mmol/L (ref 135–146)
Total Bilirubin: 0.6 mg/dL (ref 0.2–1.2)
Total Protein: 6.2 g/dL (ref 6.1–8.1)
eGFR: 74 mL/min/{1.73_m2} (ref >=60)

## 2021-05-05 LAB — CBC WITH DIFFERENTIAL/PLATELET
Absolute Monocytes: 630 cells/uL (ref 200–950)
Basophils Absolute: 90 cells/uL (ref 0–200)
Basophils Relative: 1 %
Eosinophils Absolute: 108 cells/uL (ref 15–500)
Eosinophils Relative: 1.2 %
HCT: 40.6 % (ref 38.5–50.0)
Hemoglobin: 13.7 g/dL (ref 13.2–17.1)
Lymphs Abs: 2520 cells/uL (ref 850–3900)
MCH: 31.8 pg (ref 27.0–33.0)
MCHC: 33.7 g/dL (ref 32.0–36.0)
MCV: 94.2 fL (ref 80.0–100.0)
MPV: 9.9 fL (ref 7.5–12.5)
Monocytes Relative: 7 %
Neutro Abs: 5652 cells/uL (ref 1500–7800)
Neutrophils Relative %: 62.8 %
Platelets: 146 10*3/uL (ref 140–400)
RBC: 4.31 10*6/uL (ref 4.20–5.80)
RDW: 12.7 % (ref 11.0–15.0)
Total Lymphocyte: 28 %
WBC: 9 10*3/uL (ref 3.8–10.8)

## 2021-05-05 LAB — INSULIN, RANDOM: Insulin: 11.4 u[IU]/mL

## 2021-05-05 LAB — LIPID PANEL
Cholesterol: 221 mg/dL — ABNORMAL HIGH (ref <200)
HDL: 42 mg/dL (ref >=40)
LDL Cholesterol (Calc): 124 mg/dL (calc) — ABNORMAL HIGH
Non-HDL Cholesterol (Calc): 179 mg/dL (calc) — ABNORMAL HIGH (ref <130)
Total CHOL/HDL Ratio: 5.3 (calc) — ABNORMAL HIGH (ref <5.0)
Triglycerides: 384 mg/dL — ABNORMAL HIGH (ref <150)

## 2021-05-05 LAB — MICROALBUMIN / CREATININE URINE RATIO
Creatinine, Urine: 41 mg/dL (ref 20–320)
Microalb, Ur: <0.2 mg/dL

## 2021-05-05 LAB — HEMOGLOBIN A1C
Hgb A1c MFr Bld: 5.5 % of total Hgb (ref <5.7)
Mean Plasma Glucose: 111 mg/dL
eAG (mmol/L): 6.2 mmol/L

## 2021-05-05 LAB — URINALYSIS, ROUTINE W REFLEX MICROSCOPIC
Bilirubin Urine: NEGATIVE
Glucose, UA: NEGATIVE
Hgb urine dipstick: NEGATIVE
Ketones, ur: NEGATIVE
Leukocytes,Ua: NEGATIVE
Nitrite: NEGATIVE
Protein, ur: NEGATIVE
Specific Gravity, Urine: 1.008 (ref 1.001–1.035)
pH: 7 (ref 5.0–8.0)

## 2021-05-05 LAB — MAGNESIUM: Magnesium: 2 mg/dL (ref 1.5–2.5)

## 2021-05-05 LAB — TSH: TSH: 2.02 mIU/L (ref 0.40–4.50)

## 2021-05-05 LAB — PSA: PSA: 7.22 ng/mL — ABNORMAL HIGH (ref <=4.00)

## 2021-05-05 LAB — VITAMIN D 25 HYDROXY (VIT D DEFICIENCY, FRACTURES): Vit D, 25-Hydroxy: 90 ng/mL (ref 30–100)

## 2021-05-05 NOTE — Progress Notes (Signed)
============================================================ °============================================================ ° °-    PSA = 7.22 stable over the last year  ( was 7.14 last year )  ============================================================ ============================================================  - Total Chol =  221     is very high risk for Heart Attack /Stroke /Vascular Dementia                          ( Ideal or Goal is less than 180 ! )  & - Bad /Dangerous LDL Chol =   124     - - >> Sitting on a time Bomb !                         ( Ideal or Goal is less than 70 ! )   - Treating with meds to lower Cholesterol is treating the result                                          & NOT treating the cause - The cause is Bad Diet !  - Read or listen to   Dr Wyman Songster 's book    " How Not to Die ! "   - Recommend a stricter plant based low cholesterol diet   - Cholesterol only comes from animal sources   - ie. meat, dairy, egg yolks  - Eat all the vegetables you want.  - Avoid meat, especially red meat - Beef AND Pork .  - Avoid cheese & dairy - milk & ice cream.    - Cheese is the most concentrated form of trans-fats which  is the worst thing to clog up our arteries.   - Veggie cheese is OK which can be found in the fresh  produce section at [employer] or [employer] or [employer] ============================================================ ============================================================  - As a last resort, you can take medicines if you refuse to improve your diet  ============================================================ ============================================================  - Also Triglycerides (   384  ) or fats in blood are too high                       (goal is less than 150)    - Recommend avoid fried & greasy foods,  sweets / candy,   - Avoid white  rice  (brown or wild rice or Quinoa is OK),   - Avoid white potatoes  (sweet potatoes are OK)   - Avoid anything made from white flour  - bagels, doughnuts, rolls, buns, biscuits, white and   wheat breads, pizza crust and traditional  pasta made of white flour & egg white  - (vegetarian pasta or spinach or wheat pasta is OK).    - Multi-grain bread is OK - like multi-grain flat bread or  sandwich thins.   - Avoid alcohol in excess.   - Exercise is also important. ============================================================ ============================================================  - Vitamin D = 90 - Excellent !  ============================================================ ============================================================  - All Else - CBC - Kidneys - Electrolytes - Liver - Magnesium & Thyroid    - all  Normal / OK ============================================================ ============================================================

## 2021-05-27 ENCOUNTER — Ambulatory Visit: Payer: Medicare PPO | Admitting: Nurse Practitioner

## 2021-05-27 ENCOUNTER — Encounter: Payer: Self-pay | Admitting: Nurse Practitioner

## 2021-05-27 ENCOUNTER — Other Ambulatory Visit: Payer: Self-pay

## 2021-05-27 VITALS — HR 68 | Temp 97.5°F

## 2021-05-27 DIAGNOSIS — U071 COVID-19: Secondary | ICD-10-CM | POA: Diagnosis not present

## 2021-05-27 DIAGNOSIS — R6889 Other general symptoms and signs: Secondary | ICD-10-CM

## 2021-05-27 DIAGNOSIS — Z1152 Encounter for screening for COVID-19: Secondary | ICD-10-CM | POA: Diagnosis not present

## 2021-05-27 LAB — POCT INFLUENZA A/B
Influenza A, POC: NEGATIVE
Influenza B, POC: NEGATIVE

## 2021-05-27 LAB — POC COVID19 BINAXNOW: SARS Coronavirus 2 Ag: POSITIVE — AB

## 2021-05-27 MED ORDER — BENZONATATE 100 MG PO CAPS: 100.0000 mg | ORAL_CAPSULE | Freq: Four times a day (QID) | ORAL | 1 refills | Status: DC | PRN

## 2021-05-27 MED ORDER — DEXAMETHASONE 1 MG PO TABS: ORAL_TABLET | ORAL | 0 refills | Status: DC

## 2021-05-27 MED ORDER — AZITHROMYCIN 250 MG PO TABS: ORAL_TABLET | ORAL | 1 refills | Status: DC

## 2021-05-27 NOTE — Progress Notes (Signed)
THIS ENCOUNTER IS A VIRTUAL VISIT DUE TO COVID-19 - PATIENT WAS NOT SEEN IN THE OFFICE.  PATIENT HAS CONSENTED TO VIRTUAL VISIT / TELEMEDICINE VISIT   Virtual Visit via telephone Note  I connected with  Michael [name] on 05/27/2021 by telephone.  I verified that I am speaking with the correct person using two identifiers.    I discussed the limitations of evaluation and management by telemedicine and the availability of in person appointments. The patient expressed understanding and agreed to proceed.  History of Present Illness:  Pulse 68    Temp (!) 97.5 F (36.4 C)    SpO2 97%  71 y.o. patient contacted office reporting URI sx . he tested positive by test in office parking lot. OV was conducted by telephone to minimize exposure. This patient was not  vaccinated for covid 19  Sx began 4 days ago with productive cough  Treatments tried so far: Tylenol and Mucinex  Exposures: unknown   Medications   Current Outpatient Medications (Cardiovascular):    bisoprolol-hydrochlorothiazide (ZIAC) 10-6.25 MG tablet, TAKE 1 TABLET EVERY DAY FOR BLOOD PRESSURE   rosuvastatin (CRESTOR) 10 MG tablet, Take  1 tablet  Daily  for Cholesterol (Patient not taking: Reported on 05/04/2021)  Current Outpatient Medications (Respiratory):    albuterol (VENTOLIN HFA) 108 (90 Base) MCG/ACT inhaler, Inhale 1-2 puffs into the lungs every 6 (six) hours as needed for wheezing or shortness of breath.   TRELEGY ELLIPTA 100-62.5-25 MCG/INH AEPB, INHALE 1 PUFF INTO THE LUNGS DAILY.  Current Outpatient Medications (Analgesics):    aspirin EC 81 MG tablet, Take 81 mg by mouth daily.   Current Outpatient Medications (Other):    b complex vitamins capsule, Take 1 capsule by mouth daily.   cholecalciferol (VITAMIN D) 1000 UNITS tablet, 2,000 Units in the morning, at noon, and at bedtime. 4000 total a day   Magnesium 250 MG TABS, Take by mouth every other day.   Multiple Vitamin (MULTIVITAMIN) tablet, Take 1  tablet by mouth daily. Takes a vitamin pack daily   saw palmetto 500 MG capsule, Take 500 mg by mouth daily.  Allergies:  Allergies  Allergen Reactions   Ace Inhibitors Cough   Ciprofloxacin    Fenofibrate     dizzy    Problem list He has BPH with obstruction/lower urinary tract symptoms; Vitamin D deficiency; Chronic obstructive pulmonary disease ([place]); Essential hypertension; Hyperlipidemia, mixed; Abnormal glucose; Smoker; ASHD s/p Stent; History of amputation of finger of left hand; Elevated PSA; Aortic atherosclerosis ([facility]) by Chest CT scan on 10/27/2020; FHx: heart disease; Pulmonary nodules; History of colonic polyps (hyperplastic x 3 in 2014); and Screening for colorectal cancer on their problem list.   Social History:   reports that he has been smoking cigarettes. He started smoking about 58 years ago. He has a 45.00 pack-year smoking history. He has never used smokeless tobacco. He reports current alcohol use of about 5.0 standard drinks per week. He reports that he does not use drugs.  Observations/Objective:  General : Well sounding patient in no apparent distress HEENT: no hoarseness, no cough for duration of visit Lungs: speaks in complete sentences, no audible wheezing, no apparent distress Neurological: alert, oriented x 3 Psychiatric: pleasant, judgement appropriate   Assessment and Plan:  Michael [name] was seen today for acute visit.  Diagnoses and all orders for this visit:  Flu-like symptoms -     POCT Influenza A/B Negative  Encounter for screening for COVID-19 -     POC  COVID-19  COVID-19 -     dexamethasone (DECADRON) 1 MG tablet; Take 3 tabs for 3 days, 2 tabs for 3 days 1 tab for 5 days. Take with food. -     benzonatate (TESSALON PERLES) 100 MG capsule; Take 1 capsule (100 mg total) by mouth every 6 (six) hours as needed for cough. -     azithromycin (ZITHROMAX) 250 MG tablet; Take 2 tablets (500 mg) on  Day 1,  followed by 1 tablet (250 mg) once daily on  Days 2 through 5.    Covid 19 positive per rapid screening test in parking lot of office today Risk factors include: Hypertension, ASHD, COPD, Smoker Symptoms are: mild Immue support reviewed  Take tylenol PRN temp 101+ Push hydration Regular ambulation or calf exercises exercises for clot prevention and 81 mg ASA unless contraindicated Sx supportive therapy suggested Follow up via mychart or telephone if needed Advised patient obtain O2 monitor; present to ED if persistently <90% or with severe dyspnea, CP, fever uncontrolled by tylenol, confusion, sudden decline Should remain in isolation until at least 5 days from onset of sx, 24-48 hours fever free without tylenol, sx such as cough are improved.      Follow Up Instructions:  I discussed the assessment and treatment plan with the patient. The patient was provided an opportunity to ask questions and all were answered. The patient agreed with the plan and demonstrated an understanding of the instructions.   The patient was advised to call back or seek an in-person evaluation if the symptoms worsen or if the condition fails to improve as anticipated.  I provided 20  minutes of non-face-to-face time during this encounter.   Magda Bernheim, NP

## 2021-06-22 DIAGNOSIS — T1502XA Foreign body in cornea, left eye, initial encounter: Secondary | ICD-10-CM | POA: Diagnosis not present

## 2021-06-23 DIAGNOSIS — T1502XD Foreign body in cornea, left eye, subsequent encounter: Secondary | ICD-10-CM | POA: Diagnosis not present

## 2021-08-07 ENCOUNTER — Ambulatory Visit (INDEPENDENT_AMBULATORY_CARE_PROVIDER_SITE_OTHER): Payer: Medicare PPO | Admitting: Adult Health

## 2021-08-07 ENCOUNTER — Other Ambulatory Visit: Payer: Self-pay

## 2021-08-07 ENCOUNTER — Encounter: Payer: Self-pay | Admitting: Adult Health

## 2021-08-07 VITALS — BP 140/72 | HR 61 | Temp 97.9°F | Wt 168.0 lb

## 2021-08-07 DIAGNOSIS — R399 Unspecified symptoms and signs involving the genitourinary system: Secondary | ICD-10-CM

## 2021-08-07 DIAGNOSIS — I10 Essential (primary) hypertension: Secondary | ICD-10-CM

## 2021-08-07 DIAGNOSIS — F17200 Nicotine dependence, unspecified, uncomplicated: Secondary | ICD-10-CM

## 2021-08-07 DIAGNOSIS — R972 Elevated prostate specific antigen [PSA]: Secondary | ICD-10-CM | POA: Diagnosis not present

## 2021-08-07 DIAGNOSIS — Z23 Encounter for immunization: Secondary | ICD-10-CM

## 2021-08-07 DIAGNOSIS — J449 Chronic obstructive pulmonary disease, unspecified: Secondary | ICD-10-CM | POA: Diagnosis not present

## 2021-08-07 DIAGNOSIS — Z860100 Personal history of colon polyps, unspecified: Secondary | ICD-10-CM

## 2021-08-07 DIAGNOSIS — Z89022 Acquired absence of left finger(s): Secondary | ICD-10-CM

## 2021-08-07 DIAGNOSIS — I2510 Atherosclerotic heart disease of native coronary artery without angina pectoris: Secondary | ICD-10-CM

## 2021-08-07 DIAGNOSIS — R6889 Other general symptoms and signs: Secondary | ICD-10-CM

## 2021-08-07 DIAGNOSIS — E782 Mixed hyperlipidemia: Secondary | ICD-10-CM

## 2021-08-07 DIAGNOSIS — R7309 Other abnormal glucose: Secondary | ICD-10-CM

## 2021-08-07 DIAGNOSIS — I700 Atherosclerosis of aorta: Secondary | ICD-10-CM

## 2021-08-07 DIAGNOSIS — Z8249 Family history of ischemic heart disease and other diseases of the circulatory system: Secondary | ICD-10-CM

## 2021-08-07 DIAGNOSIS — S61213A Laceration without foreign body of left middle finger without damage to nail, initial encounter: Secondary | ICD-10-CM

## 2021-08-07 DIAGNOSIS — Z0000 Encounter for general adult medical examination without abnormal findings: Secondary | ICD-10-CM

## 2021-08-07 DIAGNOSIS — Z0001 Encounter for general adult medical examination with abnormal findings: Secondary | ICD-10-CM | POA: Diagnosis not present

## 2021-08-07 DIAGNOSIS — N138 Other obstructive and reflux uropathy: Secondary | ICD-10-CM

## 2021-08-07 DIAGNOSIS — E559 Vitamin D deficiency, unspecified: Secondary | ICD-10-CM

## 2021-08-07 DIAGNOSIS — Z79899 Other long term (current) drug therapy: Secondary | ICD-10-CM | POA: Diagnosis not present

## 2021-08-07 MED ORDER — ROSUVASTATIN CALCIUM 10 MG PO TABS: ORAL_TABLET | ORAL | 3 refills | Status: DC

## 2021-08-07 NOTE — Progress Notes (Signed)
MEDICARE ANNUAL WELLNESS VISIT AND FOLLOW UP ? ? ?Assessment:  ? ?[name] was seen today for follow-up and medicare wellness. ? ?Diagnoses and all orders for this visit: ? ?Annual Medicare Wellness Visit ?Due annually  ?Health maintenance reviewed ? ?Aortic atherosclerosis ([place]) ?Per CT 10/2019 and numerous others ?Control blood pressure, cholesterol, glucose, increase exercise.  ? ?Chronic obstructive pulmonary disease, unspecified COPD type ([place]) ?Quit smoking, getting CT screening, improved with trellegy but with breakthrough; emphasized smoking reduction; given breztri inhaler sample x 1 week to try ? ?Pulmonary nodules ?Follow up routine screening Ct is due 01/2022, order at CPE ?Advised to quit smoking ? ?Smoker ?Discussed risks associated with tobacco use and advised to reduce or quit ?Patient is not ready to do so, but advised to consider strongly ?Will follow up at the next visit ? ?Essential hypertension ?Continue medication ?Monitor blood pressure at home; call if consistently over 130/80 ?Continue DASH diet.   ?Reminder to go to the ER if any CP, SOB, nausea, dizziness, severe HA, changes vision/speech, left arm numbness and tingling and jaw pain. ? ?CAD ?Control blood pressure, cholesterol, glucose, increase exercise.  ?Not following with cardiology consistently;  ?Denies angina ? ?BPH with obstruction/lower urinary tract symptoms ?Monitor annual PSA; having more slow stream; patient preference for referral [facility] to [facility], placed ? ?Elevated PSA ?Continue annual monitoring; had neg biopsy x 3 by Dr. Kellie Simmering ? ?Vitamin D deficiency ?At goal at recent check; continue to recommend supplementation for goal of 60-100 ?Defer vitamin D level ? ?Hyperlipidemia, mixed ?Has been declining medications ?Extended discussion of his risks and benefits of statins including stabilizing existing plaque; he is receptive to starting slow taper with rosuvastatin. Start once weekly and slow taper up to 3 days a week  prior to next visit. Contact office with any SE.  ?Continue low cholesterol diet and exercise.  ?Check lipid panel.  ?-     Lipid panel ?-     TSH ? ?BMI 23.0-23.9, adult ?Discussed dietary and exercise modifications ? ?History of colon polyps ?5 year follow up recommended by Dr. Audie Box due 12/2023 ? ?Laceration/ Need for tetanus booster ?- Td administered without complication.  ? ? ?Orders Placed This Encounter  ?Procedures  ? Td : Tetanus/diphtheria >7yo Preservative  free  ? CBC with Differential/Platelet  ? COMPLETE METABOLIC PANEL WITH GFR  ? Magnesium  ? Lipid panel  ? TSH  ? Ambulatory referral to Urology  ? ? ?Further disposition pending results if labs check today. Discussed med's effects and SE's.   ?Over 30 minutes of face to face interview, exam, counseling, chart review, and critical decision making was performed.  ? ? ?Future Appointments  ?Date Time Provider [place]  ?11/12/2021  9:30 AM Unk Pinto, MD [facility] None  ?08/11/2022 10:00 AM Liane Comber, NP [facility] None  ? ? ? ?Plan:  ? ?During the course of the visit the patient was educated and counseled about appropriate screening and preventive services including:  ? ?Pneumococcal vaccine  ?Influenza vaccine ?Prevnar 13 ?Td vaccine ?Screening electrocardiogram ?Colorectal cancer screening ?Diabetes screening ?Glaucoma screening ?Nutrition counseling  ? ? ?Subjective:  ?Michael [name] is a 71 y.o. male who presents for Medicare Annual Wellness Visit and 3 month follow up for HTN,HLD, glucose management, and vitamin D Def.  ? ?He currently continues to smoke 1 pack a day; discussed risks associated with smoking, patient is not ready to quit, but does want to try to reduce, has tried patches, not interested in  wellbutrin or chantix. He had benign low dose lung CT screening on 01/28/2021.  ? ?He has COPD and using trellegy with noticeable benefit but recently having more exertional dyspnea, also taking mucinex. Denies  CP/dizziness/fatigue.  ? 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  He reports he gets scratched by steel constantly as a Dealer and while working, had recent to finger.  Will give tetanus vaccination today. ? ?BMI is Body mass index is 22.78 kg/m?., he has been working on diet and exercise, still works, Dealer and farm work, very active.  ?Wt Readings from Last 3 Encounters:  ?08/07/21 168 lb (76.2 kg)  ?05/04/21 162 lb 3.2 oz (73.6 kg)  ?12/10/20 165 lb 9.6 oz (75.1 kg)  ? 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 He is established with 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 Recent CTs have demonstrated aortic atherosclerosis and 3 vessel CAD.  ? ?His blood pressure has been controlled at home, today their BP is BP: 140/72 ?He does workout. He denies chest pain, dizziness. More exertional dyspnea in the last year with brisk walking or heaving lifting.  ? ?He is not on cholesterol medication (rosuvastatin 10 mg was prescribed but never started, extended discussion today, denies hx of intolerance) His cholesterol is not at goal. The cholesterol last visit was:   ?Lab Results  ?Component Value Date  ? CHOL 238 (H) 08/07/2021  ? HDL 48 08/07/2021  ? LDLCALC 145 (H) 08/07/2021  ? TRIG 275 (H) 08/07/2021  ? CHOLHDL 5.0 (H) 08/07/2021  ? ?He has been working on diet and exercise for glucose management, and denies increased appetite, nausea, paresthesia of the feet, polydipsia, polyuria and visual disturbances. Last A1C in the office was:  ?Lab Results  ?Component Value Date  ? HGBA1C 5.5 05/04/2021  ? ?Last GFR ?Lab Results  ?Component Value Date  ? GFRNONAA 80 08/07/2020  ? ?Patient is on Vitamin D supplement.   ?Lab Results  ?Component Value Date  ? VD25OH 90 05/04/2021  ?   ?  He is previously established with [name] for BPH/elevated PSAs which have been  stable- negative biopsy x3 in the past, now taking saw palmetto with improvement but patient notes increasing slow stream, req referral back  ?Lab Results  ?Component Value Date  ? PSA 7.22 (H) 05/04/2021  ? PSA 7.14 (H) 05/01/2020  ? PSA 5.6 (H) 03/13/2019  ? ? ? ?Medication Review: ? ? ?Current Outpatient Medications (Cardiovascular):  ?  bisoprolol-hydrochlorothiazide (ZIAC) 10-6.25 MG tablet, TAKE 1 TABLET EVERY DAY FOR BLOOD PRESSURE ?  rosuvastatin (CRESTOR) 10 MG tablet, Take  1 tablet 3 days a week ([facility]) for Cholesterol ? ?Current Outpatient Medications (Respiratory):  ?  albuterol (VENTOLIN HFA) 108 (90 Base) MCG/ACT inhaler, Inhale 1-2 puffs into the lungs every 6 (six) hours as needed for wheezing or shortness of breath. ?  Budeson-Glycopyrrol-Formoterol (BREZTRI AEROSPHERE) 160-9-4.8 MCG/ACT AERO, Inhale 2 puffs into the lungs in the morning and at bedtime. Brush teeth and gargle after each use to avoid thrush. ?  Pseudoephedrine-guaiFENesin (MUCINEX D PO), Take by mouth. ?  TRELEGY ELLIPTA 100-62.5-25 MCG/INH AEPB, INHALE 1 PUFF INTO THE LUNGS DAILY. ? ?Current Outpatient Medications (Analgesics):  ?  acetaminophen (TYLENOL) 500 MG tablet, Take 500 mg by mouth every 6 (six) hours as needed. ?  aspirin EC 81 MG tablet, Take 81 mg by mouth daily. ? ? ?Current Outpatient Medications (Other):  ?  b complex vitamins capsule, Take 1 capsule by mouth daily. ?  cholecalciferol (VITAMIN D) 1000 UNITS tablet, 2,000 Units in the morning, at noon, and at bedtime. 4000 total a day ?  Magnesium 250 MG TABS, Take by mouth every other day. ?  Multiple Vitamin (MULTIVITAMIN) tablet, Take 1 tablet by mouth daily. Takes a vitamin pack daily ?  saw palmetto 500 MG capsule, Take 500 mg by mouth daily. ? ?Allergies: ?Allergies  ?Allergen Reactions  ? Ace Inhibitors Cough  ? Ciprofloxacin   ? Fenofibrate   ?  dizzy  ? ? ?Current Problems (verified) ?has BPH with obstruction/lower urinary tract symptoms; Vitamin D deficiency;  Chronic obstructive pulmonary disease ([place]); Essential hypertension; Hyperlipidemia, mixed; Abnormal glucose; Smoker; CAD (coronary artery disease); History of amputation of finger of left hand; Elevated PSA; Aortic atheros

## 2021-08-08 ENCOUNTER — Encounter: Payer: Self-pay | Admitting: Adult Health

## 2021-08-08 LAB — COMPLETE METABOLIC PANEL WITH GFR
AG Ratio: 1.7 (calc) (ref 1.0–2.5)
ALT: 15 U/L (ref 9–46)
AST: 18 U/L (ref 10–35)
Albumin: 4.1 g/dL (ref 3.6–5.1)
Alkaline phosphatase (APISO): 96 U/L (ref 35–144)
BUN: 16 mg/dL (ref 7–25)
CO2: 28 mmol/L (ref 20–32)
Calcium: 9.6 mg/dL (ref 8.6–10.3)
Chloride: 104 mmol/L (ref 98–110)
Creat: 0.95 mg/dL (ref 0.70–1.28)
Globulin: 2.4 g/dL (calc) (ref 1.9–3.7)
Glucose, Bld: 86 mg/dL (ref 65–99)
Potassium: 4.5 mmol/L (ref 3.5–5.3)
Sodium: 141 mmol/L (ref 135–146)
Total Bilirubin: 0.8 mg/dL (ref 0.2–1.2)
Total Protein: 6.5 g/dL (ref 6.1–8.1)
eGFR: 85 mL/min/{1.73_m2} (ref >=60)

## 2021-08-08 LAB — CBC WITH DIFFERENTIAL/PLATELET
Absolute Monocytes: 774 cells/uL (ref 200–950)
Basophils Absolute: 116 cells/uL (ref 0–200)
Basophils Relative: 1.3 %
Eosinophils Absolute: 116 cells/uL (ref 15–500)
Eosinophils Relative: 1.3 %
HCT: 42.3 % (ref 38.5–50.0)
Hemoglobin: 14.5 g/dL (ref 13.2–17.1)
Lymphs Abs: 2581 cells/uL (ref 850–3900)
MCH: 32.2 pg (ref 27.0–33.0)
MCHC: 34.3 g/dL (ref 32.0–36.0)
MCV: 93.8 fL (ref 80.0–100.0)
MPV: 10.1 fL (ref 7.5–12.5)
Monocytes Relative: 8.7 %
Neutro Abs: 5313 cells/uL (ref 1500–7800)
Neutrophils Relative %: 59.7 %
Platelets: 198 10*3/uL (ref 140–400)
RBC: 4.51 10*6/uL (ref 4.20–5.80)
RDW: 13.2 % (ref 11.0–15.0)
Total Lymphocyte: 29 %
WBC: 8.9 10*3/uL (ref 3.8–10.8)

## 2021-08-08 LAB — MAGNESIUM: Magnesium: 2.2 mg/dL (ref 1.5–2.5)

## 2021-08-08 LAB — LIPID PANEL
Cholesterol: 238 mg/dL — ABNORMAL HIGH (ref <200)
HDL: 48 mg/dL (ref >=40)
LDL Cholesterol (Calc): 145 mg/dL (calc) — ABNORMAL HIGH
Non-HDL Cholesterol (Calc): 190 mg/dL (calc) — ABNORMAL HIGH (ref <130)
Total CHOL/HDL Ratio: 5 (calc) — ABNORMAL HIGH (ref <5.0)
Triglycerides: 275 mg/dL — ABNORMAL HIGH (ref <150)

## 2021-08-08 LAB — TSH: TSH: 1.97 mIU/L (ref 0.40–4.50)

## 2021-08-08 MED ORDER — BREZTRI AEROSPHERE 160-9-4.8 MCG/ACT IN AERO: 2.0000 | INHALATION_SPRAY | Freq: Two times a day (BID) | RESPIRATORY_TRACT | 0 refills | Status: DC

## 2021-08-11 ENCOUNTER — Other Ambulatory Visit: Payer: Self-pay | Admitting: Adult Health

## 2021-08-11 ENCOUNTER — Telehealth: Payer: Self-pay

## 2021-08-11 DIAGNOSIS — E782 Mixed hyperlipidemia: Secondary | ICD-10-CM

## 2021-08-11 MED ORDER — ROSUVASTATIN CALCIUM 10 MG PO TABS: ORAL_TABLET | ORAL | 3 refills | Status: DC

## 2021-08-11 NOTE — Telephone Encounter (Signed)
Patient needs a prescription for Rosuvastatin.  ?

## 2021-08-27 ENCOUNTER — Ambulatory Visit (INDEPENDENT_AMBULATORY_CARE_PROVIDER_SITE_OTHER): Payer: Medicare PPO | Admitting: Urology

## 2021-08-27 ENCOUNTER — Encounter: Payer: Self-pay | Admitting: Urology

## 2021-08-27 VITALS — BP 115/67 | HR 61

## 2021-08-27 DIAGNOSIS — N401 Enlarged prostate with lower urinary tract symptoms: Secondary | ICD-10-CM | POA: Diagnosis not present

## 2021-08-27 DIAGNOSIS — N138 Other obstructive and reflux uropathy: Secondary | ICD-10-CM

## 2021-08-27 DIAGNOSIS — R972 Elevated prostate specific antigen [PSA]: Secondary | ICD-10-CM | POA: Diagnosis not present

## 2021-08-27 LAB — URINALYSIS, ROUTINE W REFLEX MICROSCOPIC
Bilirubin, UA: NEGATIVE
Glucose, UA: NEGATIVE
Ketones, UA: NEGATIVE
Leukocytes,UA: NEGATIVE
Nitrite, UA: NEGATIVE
Protein,UA: NEGATIVE
RBC, UA: NEGATIVE
Specific Gravity, UA: 1.025 (ref 1.005–1.030)
Urobilinogen, Ur: 1 mg/dL (ref 0.2–1.0)
pH, UA: 6.5 (ref 5.0–7.5)

## 2021-08-27 NOTE — Progress Notes (Signed)
? ?Assessment: ?1. Elevated PSA   ?2. BPH with obstruction/lower urinary tract symptoms   ? ? ?Plan: ?Today I had a long discussion with the patient regarding PSA and the rationale and controversies of prostate cancer early detection.  I discussed the pros and cons of further evaluation including TRUS and prostate Bx.  Potential adverse events and complications as well as standard instructions were given.  Patient expressed his understanding of these issues.  I also discussed further evaluation with additional laboratory testing including PSA based test such as the 4K score, urine based test such as ExoDx and imaging with prostate MRI. ?I discussed diagnosis and management of BPH with lower urinary tract symptoms.  Specifically I discussed the role of medical therapy and surgical therapy.  He does not wish to pursue any treatment at this time.  He would like to monitor his symptoms. ?Free and total PSA in 3 months ?Return to office in 6 months ? ?Chief Complaint:  ?Chief Complaint  ?Patient presents with  ? Elevated PSA  ? ? ?History of Present Illness: ? ?Michael [name] is a 71 y.o. year old male who is seen in consultation from Liane Comber, NP for evaluation of elevated PSA. ? ?PSA results: ?9/18 7.2 ?10/19 6.9 ?10/20 5.6 ?12/21 7.14 ?12/22 7.22 ? ?He was previously followed by [name] in [place].  He has had 3 prostate biopsies, all negative by patient report.  He reports that his PSA has been as high as 12.  No history of UTIs or prostatitis. ?He does have lower urinary tract symptoms including frequency, urgency, nocturia 3-4 times, intermittent stream, and decreased force of stream.  No dysuria or gross hematuria. ?IPSS = 17 today. ? ? ?Past Medical History:  ?Past Medical History:  ?Diagnosis Date  ? BPH (benign prostatic hyperplasia)   ? COPD (chronic obstructive pulmonary disease) ([place])   ? via CT lung  ? GERD (gastroesophageal reflux disease)   ? Hyperlipidemia   ? Hypertension   ? Hypogonadism  male   ? Myocardial infarction [facility]) 2005  ? Per patient no stent or PTCA, just medical treatment  ? Prediabetes   ? Vitamin D deficiency   ? ? ?Past Surgical History:  ?Past Surgical History:  ?Procedure Laterality Date  ? AMPUTATION FINGER / THUMB Left 1991  ? distal index  ? CARDIAC CATHETERIZATION N/A 2005  ? Per patient no stent or PTCA, just medical treatment  ? CATARACT EXTRACTION, BILATERAL Bilateral 2014  ? Dr. Talbert Forest   ? colonoscopy N/A 10/2012  ? Neg and recc f/u Cololnoscopy in 5 yeary - Dr Nicki Reaper [place] - Gastroenterologist  ? [place]    ? Biopsy X 3  ? VASECTOMY    ? ? ?Allergies:  ?Allergies  ?Allergen Reactions  ? Ace Inhibitors Cough  ? Ciprofloxacin   ? Fenofibrate   ?  dizzy  ? ? ?Family History:  ?Family History  ?Problem Relation Age of Onset  ? Cancer Father 69  ?     lymphoma  ? Cancer Maternal Aunt   ?     Lung  ? Cancer Maternal Uncle   ?     Lung  ? Cancer Maternal Uncle   ?     Lung  ? Stroke Maternal Grandmother   ? Heart failure Maternal Grandfather   ? Pneumonia Paternal Grandmother   ? Liver disease Paternal Grandfather   ? Stroke Son 4  ? Other Son   ?     internal  intestinal hemorage  ? ? ?Social History:  ?Social History  ? ?Tobacco Use  ? Smoking status: Every Day  ?  Packs/day: 1.00  ?  Years: 45.00  ?  Pack years: 45.00  ?  Types: Cigarettes  ?  Start date: 74  ? Smokeless tobacco: Never  ?Substance Use Topics  ? Alcohol use: Yes  ?  Alcohol/week: 5.0 standard drinks  ?  Types: 5 Cans of beer per week  ? Drug use: No  ? ? ?Review of symptoms:  ?Constitutional:  Negative for unexplained weight loss, night sweats, fever, chills ?ENT:  Negative for nose bleeds, sinus pain, painful swallowing ?CV:  Negative for chest pain, shortness of breath, exercise intolerance, palpitations, loss of consciousness ?Resp:  Negative for cough, wheezing, shortness of breath ?GI:  Negative for nausea, vomiting, diarrhea, bloody stools ?GU:  Positives noted in HPI; otherwise negative for  gross hematuria, dysuria, urinary incontinence ?Neuro:  Negative for seizures, poor balance, limb weakness, slurred speech ?Psych:  Negative for lack of energy, depression, anxiety ?Endocrine:  Negative for polydipsia, polyuria, symptoms of hypoglycemia (dizziness, hunger, sweating) ?Hematologic:  Negative for anemia, purpura, petechia, prolonged or excessive bleeding, use of anticoagulants  ?Allergic:  Negative for difficulty breathing or choking as a result of exposure to anything; no shellfish allergy; no allergic response (rash/itch) to materials, foods ? ?Physical exam: ?BP 115/67   Pulse 61  ?GENERAL APPEARANCE:  Well appearing, well developed, well nourished, NAD ?HEENT: Atraumatic, Normocephalic, oropharynx clear. ?NECK: Supple without lymphadenopathy or thyromegaly. ?LUNGS: Clear to auscultation bilaterally. ?HEART: Regular Rate and Rhythm without murmurs, gallops, or rubs. ?ABDOMEN: Soft, non-tender, No Masses. ?EXTREMITIES: Moves all extremities well.  Without clubbing, cyanosis, or edema. ?NEUROLOGIC:  Alert and oriented x 3, normal gait, CN II-XII grossly intact.  ?MENTAL STATUS:  Appropriate. ?BACK:  Non-tender to palpation.  No CVAT ?SKIN:  Warm, dry and intact.   ?GU: ?Penis:  circumcised ?Meatus: Normal ?Scrotum: normal, no masses ?Testis: normal without masses bilateral ?Epididymis: normal ?Prostate: 40 g, NT, no nodules ?Rectum: Normal tone,  no masses or tenderness ? ? ?Results: ?U/A:  dipstick negative ? ?PVR:  30 ml ? ?

## 2021-08-27 NOTE — Progress Notes (Signed)
post void residual=30 

## 2021-10-29 ENCOUNTER — Ambulatory Visit (HOSPITAL_COMMUNITY)
Admission: RE | Admit: 2021-10-29 | Discharge: 2021-10-29 | Disposition: A | Discharge status: Home / Self Care | Payer: Medicare PPO | Source: Ambulatory Visit | Attending: Internal Medicine | Admitting: Internal Medicine

## 2021-10-29 DIAGNOSIS — I6523 Occlusion and stenosis of bilateral carotid arteries: Secondary | ICD-10-CM | POA: Diagnosis not present

## 2021-11-12 ENCOUNTER — Ambulatory Visit: Payer: Medicare PPO | Admitting: Internal Medicine

## 2021-11-18 NOTE — Progress Notes (Incomplete)
Future Appointments  Date Time Provider Department  11/19/2021  4:00 PM Unk Pinto, MD [facility]  02/25/2022 11:30 AM Felipa Eth, Reece Leader., MD [facility]  05/04/2022 11:00 AM Unk Pinto, MD [facility]  08/11/2022 10:00 AM Darrol Jump, NP [facility]     History of Present Illness:        This very nice 71 y.o. [name] presents for 6 month follow up with HTN, HLD, ASCAD, Pre-Diabetes and Vitamin D Deficiency.  Chest CTscan on 10/27/2020 showed Aortic Atherosclerosis. Patient has rece nt elevated PSA being followed in [place] by Dr Michaelle Birks.                                                           Chest CT scan also showed new pulm nodules (<7.0 mm) in the LUL and Radiologist recommended repeating CT Lung scan in 3 months (due ~ mid Sept).  Patient does endorse chronic exertional dyspnea, but denies any hemoptysis or significant sputum production.       Patient is treated for HTN & BP has been controlled at home. Today's BP is at goal - 140/77. Patient has had no complaints of any cardiac type chest pain, palpitations, dyspnea / orthopnea / PND, dizziness, claudication, or dependent edema.       Hyperlipidemia is controlled with diet & meds. Patient denies myalgias or other med SE's. Last Lipids were not at goal:  Lab Results  Component Value Date   CHOL 219 (H) 08/07/2020   HDL 45 08/07/2020   LDLCALC 142 (H) 08/07/2020   TRIG 181 (H) 08/07/2020   CHOLHDL 4.9 08/07/2020     Also, the patient has history of PreDiabetes and has had no symptoms of reactive hypoglycemia, diabetic polys, paresthesias or visual blurring.  Last A1c was normal & at goal:  Lab Results  Component Value Date   HGBA1C 5.5 05/01/2020        Further, the patient also has history of Vitamin D Deficiency and supplements vitamin D without any suspected side-effects. Last vitamin D was   Lab Results  Component Value Date   VD25OH 97 05/01/2020     Current Outpatient  Medications on File Prior to Visit  Medication Sig  . albuterol (VENTOLIN HFA) 108 (90 Base) MCG/ACT inhaler Inhale 1-2 puffs into the lungs every 6 (six) hours as needed for wheezing or shortness of breath.  Marland Kitchen aspirin EC 81 MG tablet Take 81 mg by mouth daily.  Marland Kitchen b complex vitamins capsule Take 1 capsule by mouth daily.  . bisoprolol-hctz  0-6.Michael MG tablet TAKE 1 TABLET EVERY DAY FOR BLOOD PRESSURE  . VITAMIN D 1000 UNITS tablet 2,000 Units in the morning, at noon, and at bedtime. 6000 total a day  . Magnesium 250 MG TABS Take by mouth every other day.  . Multiple Vitamin  Take 1 tablet by mouth daily. Takes a vitamin pack daily  . saw palmetto 500 MG capsule Take 500 mg by mouth daily.  . TRELEGY ELLIPTA 100-62.5-Michael  INHALE 1 PUFF DAILY.    Allergies  Allergen Reactions  . Ace Inhibitors Cough  . Ciprofloxacin   . Fenofibrate     dizzy     PMHx:   Past Medical History:  Diagnosis Date  . BPH (benign prostatic hyperplasia)   . COPD (chronic  obstructive pulmonary disease) ([place])    via CT lung  . GERD (gastroesophageal reflux disease)   . Hyperlipidemia   . Hypertension   . Hypogonadism male   . Myocardial infarction [facility]) 2005   Per patient no stent or PTCA, just medical treatment  . Prediabetes   . Vitamin D deficiency      Immunization History  Administered Date(s) Administered  . PPD Test 12/25/2013  . Pneumococcal Conjugate-13 02/28/2018  . Pneumococcal-Unspecified 12/08/2010  . Tdap 12/08/2010     Past Surgical History:  Procedure Laterality Date  . AMPUTATION FINGER / THUMB Left 1991   distal index  . CARDIAC CATHETERIZATION N/A 2005   Per patient no stent or PTCA, just medical treatment  . CATARACT EXTRACTION, BILATERAL Bilateral 2014   Dr. Talbert Forest   . colonoscopy N/A 10/2012   Neg and recc f/u Cololnoscopy in 5 yeary - Dr Nicki Reaper [place] - Gastroenterologist  . PROSTATE SURGERY     Biopsy X 3  . VASECTOMY      FHx:    Reviewed / unchanged  SHx:     Reviewed / unchanged   Systems Review:  Constitutional: Denies fever, chills, wt changes, headaches, insomnia, fatigue, night sweats, change in appetite. Eyes: Denies redness, blurred vision, diplopia, discharge, itchy, watery eyes.  ENT: Denies discharge, congestion, post nasal drip, epistaxis, sore throat, earache, hearing loss, dental pain, tinnitus, vertigo, sinus pain, snoring.  CV: Denies chest pain, palpitations, irregular heartbeat, syncope, dyspnea, diaphoresis, orthopnea, PND, claudication or edema. Respiratory: denies cough, dyspnea, DOE, pleurisy, hoarseness, laryngitis, wheezing.  Gastrointestinal: Denies dysphagia, odynophagia, heartburn, reflux, water brash, abdominal pain or cramps, nausea, vomiting, bloating, diarrhea, constipation, hematemesis, melena, hematochezia  or hemorrhoids. Genitourinary: Denies dysuria, frequency, urgency, nocturia, hesitancy, discharge, hematuria or flank pain. Musculoskeletal: Denies arthralgias, myalgias, stiffness, jt. swelling, pain, limping or strain/sprain.  Skin: Denies pruritus, rash, hives, warts, acne, eczema or change in skin lesion(s). Neuro: No weakness, tremor, incoordination, spasms, paresthesia or pain. Psychiatric: Denies confusion, memory loss or sensory loss. Endo: Denies change in weight, skin or hair change.  Heme/Lymph: No excessive bleeding, bruising or enlarged lymph nodes.  Physical Exam  There were no vitals taken for this visit.  Appears  well nourished, well groomed  and in no distress.  Eyes: PERRLA, EOMs, conjunctiva no swelling or erythema. Sinuses: No frontal/maxillary tenderness ENT/Mouth: EAC's clear, TM's nl w/o erythema, bulging. Nares clear w/o erythema, swelling, exudates. Oropharynx clear without erythema or exudates. Oral hygiene is good. Tongue normal, non obstructing. Hearing intact.  Neck: Supple. Thyroid not palpable. Car 2+/2+ without bruits, nodes or JVD. Chest: Respirations nl with BS clear & equal  w/o rales, rhonchi, wheezing or stridor.  Cor: Heart sounds normal w/ regular rate and rhythm without sig. murmurs, gallops, clicks or rubs. Peripheral pulses normal and equal  without edema.  Abdomen: Soft & bowel sounds normal. Non-tender w/o guarding, rebound, hernias, masses or organomegaly.  Lymphatics: Unremarkable.  Musculoskeletal: Full ROM all peripheral extremities, joint stability, 5/5 strength and normal gait.  Skin: Warm, dry without exposed rashes, lesions or ecchymosis apparent.  Neuro: Cranial nerves intact, reflexes equal bilaterally. Sensory-motor testing grossly intact. Tendon reflexes grossly intact.  Pysch: Alert & oriented x 3.  Insight and judgement nl & appropriate. No ideations.  Assessment and Plan:  1. Essential hypertension  - Continue medication, monitor blood pressure at home.  - Continue DASH diet.  Reminder to go to the ER if any CP,  SOB, nausea, dizziness, severe HA, changes vision/speech.   -  CBC with Differential/Platelet - COMPLETE METABOLIC PANEL WITH GFR - Magnesium - TSH  2. Hyperlipidemia, mixed  - Continue diet/meds, exercise,& lifestyle modifications.  - Continue monitor periodic cholesterol/liver & renal functions    - Lipid panel - TSH  3. Abnormal glucose  - Continue diet, exercise  - Lifestyle modifications.  - Monitor appropriate labs.  - Hemoglobin A1c - Insulin, random  4. Vitamin D deficiency  - Continue supplementation.  - VITAMIN D Michael Hydroxy 5. ASHD s/p Stent  - Lipid panel  6. Aortic atherosclerosis ([place]) by Chest CT scan on 10/27/2020  - Lipid panel  7. Pulmonary nodules  - CT CHEST LUNG CA SCREEN LOW DOSE W/O CM; Future  8. Medication management  - CBC with Differential/Platelet - COMPLETE METABOLIC PANEL WITH GFR - Magnesium - Lipid panel - TSH - Hemoglobin A1c - Insulin, random - VITAMIN D Michael Hydroxy        Discussed  regular exercise, BP monitoring, weight control to achieve/maintain BMI  less than Michael and discussed med and SE's. Recommended labs to assess and monitor clinical status with further disposition pending results of labs.  I discussed the assessment and treatment plan with the patient. The patient was provided an opportunity to ask questions and all were answered. The patient agreed with the plan and demonstrated an understanding of the instructions.  I provided over 30 minutes of exam, counseling, chart review and  complex critical decision making.         The patient was advised to call back or seek an in-person evaluation if the symptoms worsen or if the condition fails to improve as anticipated.   Kirtland Bouchard, MD

## 2021-11-18 NOTE — Progress Notes (Signed)
Future Appointments  Date Time Provider Department  11/19/2021                 6 mo OV   4:00 PM Unk Pinto, MD [facility]  02/25/2022 11:30 AM Felipa Eth, Reece Leader., MD [facility]  05/04/2022                CPE 11:00 AM Unk Pinto, MD [facility]  08/11/2022                 Wellness 10:00 AM Darrol Jump, NP [facility]     History of Present Illness:       This very nice 71 y.o. DWM presents for 6 month follow up with HTN, HLD, ASCAD,  COPD, Pre-Diabetes and Vitamin D Deficiency.  Chest CTscan on 01/28/2021 showed Aortic Atherosclerosis. Patient has recent elevated PSA being followed in [place] by Dr Michaelle Birks.                                                           Chest CT scan also showed new pulm nodules (<7.0 mm) in the LUL and Radiologist recommended repeating CT Lung scan in 3 months (due ~ mid Sept).  Patient does endorse chronic exertional dyspnea, but denies any hemoptysis or significant sputum production.       Patient is treated for HTN (2004)  & BP has been controlled at home. Today's BP is at goal - 118/66 .    Patient has had no complaints of any cardiac type chest pain, palpitations, dyspnea / orthopnea / PND, dizziness, claudication, or dependent edema.       Patient has hx /o   Statin Intolerance & Hyperlipidemia is not controlled with diet & meds. Patient denies myalgias or other med SE's. Last Lipids were not at goal:  Lab Results  Component Value Date   CHOL 219 (H) 08/07/2020   HDL 45 08/07/2020   LDLCALC 142 (H) 08/07/2020   TRIG 181 (H) 08/07/2020   CHOLHDL 4.9 08/07/2020     Also, the patient has history of PreDiabetes (A1c 5.7% /2010)and has had no symptoms of reactive hypoglycemia, diabetic polys, paresthesias or visual blurring.  Last A1c was normal & at goal:  Lab Results  Component Value Date   HGBA1C 5.5 05/01/2020                                                         Further, the patient also has history of Vitamin  D Deficiency a "25" /2009)  and supplements vitamin D without any suspected side-effects. Last vitamin D was at goal :   Lab Results  Component Value Date   VD25OH 97 05/01/2020     Current Outpatient Medications on File Prior to Visit  Medication Sig   albuterol  HFA  inhaler Inhale 1-2 puffs every 6 hours as needed    aspirin EC 81 MG tablet Take  daily.   b complex vitamins capsule Take 1 capsule by mouth daily.   bisoprolol-hctz  0-6.25 MG tablet TAKE 1 TABLET EVERY DAY FOR BLOOD PRESSURE   VITAMIN D 1000 UNITS tablet 2,000  Units 3 x /da (6,000 u /day )    Magnesium 250 MG TABS Take every other day.   Multiple Vitamin  Take 1 tablet daily. Takes a vitamin pack daily   saw palmetto 500 MG capsule Take 500 mg daily.   TRELEGY ELLIPTA 100-62.5-25  INHALE 1 PUFF DAILY.    Allergies  Allergen Reactions   Ace Inhibitors Cough   Ciprofloxacin    Fenofibrate dizzy    PMHx:   Past Medical History:  Diagnosis Date   BPH (benign prostatic hyperplasia)    COPD (chronic obstructive pulmonary disease) ([facility])  via CT lung    GERD (gastroesophageal reflux disease)    Hyperlipidemia    Hypertension    Hypogonadism male    Myocardial infarction [facility]) 2005   Per patient no stent or PTCA, just medical treatment   Prediabetes    Vitamin D deficiency      Immunization History  Administered Date(s) Administered   PPD Test 12/25/2013   Pneumococcal -13 02/28/2018   Pneumococcal-23 12/08/2010   Tdap 12/08/2010   Last Colon - 01/12/2019 - Dr Nicki Reaper [facility] - recc 5 year f/u due Aug /Sept 2025.  Past Surgical History:  Procedure Laterality Date   AMPUTATION FINGER / THUMB Left 1991   distal index   CARDIAC CATHETERIZATION N/A 2005   Per patient no stent or PTCA, just medical treatment   CATARACT EXTRACTION, BILATERAL Bilateral 2014   Dr. Talbert Forest    colonoscopy N/A 10/2012   Neg and recc f/u Cololnoscopy in 5 year (due 12/2023) - Dr Unknown Foley - Gastroenterologist    PROSTATE SURGERY     Biopsy X 3   VASECTOMY      FHx:    Reviewed / unchanged  SHx:    Reviewed / unchanged   Systems Review:  Constitutional: Denies fever, chills, wt changes, headaches, insomnia, fatigue, night sweats, change in appetite. Eyes: Denies redness, blurred vision, diplopia, discharge, itchy, watery eyes.  ENT: Denies discharge, congestion, post nasal drip, epistaxis, sore throat, earache, hearing loss, dental pain, tinnitus, vertigo, sinus pain, snoring.  CV: Denies chest pain, palpitations, irregular heartbeat, syncope, dyspnea, diaphoresis, orthopnea, PND, claudication or edema. Respiratory: denies cough, dyspnea, DOE, pleurisy, hoarseness, laryngitis, wheezing.  Gastrointestinal: Denies dysphagia, odynophagia, heartburn, reflux, water brash, abdominal pain or cramps, nausea, vomiting, bloating, diarrhea, constipation, hematemesis, melena, hematochezia  or hemorrhoids. Genitourinary: Denies dysuria, frequency, urgency, nocturia, hesitancy, discharge, hematuria or flank pain. Musculoskeletal: Denies arthralgias, myalgias, stiffness, jt. swelling, pain, limping or strain/sprain.  Skin: Denies pruritus, rash, hives, warts, acne, eczema or change in skin lesion(s). Neuro: No weakness, tremor, incoordination, spasms, paresthesia or pain. Psychiatric: Denies confusion, memory loss or sensory loss. Endo: Denies change in weight, skin or hair change.  Heme/Lymph: No excessive bleeding, bruising or enlarged lymph nodes.  Physical Exam  BP 118/66   Pulse 74   Temp (!) 96.9 F (36.1 C)   Wt 162 lb 6.4 oz (73.7 kg)   SpO2 96%   BMI 22.03 kg/m   Appears  well nourished, well groomed  and in no distress.  Eyes: PERRLA, EOMs, conjunctiva no swelling or erythema. Sinuses: No frontal/maxillary tenderness ENT/Mouth: EAC's clear, TM's nl w/o erythema, bulging. Nares clear w/o erythema, swelling, exudates. Oropharynx clear without erythema or exudates. Oral hygiene is good.  Tongue normal, non obstructing. Hearing intact.  Neck: Supple. Thyroid not palpable. Car 2+/2+ without bruits, nodes or JVD. Chest: Respirations nl with BS clear & equal w/o rales, rhonchi, wheezing or  stridor.  Cor: Heart sounds normal w/ regular rate and rhythm without sig. murmurs, gallops, clicks or rubs. Peripheral pulses normal and equal  without edema.  Abdomen: Soft & bowel sounds normal. Non-tender w/o guarding, rebound, hernias, masses or organomegaly.  Lymphatics: Unremarkable.  Musculoskeletal: Full ROM all peripheral extremities, joint stability, 5/5 strength and normal gait.  Skin: Warm, dry without exposed rashes, lesions or ecchymosis apparent.  Neuro: Cranial nerves intact, reflexes equal bilaterally. Sensory-motor testing grossly intact. Tendon reflexes grossly intact.  Pysch: Alert & oriented x 3.  Insight and judgement nl & appropriate. No ideations.  Assessment and Plan:  1. Essential hypertension  - CBC with Differential/Platelet - COMPLETE METABOLIC PANEL WITH GFR - Magnesium - TSH  - Continue medication, monitor blood pressure at home.  - Continue DASH diet.  Reminder to go to the ER if any CP,  SOB, nausea, dizziness, severe HA, changes vision/speech.      2. Hyperlipidemia, mixed  - Continue diet/meds, exercise,& lifestyle modifications.  - Continue monitor periodic cholesterol/liver & renal functions     - Lipid panel - TSH  3. Abnormal glucose  - Continue diet, exercise  - Lifestyle modifications.  - Monitor appropriate labs.   - Hemoglobin A1c - Insulin, random  4. Vitamin D deficiency  - Continue supplementation.  - VITAMIN D 25 Hydroxy   5. Coronary artery disease involving native coronary  artery of native heart without angina pectoris  - Lipid panel  6. Aortic atherosclerosis ([place]) by Chest CT scan on 10/27/2020  - Lipid panel  7. Medication management  - CBC with Differential/Platelet - COMPLETE METABOLIC PANEL WITH GFR -  Magnesium - Lipid panel - TSH - Hemoglobin A1c - Insulin, random - VITAMIN D 25 Hydroxy         Discussed  regular exercise, BP monitoring, weight control to achieve/maintain BMI less than 25 and discussed med and SE's. Recommended labs to assess and monitor clinical status with further disposition pending results of labs.  I discussed the assessment and treatment plan with the patient. The patient was provided an opportunity to ask questions and all were answered. The patient agreed with the plan and demonstrated an understanding of the instructions.  I provided over 30 minutes of exam, counseling, chart review and  complex critical decision making.         The patient was advised to call back or seek an in-person evaluation if the symptoms worsen or if the condition fails to improve as anticipated.    Kirtland Bouchard, MD

## 2021-11-19 ENCOUNTER — Ambulatory Visit (INDEPENDENT_AMBULATORY_CARE_PROVIDER_SITE_OTHER): Payer: Medicare PPO | Admitting: Internal Medicine

## 2021-11-19 ENCOUNTER — Encounter: Payer: Self-pay | Admitting: Internal Medicine

## 2021-11-19 VITALS — BP 118/66 | HR 74 | Temp 96.9°F | Ht 72.0 in | Wt 162.4 lb

## 2021-11-19 DIAGNOSIS — I2510 Atherosclerotic heart disease of native coronary artery without angina pectoris: Secondary | ICD-10-CM | POA: Diagnosis not present

## 2021-11-19 DIAGNOSIS — Z79899 Other long term (current) drug therapy: Secondary | ICD-10-CM | POA: Diagnosis not present

## 2021-11-19 DIAGNOSIS — E559 Vitamin D deficiency, unspecified: Secondary | ICD-10-CM

## 2021-11-19 DIAGNOSIS — E782 Mixed hyperlipidemia: Secondary | ICD-10-CM

## 2021-11-19 DIAGNOSIS — I700 Atherosclerosis of aorta: Secondary | ICD-10-CM

## 2021-11-19 DIAGNOSIS — R7309 Other abnormal glucose: Secondary | ICD-10-CM

## 2021-11-19 DIAGNOSIS — I10 Essential (primary) hypertension: Secondary | ICD-10-CM

## 2021-11-19 NOTE — Patient Instructions (Addendum)

## 2021-11-20 LAB — COMPLETE METABOLIC PANEL WITH GFR
AG Ratio: 1.7 (calc) (ref 1.0–2.5)
ALT: 14 U/L (ref 9–46)
AST: 17 U/L (ref 10–35)
Albumin: 4 g/dL (ref 3.6–5.1)
Alkaline phosphatase (APISO): 89 U/L (ref 35–144)
BUN: 17 mg/dL (ref 7–25)
CO2: 30 mmol/L (ref 20–32)
Calcium: 9.2 mg/dL (ref 8.6–10.3)
Chloride: 106 mmol/L (ref 98–110)
Creat: 1.08 mg/dL (ref 0.70–1.28)
Globulin: 2.3 g/dL (calc) (ref 1.9–3.7)
Glucose, Bld: 116 mg/dL — ABNORMAL HIGH (ref 65–99)
Potassium: 4.3 mmol/L (ref 3.5–5.3)
Sodium: 142 mmol/L (ref 135–146)
Total Bilirubin: 0.8 mg/dL (ref 0.2–1.2)
Total Protein: 6.3 g/dL (ref 6.1–8.1)
eGFR: 73 mL/min/{1.73_m2} (ref >=60)

## 2021-11-20 LAB — CBC WITH DIFFERENTIAL/PLATELET
Absolute Monocytes: 748 cells/uL (ref 200–950)
Basophils Absolute: 96 cells/uL (ref 0–200)
Basophils Relative: 1.1 %
Eosinophils Absolute: 113 cells/uL (ref 15–500)
Eosinophils Relative: 1.3 %
HCT: 41.9 % (ref 38.5–50.0)
Hemoglobin: 14.2 g/dL (ref 13.2–17.1)
Lymphs Abs: 2393 cells/uL (ref 850–3900)
MCH: 32.1 pg (ref 27.0–33.0)
MCHC: 33.9 g/dL (ref 32.0–36.0)
MCV: 94.6 fL (ref 80.0–100.0)
MPV: 9.9 fL (ref 7.5–12.5)
Monocytes Relative: 8.6 %
Neutro Abs: 5351 cells/uL (ref 1500–7800)
Neutrophils Relative %: 61.5 %
Platelets: 110 10*3/uL — ABNORMAL LOW (ref 140–400)
RBC: 4.43 10*6/uL (ref 4.20–5.80)
RDW: 12.7 % (ref 11.0–15.0)
Total Lymphocyte: 27.5 %
WBC: 8.7 10*3/uL (ref 3.8–10.8)

## 2021-11-20 LAB — INSULIN, RANDOM: Insulin: 37.3 u[IU]/mL — ABNORMAL HIGH

## 2021-11-20 LAB — LIPID PANEL
Cholesterol: 234 mg/dL — ABNORMAL HIGH (ref <200)
HDL: 50 mg/dL (ref >=40)
LDL Cholesterol (Calc): 144 mg/dL (calc) — ABNORMAL HIGH
Non-HDL Cholesterol (Calc): 184 mg/dL (calc) — ABNORMAL HIGH (ref <130)
Total CHOL/HDL Ratio: 4.7 (calc) (ref <5.0)
Triglycerides: 260 mg/dL — ABNORMAL HIGH (ref <150)

## 2021-11-20 LAB — TSH: TSH: 1.35 mIU/L (ref 0.40–4.50)

## 2021-11-20 LAB — VITAMIN D 25 HYDROXY (VIT D DEFICIENCY, FRACTURES): Vit D, 25-Hydroxy: 82 ng/mL (ref 30–100)

## 2021-11-20 LAB — HEMOGLOBIN A1C
Hgb A1c MFr Bld: 5.4 % of total Hgb (ref <5.7)
Mean Plasma Glucose: 108 mg/dL
eAG (mmol/L): 6 mmol/L

## 2021-11-20 LAB — MAGNESIUM: Magnesium: 2.2 mg/dL (ref 1.5–2.5)

## 2021-11-22 ENCOUNTER — Encounter: Payer: Self-pay | Admitting: Internal Medicine

## 2021-11-22 NOTE — Progress Notes (Signed)
<><><><><><><><><><><><><><><><><><><><><><><><><><><><><><><><><> <><><><><><><><><><><><><><><><><><><><><><><><><><><><><><><><><>  - Total Chol = 234 is very high risk for Heart Attack /Stroke /Vascular Dementia     ( Ideal or Goal is less than 180 ! )  & - Bad /Dangerous LDL Chol =  144  - - >> Sitting on a time Bomb !     ( Ideal or Goal is less than 70 ! )   - Treating with meds to lower Cholesterol is treating the result                                          & NOT treating the cause   - The cause is Bad Diet !    - Read or listen to   Dr Wyman Songster 's book    " How Not to Die ! "   - Recommend a stricter plant based low cholesterol diet   - Cholesterol only comes from animal sources                                                              - ie. meat, dairy, egg yolks  - Eat all the vegetables you want.  - Avoid Meat,       Avoid Meat,   Avoid Meat                                                especially red meat - Beef AND Pork   - Avoid cheese & dairy - milk & ice cream.    - Cheese is the most concentrated form of trans-fats which                                                           is the worst thing to clog up our arteries.  - Veggie cheese is OK which can be found in the fresh  produce section at [employer] or [employer] or [employer] <><><><><><><><><><><><><><><><><><><><><><><><><><><><><><><><><> <><><><><><><><><><><><><><><><><><><><><><><><><><><><><><><><><>  -  Also Triglycerides  (  260  ) or fats in blood are too high                      ( Ideal or Goal is less than 150 ! )    - Recommend avoid fried & greasy foods,  sweets / candy,   - Avoid white rice  (brown or wild rice or Quinoa is OK),   - Avoid white potatoes                    (sweet potatoes are OK)   - Avoid anything made from white flour  - bagels, doughnuts, rolls, buns, biscuits, white  and   wheat breads, pizza crust and traditional  pasta made of white flour & egg white  - (vegetarian pasta or spinach or wheat pasta is OK).    - Multi-grain bread is OK - like multi-grain flat bread or  sandwich thins.   - Avoid alcohol in excess.   - Exercise is also important. <><><><><><><><><><><><><><><><><><><><><><><><><><><><><><><><><> <><><><><><><><><><><><><><><><><><><><><><><><><><><><><><><><><>  -  A1c - Normal - No Diabetes  -  Great !  <><><><><><><><><><><><><><><><><><><><><><><><><><><><><><><><><>  -  Vitamin D = 82 - Excellent  Please keep dose same  <><><><><><><><><><><><><><><><><><><><><><><><><><><><><><><><><>  -  All Else - CBC - Kidneys - Electrolytes - Liver - Magnesium & Thyroid    - all  Normal / OK <><><><><><><><><><><><><><><><><><><><><><><><><><><><><><><><><> <><><><><><><><><><><><><><><><><><><><><><><><><><><><><><><><><>

## 2022-02-01 ENCOUNTER — Other Ambulatory Visit: Payer: Self-pay | Admitting: Internal Medicine

## 2022-02-01 DIAGNOSIS — J449 Chronic obstructive pulmonary disease, unspecified: Secondary | ICD-10-CM

## 2022-02-25 ENCOUNTER — Ambulatory Visit: Payer: Medicare PPO | Admitting: Urology

## 2022-02-25 NOTE — Progress Notes (Deleted)
Assessment: 1. Elevated PSA   2. BPH with obstruction/lower urinary tract symptoms     Plan: Today I had a long discussion with the patient regarding PSA and the rationale and controversies of prostate cancer early detection.  I discussed the pros and cons of further evaluation including TRUS and prostate Bx.  Potential adverse events and complications as well as standard instructions were given.  Patient expressed his understanding of these issues.  I also discussed further evaluation with additional laboratory testing including PSA based test such as the 4K score, urine based test such as ExoDx and imaging with prostate MRI. I discussed diagnosis and management of BPH with lower urinary tract symptoms.  Specifically I discussed the role of medical therapy and surgical therapy.  He does not wish to pursue any treatment at this time.  He would like to monitor his symptoms. Free and total PSA  Return to office in 6 months  Chief Complaint:  No chief complaint on file.   History of Present Illness:  Michael [name] is a 71 y.o. year old male who is seen for continued evaluation of elevated PSA.  PSA results: 9/18 7.2 10/19 6.9 10/20 5.6 12/21 7.14 12/22 7.22  He was previously followed by [name] in [place].  He has had 3 prostate biopsies, all negative by patient report.  He reports that his PSA has been as high as 12.  No history of UTIs or prostatitis. He does have lower urinary tract symptoms including frequency, urgency, nocturia 3-4 times, intermittent stream, and decreased force of stream.  No dysuria or gross hematuria. IPSS = 17.  Portions of the above documentation were copied from a prior visit for review purposes only.    Past Medical History:  Past Medical History:  Diagnosis Date   BPH (benign prostatic hyperplasia)    COPD (chronic obstructive pulmonary disease) ([place])    via CT lung   GERD (gastroesophageal reflux disease)    Hyperlipidemia     Hypertension    Hypogonadism male    Myocardial infarction ([place]) 2005   Per patient no stent or PTCA, just medical treatment   Prediabetes    Vitamin D deficiency     Past Surgical History:  Past Surgical History:  Procedure Laterality Date   AMPUTATION FINGER / THUMB Left 1991   distal index   CARDIAC CATHETERIZATION N/A 2005   Per patient no stent or PTCA, just medical treatment   CATARACT EXTRACTION, BILATERAL Bilateral 2014   Dr. Talbert Forest    colonoscopy N/A 10/2012   Neg and recc f/u Cololnoscopy in 5 yeary - Dr Nicki Reaper O/[place] - Gastroenterologist   PROSTATE SURGERY     Biopsy X 3   VASECTOMY      Allergies:  Allergies  Allergen Reactions   Ace Inhibitors Cough   Ciprofloxacin    Fenofibrate     dizzy    Family History:  Family History  Problem Relation Age of Onset   Cancer Father 38       lymphoma   Cancer Maternal Aunt        Lung   Cancer Maternal Uncle        Lung   Cancer Maternal Uncle        Lung   Stroke Maternal Grandmother    Heart failure Maternal Grandfather    Pneumonia Paternal Grandmother    Liver disease Paternal Grandfather    Stroke Son 86   Other Son  internal intestinal hemorage    Social History:  Social History   Tobacco Use   Smoking status: Every Day    Packs/day: 1.00    Years: 45.00    Total pack years: 45.00    Types: Cigarettes    Start date: 1965   Smokeless tobacco: Never  Substance Use Topics   Alcohol use: Yes    Alcohol/week: 5.0 standard drinks of alcohol    Types: 5 Cans of beer per week   Drug use: No    ROS: Constitutional:  Negative for fever, chills, weight loss CV: Negative for chest pain, previous MI, hypertension Respiratory:  Negative for shortness of breath, wheezing, sleep apnea, frequent cough GI:  Negative for nausea, vomiting, bloody stool, GERD  Physical exam: There were no vitals taken for this visit. GENERAL APPEARANCE:  Well appearing, well developed, well nourished, NAD HEENT:   Atraumatic, normocephalic, oropharynx clear NECK:  Supple without lymphadenopathy or thyromegaly ABDOMEN:  Soft, non-tender, no masses EXTREMITIES:  Moves all extremities well, without clubbing, cyanosis, or edema NEUROLOGIC:  Alert and oriented x 3, normal gait, CN II-XII grossly intact MENTAL STATUS:  appropriate BACK:  Non-tender to palpation, No CVAT SKIN:  Warm, dry, and intact   Results: U/A:

## 2022-02-26 ENCOUNTER — Telehealth: Payer: Self-pay | Admitting: Internal Medicine

## 2022-02-26 NOTE — Chronic Care Management (AMB) (Signed)
  Chronic Care Management   Note  02/26/2022 Name: Michael [name] MRN: [id number] DOB: Oct 07, 1950  Michael [name] is a 71 y.o. year old male who is a primary care patient of Unk Pinto, MD. I reached out to Michael [name] by phone today in response to a referral sent by [name] PCP, Unk Pinto, MD.   [name] was given information about Chronic Care Management services today including:  CCM service includes personalized support from designated clinical staff supervised by his physician, including individualized plan of care and coordination with other care providers 24/7 contact phone numbers for assistance for urgent and routine care needs. Service will only be billed when office clinical staff spend 20 minutes or more in a month to coordinate care. Only one practitioner may furnish and bill the service in a calendar month. The patient may stop CCM services at any time (effective at the end of the month) by phone call to the office staff.   Patient agreed to services and verbal consent obtained.   Follow up [place]

## 2022-03-05 ENCOUNTER — Ambulatory Visit (INDEPENDENT_AMBULATORY_CARE_PROVIDER_SITE_OTHER): Payer: Medicare PPO | Admitting: Urology

## 2022-03-05 ENCOUNTER — Encounter: Payer: Self-pay | Admitting: Urology

## 2022-03-05 VITALS — BP 132/76 | HR 66

## 2022-03-05 DIAGNOSIS — N401 Enlarged prostate with lower urinary tract symptoms: Secondary | ICD-10-CM | POA: Diagnosis not present

## 2022-03-05 DIAGNOSIS — N138 Other obstructive and reflux uropathy: Secondary | ICD-10-CM | POA: Diagnosis not present

## 2022-03-05 DIAGNOSIS — R972 Elevated prostate specific antigen [PSA]: Secondary | ICD-10-CM | POA: Diagnosis not present

## 2022-03-05 NOTE — Progress Notes (Signed)
Assessment: 1. Elevated PSA   2. BPH with obstruction/lower urinary tract symptoms     Plan: I again discussed diagnosis and management of BPH with lower urinary tract symptoms. He does not wish to pursue any treatment at this time.  He would like to continue to monitor his symptoms. Free and total PSA today Return to office in 6 months  Chief Complaint:  Chief Complaint  Patient presents with   Elevated PSA    History of Present Illness:  Michael [name] is a 71 y.o. year old male who is seen for continued evaluation of elevated PSA.  PSA results: 9/18 7.2 10/19 6.9 10/20 5.6 12/21 7.14 12/22 7.22  He was previously followed by [name] in [place].  He has had 3 prostate biopsies, all negative by patient report.  He reports that his PSA has been as high as 12.  No history of UTIs or prostatitis. He does have lower urinary tract symptoms including frequency, urgency, nocturia 3-4 times, intermittent stream, and decreased force of stream.  No dysuria or gross hematuria. IPSS = 17.  He presents today for scheduled follow-up.  His urinary symptoms are stable.  He continues with urinary frequency, urgency, slow stream, intermittent stream, and nocturia x3.  No dysuria or gross hematuria. IPSS = 21 today.  Portions of the above documentation were copied from a prior visit for review purposes only.    Past Medical History:  Past Medical History:  Diagnosis Date   BPH (benign prostatic hyperplasia)    COPD (chronic obstructive pulmonary disease) ([place])    via CT lung   GERD (gastroesophageal reflux disease)    Hyperlipidemia    Hypertension    Hypogonadism male    Myocardial infarction ([place]) 2005   Per patient no stent or PTCA, just medical treatment   Prediabetes    Vitamin D deficiency     Past Surgical History:  Past Surgical History:  Procedure Laterality Date   AMPUTATION FINGER / THUMB Left 1991   distal index   CARDIAC CATHETERIZATION N/A 2005    Per patient no stent or PTCA, just medical treatment   CATARACT EXTRACTION, BILATERAL Bilateral 2014   Dr. Talbert Forest    colonoscopy N/A 10/2012   Neg and recc f/u Cololnoscopy in 5 yeary - Dr Nicki Reaper [place] - Gastroenterologist   PROSTATE SURGERY     Biopsy X 3   VASECTOMY      Allergies:  Allergies  Allergen Reactions   Ace Inhibitors Cough   Ciprofloxacin    Fenofibrate     dizzy    Family History:  Family History  Problem Relation Age of Onset   Cancer Father 60       lymphoma   Cancer Maternal Aunt        Lung   Cancer Maternal Uncle        Lung   Cancer Maternal Uncle        Lung   Stroke Maternal Grandmother    Heart failure Maternal Grandfather    Pneumonia Paternal Grandmother    Liver disease Paternal Grandfather    Stroke Son 55   Other Son        internal intestinal hemorage    Social History:  Social History   Tobacco Use   Smoking status: Every Day    Packs/day: 1.00    Years: 45.00    Total pack years: 45.00    Types: Cigarettes    Start date: 1965   Smokeless  tobacco: Never  Substance Use Topics   Alcohol use: Yes    Alcohol/week: 5.0 standard drinks of alcohol    Types: 5 Cans of beer per week   Drug use: No    ROS: Constitutional:  Negative for fever, chills, weight loss CV: Negative for chest pain, previous MI, hypertension Respiratory:  Negative for shortness of breath, wheezing, sleep apnea, frequent cough GI:  Negative for nausea, vomiting, bloody stool, GERD  Physical exam: BP 132/76   Pulse 66  GENERAL APPEARANCE:  Well appearing, well developed, well nourished, NAD HEENT:  Atraumatic, normocephalic, oropharynx clear NECK:  Supple without lymphadenopathy or thyromegaly ABDOMEN:  Soft, non-tender, no masses EXTREMITIES:  Moves all extremities well, without clubbing, cyanosis, or edema NEUROLOGIC:  Alert and oriented x 3, normal gait, CN II-XII grossly intact MENTAL STATUS:  appropriate BACK:  Non-tender to palpation, No  CVAT SKIN:  Warm, dry, and intact GU: Prostate: 50 gm, NT, no nodules Rectum: Normal tone,  no masses or tenderness   Results: No specimen provided

## 2022-03-06 LAB — PSA, TOTAL AND FREE
PSA, Free Pct: 20.8 %
PSA, Free: 1.37 ng/mL
Prostate Specific Ag, Serum: 6.6 ng/mL — ABNORMAL HIGH (ref 0.0–4.0)

## 2022-03-08 ENCOUNTER — Telehealth: Payer: Self-pay

## 2022-03-08 NOTE — Telephone Encounter (Signed)
-----   Message from Primus Bravo, MD sent at 03/08/2022  8:53 AM EDT ----- Please notify Michael [name] that his PSA has decreased from prior levels. Recommend continued monitoring at this time.

## 2022-03-08 NOTE — Telephone Encounter (Signed)
Patient informed of MD response to PSA results and voiced understanding.

## 2022-03-31 ENCOUNTER — Telehealth: Payer: Self-pay

## 2022-03-31 NOTE — Telephone Encounter (Signed)
LM-03/31/22-Chart Prep started, Reviewing OV, Consults, Hospital visits, Labs and medication changes.    Total time spent: 33 min.

## 2022-04-01 NOTE — Telephone Encounter (Signed)
LM-04/01/22-Care plan created for upcoming CP initial visit.   Total time spent: 10 min.

## 2022-04-01 NOTE — Telephone Encounter (Signed)
LM-04/01/22-Called pt. And confirmed CP visit initial office visit for 11/14 at 3:00PM. Pt. Has no copay barriers, and no health concern he'd like to discuss at his visit.  Total time spent: 5 min

## 2022-04-05 NOTE — Telephone Encounter (Signed)
LM-04/05/22-Called pt. To see if he could come into the office for CP visit tomorrow at 1pm instead of 3 d/t CP having a meeting. Patient verbalized understanding and agreed to come in the office tomorrow at 1:00PM for his initial CP visit. CP made aware.  Total time spent: 4 min.

## 2022-04-06 ENCOUNTER — Ambulatory Visit: Payer: Medicare PPO | Admitting: Pharmacy Technician

## 2022-04-06 DIAGNOSIS — F17200 Nicotine dependence, unspecified, uncomplicated: Secondary | ICD-10-CM

## 2022-04-06 DIAGNOSIS — E782 Mixed hyperlipidemia: Secondary | ICD-10-CM

## 2022-04-06 DIAGNOSIS — I10 Essential (primary) hypertension: Secondary | ICD-10-CM

## 2022-04-07 NOTE — Telephone Encounter (Signed)
LM-04/07/22-Reviewed, edited, and uploaded initial CP visit notes to patients documents. FWD to CP for review.  Total time spent: 4 min.

## 2022-04-08 NOTE — Progress Notes (Signed)
Pharmacist Visit   [place]  76 years, Male  DOB: 1950-11-04  M:   __________________________________________________ Chronic Conditions Patient's Chronic Conditions: Chronic Obstructive Pulmonary Disease (COPD), Hypertension (HTN), Hyperlipidemia/Dyslipidemia (HLD), Benign Prostatic Hyperplasia (BPH), Vitamins/Supplements, Tobacco Use Disorder, Diabetes (DM), Cardiovascular Disease (CVD)  Summary for PCP:  1. Patient does not have documented low dose CT scan for 2023. 2. PCP consult: Due to ASCVD and LDL levels patient needs to be started on Moderate to high intensity statin or Repatha. Also if intolerant to statins, ICD code for Myopathy needs to be added and captured. Declined treatment today. 3. Patient needs PFTs for accurate diagnosis of COPD. 4. Smoking cessation counseling done today but patient not interested in quitting. 5. Patient needs Flu, Pneumonia, and shingles vaccine. 6. PCP consider changing Ziac to 1 tab daily instead of 1/2 for Medicare adherence gap measures.  Disease Assessments Visit Completed on: 04/06/2022 Did patient bring medications to appointment?: No What source (s) was used to reconcile medications this visit?: Verbal recall from patient/caregiver, EMR list Subjective: Very sweet gentleman presenting in office for CCM initial visit. He did not bring in his medications today. He is divorced. Has 3 children, 1 that is local. He still has a good relationship with his ex-wife. 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 He enjoys handy man work on days off and is remodeling a house currently. He does not have any complaints today. Lifestyle habits: Diet: No salt, eats out alot Exercise: None Tobacco: Current smoker. 1 PPD Alcohol: None Caffeine: 3-5 cups of coffee daily Recreational drugs: None What is the patient's sleep pattern?: No sleep issues How many hours per night does patient typically sleep?: 8+  SDOH: Accountable Health Communities  Health-Related Social Needs Screening Tool ([email or web address]) SDOH questions were documented and reviewed (EMR or [employer]) within the past 12 months or since hospitalization?: No What is your living situation today? (ref #1): I have a steady place to live Think about the place you live. Do you have problems with any of the following? (ref #2): None of the above Within the past 12 months, you worried that your food would run out before you got money to buy more (ref #3): Never true Within the past 12 months, the food you bought just didn't last and you didn't have money to get more (ref #4): Never true In the past 12 months, has lack of reliable transportation kept you from medical appointments, meetings, work or from getting things needed for daily living? (ref #5): No In the past 12 months, has the electric, gas, oil, or water company threatened to shut off services in your home? (ref #6): No How often does anyone, including family and friends, physically hurt you? (ref #7): Never (1) How often does anyone, including family and friends, insult or talk down to you? (ref #8): Never (1) How often does anyone, including friends and family, threaten you with harm? (ref #9): Never (1) How often does anyone, including family and friends, scream or curse at you? (ref #10): Never (1)  Medication Adherence Does the [facility] have access to medication refill history?: Yes Medication adherence rates for STAR metric medications:  Bisoprolol-HCTZ '10mg'$ -07/03/21 (90DS), 09/18/21 (90DS)  Medication adherence rates for non-STAR metric medications:  Bisoprolol-HCTZ '10mg'$ -07/03/21 (90DS), 09/18/21 (90DS)  Factors that may affect medication adherence?: Perceived lack of benefit of therapy, Lack of understanding / insight of condition Name and location of Current pharmacy: [facility] [address] Current Rx insurance plan: [employer] Are meds  synced by current pharmacy?:  No Are meds delivered by current pharmacy?: Yes - by mail order pharmacy Would patient benefit from direct intervention of clinical lead in dispensing process to optimize clinical outcomes?: No Are [facility] pharmacy services available where patient lives?: Yes Is patient disadvantaged to use [facility]?: Yes Does patient experience delays in picking up medications due to transportation concerns (getting to pharmacy)?: No Medication organization: [employer] [facility] Assessment:: Adherent Plan/Follow up: Only takes 1/2 tab of Ziac daily per provider.  Hypertension (HTN) Most Recent BP: 132/76 Most Recent HR: 66 taken on: 03/05/2022 Care Gap: Need BP documented or last BP 140/90 or higher: Addressed Assessed today?: Yes Goal: <130/80 mmHG Is Patient checking BP at home?: Yes Patient home BP readings are ranging: 120s/70s Has patient experienced hypotension, dizziness, falls or bradycardia?: No We discussed: DASH diet:  following a diet emphasizing fruits and vegetables and low-fat dairy products along with whole grains, fish, poultry, and nuts. Reducing red meats and sugars., Proper Home BP Measurement, Increasing exercise (walking, biking, swimming) to a goal of 30 minutes per day, as able based on current activity level and health or as directed by your healthcare provider., Contacting PCP office for signs and symptoms of high or low blood pressure (hypotension, dizziness, falls, headaches, edema) Assessment:: Controlled Drug: Bisoprolol-HCTZ 10-6.'25mg'$ -1/2 tab QD Pharmacist Assessment: Appropriate, Query Effectiveness Plan/Follow up: PCP consult to increase Ziac to 1 tab daily to close adherence gap.  Hyperlipidemia/Dyslipidemia (HLD) Last Lipid panel on: 11/19/2021 TC (Goal<200): 234 LDL: 144 HDL (Goal>40): 50 TG (Goal<150): 260 ASCVD 10-year risk?is:: High (>20%) ASCVD Risk Score: 30.3% Assessed today?: Yes Additional Risk Factors: Previous heart attack LDL Goal: <70 Additional  Info: Refuses treatment We discussed: Complications of hyperlipidemia and prevention methods with patient for 5-10 minutes, Increasing exercise (walking, biking, swimming) to a goal of 30 minutes per day, as able based on current activity level and health or as directed by your healthcare provider, How a diet high in fruits/vegetables/nuts/whole grains/beans may help to reduce your cholesterol. Increasing soluble fiber intake.  Avoiding sugary foods and trans fat, limiting carbohydrates, and reducing portion sizes. Recommended increasing intake of healthy fats into their diet, Weight reduction- We discussed losing 5-10% of body weight Assessment:: Uncontrolled Plan/Follow up: PCP consult to start statin, Zetia, or Repatha at [place] in December. Diet and lifestyle modifications discussed.  Chronic Obstructive Pulmonary Disease (COPD) Most recent FEV1/FVC: Unknown Most recent FEV1: Unknown Most recent Eosinophils: 113 taken on: 11/19/2021 Assessed today?: No Drug: Albuterol 117mg-inhale 1-2 puffs Q6hr PRN Pharmacist Assessment: Appropriate, Effective, Safe, Accessible Drug: Trelegy-1 inhalation QD Pharmacist Assessment: Appropriate, Effective, Safe, Accessible Plan/Follow up: Patient needs documented PFTs for accurate diagnosis of COPD.  Vitamins / Supplements We discussed: Lack of evidence for use of multivitamins, Confirming with pharmacist the safety of new vitamins / supplements with current medications before starting / adding to medication regimen Drug: B complex vitamin-1 cap QD Pharmacist Assessment: Appropriate, Query Effectiveness Drug: Magnesium '250mg'$ -1 tab QD Pharmacist Assessment: Appropriate, Query Effectiveness Drug: Multivitamin-1 tab QD Pharmacist Assessment: Appropriate, Query Effectiveness Drug: Mucinex Pharmacist Assessment: Query Appropriateness Drug: Saw Palmetto '500mg'$ -Take '900mg'$  QD Pharmacist Assessment: Query Appropriateness  Tobacco Use Disorder Assessed today?:  Yes Is patient currently Smoking or Vaping?: Yes How long has patient been smoking/vaping?: 40+ years How much does patient smoke/vape per day?: 1 PPD How soon after you wake up do you smoke your first cigarette?: Within 6-30 minutes (2 points) Do you find it difficult to refrain from smoking in places  where it is forbidden (church, Art therapist, Archivist, restaurants)?: No (0 points) Which cigarette would you hate most to give up?: When craving (0 points) How many cigarettes per day do you smoke?: 21 to 30 (2 points) Do you smoke more during the morning hours than during the rest of the day?: No (0 points) Faegerstrom score:: 4 How motivated are you to quit smoking in the next 30 days (scale 1-10)?: 1 How confident are you that you can quit smoking (scale 1-10)?: 3 Is patient ready to quit smoking?: No What do you like about smoking?: Habit and stress release Additional Info: Has quit one time in the past for 6 months after heat attack We discussed: Annual Low-dose chest CT, Spent 10-15 minutes educating on risk of tobacco and nicotine use and discussing cessation methods Assessment:: Uncontrolled Drug: None Plan/Follow up: Declined smoking cessation therapy.  Preventative Health Care Gap: Colorectal cancer screening: Addressed Care Gap: Annual Wellness Visit (AWV): Addressed Immunizations needed: Influenza, Pneumococcal, Zoster Additional exercise counseling points. We discussed: aiming to lose 5 to 10% of body weight through lifestyle modifications Additional diet counseling points. We discussed: key components of the DASH diet, key components of a low-carb eating plan, aiming to consume at least 8 cups of water day, limiting caffeine intake  Clinical Summary Next CCM Follow Up: 6 months Next AWV: 08/11/22 at 10:00AM Next PCP Visit: 05/04/22 at 11:00AM  Attestation Statement:: CCM Services:  This encounter meets complex CCM services and moderate to high medical decision making.  Prior to  outreach and patient consent for Chronic Care Management, I referred this patient for services after reviewing the nominated patient list or from a personal encounter with the patient.  I have personally reviewed this encounter including the documentation in this note and have collaborated with the care management provider regarding care management and care coordination activities to include development and update of the comprehensive care plan I am certifying that I agree with the content of this note and encounter as supervising physician.  CPP Prep: 25mn CPP OV: 467m CPP Doc: 2m82mCPP Care Plan: 7mi86mPharmacy Interventions Pharmacist Interventions discussed: Yes Discontinued Therapy: Inappropriate OTC use Started Therapy: Untreated medical condition, Preventative therapy, Immunizations recommended Monitoring: Preventative health screenings, Routine monitoring Education: Lifestyle modifications, Smoking cessation  AverMarda StalkerarmD Clinical Pharmacist AverNaida Sleightton'@[email or web address]'$ .care [phone]

## 2022-04-22 DIAGNOSIS — I10 Essential (primary) hypertension: Secondary | ICD-10-CM | POA: Diagnosis not present

## 2022-04-22 DIAGNOSIS — E782 Mixed hyperlipidemia: Secondary | ICD-10-CM | POA: Diagnosis not present

## 2022-05-03 ENCOUNTER — Ambulatory Visit (INDEPENDENT_AMBULATORY_CARE_PROVIDER_SITE_OTHER): Payer: Medicare PPO | Admitting: Nurse Practitioner

## 2022-05-03 ENCOUNTER — Encounter: Payer: Self-pay | Admitting: Nurse Practitioner

## 2022-05-03 ENCOUNTER — Other Ambulatory Visit: Payer: Self-pay

## 2022-05-03 VITALS — BP 158/82 | HR 98 | Temp 97.5°F | Wt 167.0 lb

## 2022-05-03 DIAGNOSIS — F17200 Nicotine dependence, unspecified, uncomplicated: Secondary | ICD-10-CM

## 2022-05-03 DIAGNOSIS — R062 Wheezing: Secondary | ICD-10-CM

## 2022-05-03 DIAGNOSIS — J029 Acute pharyngitis, unspecified: Secondary | ICD-10-CM | POA: Diagnosis not present

## 2022-05-03 DIAGNOSIS — R6889 Other general symptoms and signs: Secondary | ICD-10-CM | POA: Diagnosis not present

## 2022-05-03 DIAGNOSIS — R042 Hemoptysis: Secondary | ICD-10-CM | POA: Diagnosis not present

## 2022-05-03 DIAGNOSIS — J00 Acute nasopharyngitis [common cold]: Secondary | ICD-10-CM

## 2022-05-03 DIAGNOSIS — J449 Chronic obstructive pulmonary disease, unspecified: Secondary | ICD-10-CM | POA: Diagnosis not present

## 2022-05-03 DIAGNOSIS — M549 Dorsalgia, unspecified: Secondary | ICD-10-CM | POA: Diagnosis not present

## 2022-05-03 DIAGNOSIS — R911 Solitary pulmonary nodule: Secondary | ICD-10-CM

## 2022-05-03 DIAGNOSIS — Z1152 Encounter for screening for COVID-19: Secondary | ICD-10-CM

## 2022-05-03 LAB — POCT INFLUENZA A/B
Influenza A, POC: NEGATIVE
Influenza B, POC: NEGATIVE

## 2022-05-03 LAB — POC COVID19 BINAXNOW: SARS Coronavirus 2 Ag: NEGATIVE

## 2022-05-03 MED ORDER — ALBUTEROL SULFATE HFA 108 (90 BASE) MCG/ACT IN AERS: 1.0000 | INHALATION_SPRAY | Freq: Four times a day (QID) | RESPIRATORY_TRACT | 1 refills | Status: AC | PRN

## 2022-05-03 MED ORDER — PREDNISONE 20 MG PO TABS: ORAL_TABLET | ORAL | 0 refills | Status: DC

## 2022-05-03 MED ORDER — AZITHROMYCIN 500 MG PO TABS: 500.0000 mg | ORAL_TABLET | Freq: Every day | ORAL | 0 refills | Status: AC

## 2022-05-03 MED ORDER — IPRATROPIUM-ALBUTEROL 0.5-2.5 (3) MG/3ML IN SOLN: 3.0000 mL | Freq: Once | RESPIRATORY_TRACT | Status: DC

## 2022-05-03 NOTE — Addendum Note (Signed)
Addended by: Chancy Hurter on: 05/03/2022 10:40 AM   Modules accepted: Orders

## 2022-05-03 NOTE — Progress Notes (Signed)
Assessment and Plan:  Michael [name] was seen today for an episodic visit.  Acute nasopharyngitis Stay well hydrated to keep mucus thin and productive Continue Mucinex PRN  - azithromycin (ZITHROMAX) 500 MG tablet; Take 1 tablet (500 mg total) by mouth daily for 10 days.  Dispense: 10 tablet; Refill: 0  Chronic obstructive pulmonary disease, unspecified COPD type (HCC)/Smoker Smoking cessation instruction/counseling given:  counseled patient on the dangers of tobacco use, advised patient to stop smoking, and reviewed strategies to maximize success  - ipratropium-albuterol (DUONEB) 0.5-2.5 (3) MG/3ML nebulizer solution 3 mL - predniSONE (DELTASONE) 20 MG tablet; Take 2 tabs (40 mg) for 3 days followed by 1 tab (20 mg) for 4 days.  Dispense: 10 tablet; Refill: 0 - CT CHEST LUNG CA SCREEN LOW DOSE W/O CM; Future  Hemoptysis  - CT CHEST LUNG CA SCREEN LOW DOSE W/O CM; Future  Wheezing Continue Trelegy and Albuterol as directed. Nebulizer administered - patient tolerated well. Wheezing diminished.  - ipratropium-albuterol (DUONEB) 0.5-2.5 (3) MG/3ML nebulizer solution 3 mL - predniSONE (DELTASONE) 20 MG tablet; Take 2 tabs (40 mg) for 3 days followed by 1 tab (20 mg) for 4 days.  Dispense: 10 tablet; Refill: 0  Sore throat Warm salt water gargles several times throughout the day. Chloraseptic spray, throat lozenges as needed.  Solid nodule of lung 6 mm to 8 mm in diameter/Upper back pain Discussed possible PET scan if nodule size >8 mm.  - CT CHEST LUNG CA SCREEN LOW DOSE W/O CM; Future   Report to ER or call 911 for any increase in difficulty breathing.   Notify office for further evaluation and treatment, questions or concerns if s/s fail to improve. The risks and benefits of my recommendations, as well as other treatment options were discussed with the patient today. Questions were answered.  Further disposition pending results of labs. Discussed med's effects and SE's.     Over 20 minutes of exam, counseling, chart review, and critical decision making was performed.   Future Appointments  Date Time Provider [place]  05/04/2022 11:00 AM Unk Pinto, MD [facility] None  08/11/2022 10:00 AM Darrol Jump, NP [facility] None  09/10/2022 10:50 AM McKenzie, Candee Furbish, MD [facility] None    ------------------------------------------------------------------------------------------------------------------   HPI BP (!) 158/82   Pulse 98   Temp (!) 97.5 F (36.4 C)   Wt 167 lb (75.8 kg)   SpO2 96%   BMI 22.65 kg/m    Patient complains of symptoms of a URI, possible sinusitis. Symptoms include achiness, congestion, cough described as productive, nasal congestion, sore throat, wheezing, and hemoptysis . He is a current every day smoker.  Review of last CT Lung Screen revealed a 7 mm right posterior upper lung nodule.  Re-screening was to be completed 3 months later, which has not been completed.  Onset of symptoms was 1 week ago, and has been unchanged since that time. Treatment to date:  Mucinex .  Denies fever, chills, N/V, increase in SOB or DOE.     Past Medical History:  Diagnosis Date   BPH (benign prostatic hyperplasia)    COPD (chronic obstructive pulmonary disease) (HCC)    via CT lung   GERD (gastroesophageal reflux disease)    Hyperlipidemia    Hypertension    Hypogonadism male    Myocardial infarction [facility]) 2005   Per patient no stent or PTCA, just medical treatment   Prediabetes    Vitamin D deficiency      Allergies  Allergen Reactions   Ace Inhibitors Cough   Ciprofloxacin    Fenofibrate     dizzy    Current Outpatient Medications on File Prior to Visit  Medication Sig   acetaminophen (TYLENOL) 500 MG tablet Take 500 mg by mouth every 6 (six) hours as needed.   ascorbic acid (VITAMIN C) 500 MG tablet Take 500 mg by mouth 4 (four) times daily.   aspirin EC 81 MG tablet Take 81 mg by mouth daily.   b complex  vitamins capsule Take 1 capsule by mouth daily.   bisoprolol-hydrochlorothiazide (ZIAC) 10-6.25 MG tablet TAKE 1 TABLET EVERY DAY FOR BLOOD PRESSURE (Patient taking differently: 0.5 tablets. TAKE 1 TABLET EVERY DAY FOR BLOOD PRESSURE)   cholecalciferol (VITAMIN D) 1000 UNITS tablet 2,000 Units in the morning, at noon, and at bedtime. 4000 total a day   Fluticasone-Umeclidin-Vilant (TRELEGY ELLIPTA) 100-62.5-25 MCG/ACT AEPB Use  1 Inhalation  Daily  for COPD / Asthma   Magnesium 250 MG TABS Take by mouth every other day.   Multiple Vitamin (MULTIVITAMIN) tablet Take 1 tablet by mouth daily. Takes a vitamin pack daily   Omega-3 Fatty Acids (OMEGA 3 PO) Take by mouth. Takes Omega Red   Pseudoephedrine-guaiFENesin (Green Level D PO) Take by mouth.   saw palmetto 500 MG capsule Take 900 mg by mouth daily.   albuterol (VENTOLIN HFA) 108 (90 Base) MCG/ACT inhaler Inhale 1-2 puffs into the lungs every 6 (six) hours as needed for wheezing or shortness of breath. (Patient not taking: Reported on 05/03/2022)   No current facility-administered medications on file prior to visit.    ROS: all negative except what is noted in the HPI.   Physical Exam:  BP (!) 158/82   Pulse 98   Temp (!) 97.5 F (36.4 C)   Wt 167 lb (75.8 kg)   SpO2 96%   BMI 22.65 kg/m   General Appearance: NAD.  Awake, conversant and cooperative. Eyes: PERRLA, EOMs intact.  Sclera white.  Conjunctiva without erythema. Sinuses: No frontal/maxillary tenderness.  No nasal discharge. Nares patent.  ENT/Mouth: Ext aud canals clear.  Bilateral TMs w/DOL and without erythema or bulging. Hearing intact.  Posterior pharynx without swelling or exudate.  Tonsils without swelling or erythema.  Neck: Supple.  No masses, nodules or thyromegaly. Respiratory: Effort is regular with non-labored breathing. Breath sounds are equal bilaterally with scattered wheezing and rhonchi upon expiration and inspiration. Cardio: RRR with no MRGs. Brisk peripheral  pulses without edema.  Abdomen: Active BS in all four quadrants.  Soft and non-tender without guarding, rebound tenderness, hernias or masses. Lymphatics: Non tender without lymphadenopathy.  Musculoskeletal: Full ROM, 5/5 strength, normal ambulation.  No clubbing or cyanosis. Skin: Appropriate color for ethnicity. Warm without rashes, lesions, ecchymosis, ulcers.  Neuro: CN II-XII grossly normal. Normal muscle tone without cerebellar symptoms and intact sensation.   Psych: AO X 3,  appropriate mood and affect, insight and judgment.     Darrol Jump, NP 9:46 AM [facility]

## 2022-05-03 NOTE — Patient Instructions (Signed)
COPD and Physical Activity ?Chronic obstructive pulmonary disease (COPD) is a long-term, or chronic, condition that affects the lungs. COPD is a general term that can be used to describe many problems that cause inflammation of the lungs and limit airflow. These conditions include chronic bronchitis and emphysema. ?The main symptom of COPD is shortness of breath, which makes it harder to do even simple tasks. This can also make it harder to exercise and stay active. Talk with your health care provider about treatments to help you breathe better and actions you can take to prevent breathing problems during physical activity. ?What are the benefits of exercising when you have COPD? ?Exercising regularly is an important part of a healthy lifestyle. You can still exercise and do physical activities even though you have COPD. Exercise and physical activity improve your shortness of breath by increasing blood flow (circulation). This causes your heart to pump more oxygen through your body. Moderate exercise can: ?Improve oxygen use. ?Increase your energy level. ?Help with shortness of breath. ?Strengthen your breathing muscles. ?Improve heart health. ?Help with sleep. ?Improve your self-esteem and feelings of self-worth. ?Lower depression, stress, and anxiety. ?Exercise can benefit everyone with COPD. The severity of your disease may affect how hard you can exercise, especially at first, but everyone can benefit. Talk with your health care provider about how much exercise is safe for you, and which activities and exercises are safe for you. ?What actions can I take to prevent breathing problems during physical activity? ?Sign up for a pulmonary rehabilitation program. This type of program may include: ?Education about lung diseases. ?Exercise classes that teach you how to exercise and be more active while improving your breathing. This usually involves: ?Exercise using your lower extremities, such as a stationary  bicycle. ?About 30 minutes of exercise, 2 to 5 times per week, for 6 to 12 weeks. ?Strength training, such as push-ups or leg lifts. ?Nutrition education. ?Group classes in which you can talk with others who also have COPD and learn ways to manage stress. ?If you use an oxygen tank, you should use it while you exercise. Work with your health care provider to adjust your oxygen for your physical activity. Your resting flow rate is different from your flow rate during physical activity. ?How to manage your breathing while exercising ?While you are exercising: ?Take slow breaths. ?Pace yourself, and do nottry to go too fast. ?Purse your lips while breathing out. Pursing your lips is similar to a kissing or whistling position. ?If doing exercise that uses a quick burst of effort, such as weight lifting: ?Breathe in before starting the exercise. ?Breathe out during the hardest part of the exercise, such as raising the weights. ?Where to find support ?You can find support for exercising with COPD from: ?Your health care provider. ?A pulmonary rehabilitation program. ?Your local health department or community health programs. ?Support groups, either online or in-person. Your health care provider may be able to recommend support groups. ?Where to find more information ?You can find more information about exercising with COPD from: ?American Lung Association: lung.org ?COPD Foundation: copdfoundation.org ?Contact a health care provider if: ?Your symptoms get worse. ?You have nausea. ?You have a fever. ?You want to start a new exercise program or a new activity. ?Get help right away if: ?You have chest pain. ?You cannot breathe. ?These symptoms may represent a serious problem that is an emergency. Do not wait to see if the symptoms will go away. Get medical help right away. Call   your local emergency services (911 in the [place] Do not drive yourself to the hospital. ?Summary ?COPD is a general term that can be used to describe  many different lung problems that cause lung inflammation and limit airflow. This includes chronic bronchitis and emphysema. ?Exercise and physical activity improve your shortness of breath by increasing blood flow (circulation). This causes your heart to provide more oxygen to your body. ?Contact your health care provider before starting any exercise program or new activity. Ask your health care provider what exercises and activities are safe for you. ?This information is not intended to replace advice given to you by your health care provider. Make sure you discuss any questions you have with your health care provider. ?Document Revised: 03/18/2020 Document Reviewed: 03/18/2020 ?Elsevier Patient Education ? 2023 [employer] Inc[employer] ? ?

## 2022-05-04 ENCOUNTER — Encounter: Payer: Medicare PPO | Admitting: Internal Medicine

## 2022-05-17 ENCOUNTER — Encounter: Payer: Self-pay | Admitting: Internal Medicine

## 2022-05-17 NOTE — Progress Notes (Unsigned)
Annual  Screening/Preventative Visit  & Comprehensive Evaluation & Examination  Future Appointments  Date Time Provider Department  05/18/2022               cpe 10:00 AM Unk Pinto, MD [facility]  08/11/2022                 wellness 10:00 AM Darrol Jump, NP [facility]  09/10/2022 10:50 AM McKenzie, Candee Furbish, MD [facility]  05/20/2023               cpe 10:00 AM Unk Pinto, MD [facility]              This very nice 71 y.o. DWM  presents for a Screening /Preventative Visit & comprehensive evaluation and management of multiple medical co-morbidities.  Patient has been followed for HTN, HLD, Prediabetes and Vitamin D Deficiency.  Patient is being followed in [place] by Dr Michaelle Birks for an elevated PSA .         Chest CT on 10/27/2020  demonstrated Aortic atherosclerosis . Patient has COPD consequent of continued smoking >1 PPD over 55+ years and COPD confirmed on Lung CT scan. Chest CT scan also showed new pulm nodules (<7.0 mm) in the [facility] and Radiologist recommended repeat CT Lung scan in 3 months done Sept 2023  showed no significant change & radiologist recommended continued annual f/u .  Patient does endorse chronic exertional dyspnea, but denies any hemoptysis or significant sputum production.         HTN predates since 2004. Patient's BP has been controlled at home.  Today's BP was initially slightly elevated & rechecked at goal - 136/66. 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 Patient denies any cardiac symptoms as exertional chest pain, palpitations, shortness of breath, dizziness or ankle swelling.        Patient's hyperlipidemia is not controlled with diet and ezetimibe and he has hx of Statin intolerance,   so he was started on low dose Crestor 10 mg /day.  Patient denies myalgias or other medication SE's. Last lipids were  still not at goal :  Lab Results  Component Value Date   CHOL 234 (H)  11/19/2021   HDL 50 11/19/2021   LDLCALC 144 (H) 11/19/2021   TRIG 260 (H) 11/19/2021   CHOLHDL 4.7 11/19/2021         Patient has hx/o prediabetes (A1c_5.7% _/2010) and patient denies reactive hypoglycemic symptoms, visual blurring, diabetic polys or paresthesias. Last A1c was normal & at goal :   Lab Results  Component Value Date   HGBA1C 5.4 11/19/2021         Finally, patient has history of Vitamin D Deficiency ("25" /2009) and last vitamin D on 4,000 u day  was at goal :   Lab Results  Component Value Date   VD25OH 82 11/19/2021       Current Outpatient Medications  Medication Instructions   acetaminophen  500 mg,   Every 6 hours PRN   albuterol HFA  inhaler 1-2 puffs Every 6 hours PRN   VITAMIN C  500 mg  4 times daily   aspirin EC  Daily   b complex vitamins capsule 1 capsule Daily   bisoprolol-hctz  10-6.25 MG tablet TAKE 1 TABLET EVERY DAY    VITAMIN D  2,000 Units,  2 times daily, 4000 total a day   TRELEGY ELLIPTA 100-62.5-25  Use  1 Inhalation  Daily    Magnesium 250 MG TABS Every other day   Multiple Vitamin  1 tablet, Oral, Daily, Takes a vitamin pack daily    Omega-3 OMEGA 3  Oral, Takes Omega Red   MUCINEX D  prn   saw palmetto 900 mg  Daily     Allergies  Allergen Reactions   Ace Inhibitors Cough   Ciprofloxacin    Fenofibrate     dizzy     Past Medical History:  Diagnosis Date   BPH (benign prostatic hyperplasia)    COPD (chronic obstructive pulmonary disease) (HCC)    via CT lung   GERD (gastroesophageal reflux disease)    Hyperlipidemia    Hypertension    Hypogonadism male    Myocardial infarction ([place]) 2005   Per patient no stent or PTCA, just medical treatment   Prediabetes    Vitamin D deficiency      Health Maintenance  Topic Date Due   COVID-19 Vaccine (1) Never done   Zoster Vaccines- Shingrix (1 of 2) Never done   Pneumonia Vaccine 58+ Years old (2 - PPSV23 if available, else PCV20) 03/01/2019   TETANUS/TDAP   12/07/2020   INFLUENZA VACCINE  Never done   COLONOSCOPY  01/12/2024   Hepatitis C Screening  Completed   HPV VACCINES  Aged Out     Immunization History  Administered Date(s) Administered   PPD Test 12/25/2013   Pneumococcal -13 02/28/2018   Pneumococcal-23 12/08/2010   Tdap 12/08/2010    Last Colon - 01/12/2019 - Dr Nicki Reaper [facility] - recc 5 year f/u due Aug /Sept 2025.   Past Surgical History:  Procedure Laterality Date   AMPUTATION FINGER / THUMB Left 1991   distal index   CARDIAC CATHETERIZATION N/A 2005   Per patient no stent or PTCA, just medical treatment   CATARACT EXTRACTION, BILATERAL Bilateral 2014   Dr. Talbert Forest    colonoscopy N/A 10/2012   Neg and recc f/u Cololnoscopy in 5 yeary - Dr Nicki Reaper [place] - Gastroenterologist   PROSTATE SURGERY     Biopsy X 3   VASECTOMY       Family History  Problem Relation Age of Onset   Cancer Father 47       lymphoma   Cancer Maternal Aunt        Lung   Cancer Maternal Uncle        Lung   Cancer Maternal Uncle        Lung   Stroke Maternal Grandmother    Heart failure Maternal Grandfather    Pneumonia Paternal Grandmother    Liver disease Paternal Grandfather    Stroke Son 41   Other Son        internal intestinal hemorage     Social History   Tobacco Use   Smoking status: Every Day    Packs/day: 1.00    Years: 45.00    Pack years: 45.00    Types: Cigarettes    Start date: 1965   Smokeless tobacco: Never  Substance Use Topics   Alcohol use: Yes    Alcohol/week: 5.0 standard drinks    Types: 5 Cans of beer per week   Drug use: No      ROS Constitutional: Denies fever, chills, weight loss/gain, headaches, insomnia,  night sweats or change in appetite. Does c/o fatigue. Eyes: Denies redness, blurred vision, diplopia, discharge, itchy or watery eyes.  ENT: Denies discharge, congestion, post nasal drip, epistaxis, sore throat,  earache, hearing loss, dental pain, Tinnitus, Vertigo, Sinus pain  or snoring.  Cardio: Denies chest pain, palpitations, irregular heartbeat, syncope, dyspnea, diaphoresis, orthopnea, PND, claudication or edema Respiratory: denies cough, dyspnea, DOE, pleurisy, hoarseness, laryngitis or wheezing.  Gastrointestinal: Denies dysphagia, heartburn, reflux, water brash, pain, cramps, nausea, vomiting, bloating, diarrhea, constipation, hematemesis, melena, hematochezia, jaundice or hemorrhoids Genitourinary: Denies dysuria, frequency, urgency, nocturia, hesitancy, discharge, hematuria or flank pain Musculoskeletal: Denies arthralgia, myalgia, stiffness, Jt. Swelling, pain, limp or strain/sprain. Denies Falls. Skin: Denies puritis, rash, hives, warts, acne, eczema or change in skin lesion Neuro: No weakness, tremor, incoordination, spasms, paresthesia or pain Psychiatric: Denies confusion, memory loss or sensory loss. Denies Depression. Endocrine: Denies change in weight, skin, hair change, nocturia, and paresthesia, diabetic polys, visual blurring or hyper / hypo glycemic episodes.  Heme/Lymph: No excessive bleeding, bruising or enlarged lymph nodes.   Physical Exam  BP 136/66   Pulse 70   Temp 97.7 F (36.5 C)   Resp 16   Ht 6' (1.829 m)   Wt 166 lb 3.2 oz (75.4 kg)   SpO2 99%   BMI 22.54 kg/m   General Appearance: Well nourished and well groomed and in no apparent distress.  Eyes: PERRLA, EOMs, conjunctiva no swelling or erythema, normal fundi and vessels. Sinuses: No frontal/maxillary tenderness ENT/Mouth: EACs patent / TMs  nl. Nares clear without erythema, swelling, mucoid exudates. Oral hygiene is good. No erythema, swelling, or exudate. Tongue normal, non-obstructing. Tonsils not swollen or erythematous. Hearing normal.  Neck: Supple, thyroid not palpable. No bruits, nodes or JVD. Respiratory: Respiratory effort normal.  BS equal and clear bilateral without rales, rhonci, wheezing or stridor. Cardio: Heart sounds are normal with regular rate and  rhythm and no murmurs, rubs or gallops. Peripheral pulses are normal and equal bilaterally without edema. No aortic or femoral bruits. Chest: symmetric with normal excursions and percussion.  Abdomen: Soft, with Nl bowel sounds. Nontender, no guarding, rebound, hernias, masses, or organomegaly.  Lymphatics: Non tender without lymphadenopathy.  Musculoskeletal: Full ROM all peripheral extremities, joint stability, 5/5 strength, and normal gait. Skin: Warm and dry without rashes, lesions, cyanosis, clubbing or  ecchymosis.  Neuro: Cranial nerves intact, reflexes equal bilaterally. Normal muscle tone, no cerebellar symptoms. Sensation intact.  Pysch: Alert and oriented X 3 with normal affect, insight and judgment appropriate.   Assessment and Plan  1. Annual Preventative/Screening Exam    2. Essential hypertension  - EKG 12-Lead - [place], RETROPERITNL ABD,  LTD - Urinalysis, Routine w reflex microscopic - Microalbumin / creatinine urine ratio - CBC with Differential/Platelet - COMPLETE METABOLIC PANEL WITH GFR - Magnesium - TSH  3. Hyperlipidemia, mixed  - EKG 12-Lead - [place], RETROPERITNL ABD,  LTD - Lipid panel - TSH  4. Abnormal glucose  - EKG 12-Lead - [place], RETROPERITNL ABD,  LTD - Hemoglobin A1c - Insulin, random  5. Vitamin D deficiency  - VITAMIN D 25 Hydroxy   6. ASHD s/p Stent  - EKG 12-Lead - Lipid panel  7. Aortic atherosclerosis ([place]) by Chest CT scan on 10/27/2020  - EKG 12-Lead - Lipid panel  8. BPH with obstruction/lower urinary tract symptoms  - PSA  9. Prostate cancer screening  - PSA  10. Chronic obstructive pulmonary disease, HCC)  - Discouraged smoking & offered smoking cessation techniques   11. Screening for colorectal cancer  - POC Hemoccult Bld/Stl   12. Screening for ischemic heart disease  - EKG 12-Lead  13. FHx: heart disease  - EKG 12-Lead -  [place], RETROPERITNL ABD,  LTD  14. Screening for AAA (aortic abdominal aneurysm)  -  [place], RETROPERITNL ABD,  LTD  15. Smoker  - EKG 12-Lead - [place], RETROPERITNL ABD,  LTD  16. Medication management  - Urinalysis, Routine w reflex microscopic - Microalbumin / creatinine urine ratio - PSA - CBC with Differential/Platelet - COMPLETE METABOLIC PANEL WITH GFR - Magnesium - Lipid panel - TSH - Hemoglobin A1c - Insulin, random - VITAMIN D 25 Hydroxy           Patient was counseled in prudent diet, weight control to achieve/maintain BMI less than 25, BP monitoring, regular exercise and medications as discussed.  Discussed med effects and SE's. Routine screening labs and tests as requested with regular follow-up as recommended. Over 40 minutes of exam, counseling, chart review and high complex critical decision making was performed   Kirtland Bouchard, MD

## 2022-05-17 NOTE — Patient Instructions (Signed)

## 2022-05-18 ENCOUNTER — Ambulatory Visit (INDEPENDENT_AMBULATORY_CARE_PROVIDER_SITE_OTHER): Payer: Medicare PPO | Admitting: Internal Medicine

## 2022-05-18 ENCOUNTER — Encounter: Payer: Self-pay | Admitting: Internal Medicine

## 2022-05-18 VITALS — BP 136/66 | HR 70 | Temp 97.7°F | Resp 16 | Ht 72.0 in | Wt 166.2 lb

## 2022-05-18 DIAGNOSIS — N138 Other obstructive and reflux uropathy: Secondary | ICD-10-CM

## 2022-05-18 DIAGNOSIS — I10 Essential (primary) hypertension: Secondary | ICD-10-CM | POA: Diagnosis not present

## 2022-05-18 DIAGNOSIS — Z0000 Encounter for general adult medical examination without abnormal findings: Secondary | ICD-10-CM

## 2022-05-18 DIAGNOSIS — I700 Atherosclerosis of aorta: Secondary | ICD-10-CM

## 2022-05-18 DIAGNOSIS — Z136 Encounter for screening for cardiovascular disorders: Secondary | ICD-10-CM

## 2022-05-18 DIAGNOSIS — Z0001 Encounter for general adult medical examination with abnormal findings: Secondary | ICD-10-CM

## 2022-05-18 DIAGNOSIS — I2510 Atherosclerotic heart disease of native coronary artery without angina pectoris: Secondary | ICD-10-CM

## 2022-05-18 DIAGNOSIS — Z8249 Family history of ischemic heart disease and other diseases of the circulatory system: Secondary | ICD-10-CM

## 2022-05-18 DIAGNOSIS — F17200 Nicotine dependence, unspecified, uncomplicated: Secondary | ICD-10-CM

## 2022-05-18 DIAGNOSIS — R7309 Other abnormal glucose: Secondary | ICD-10-CM

## 2022-05-18 DIAGNOSIS — E782 Mixed hyperlipidemia: Secondary | ICD-10-CM | POA: Diagnosis not present

## 2022-05-18 DIAGNOSIS — J449 Chronic obstructive pulmonary disease, unspecified: Secondary | ICD-10-CM

## 2022-05-18 DIAGNOSIS — E559 Vitamin D deficiency, unspecified: Secondary | ICD-10-CM

## 2022-05-18 DIAGNOSIS — Z1211 Encounter for screening for malignant neoplasm of colon: Secondary | ICD-10-CM

## 2022-05-18 DIAGNOSIS — Z79899 Other long term (current) drug therapy: Secondary | ICD-10-CM

## 2022-05-18 DIAGNOSIS — R918 Other nonspecific abnormal finding of lung field: Secondary | ICD-10-CM

## 2022-05-19 LAB — CBC WITH DIFFERENTIAL/PLATELET
Absolute Monocytes: 1090 cells/uL — ABNORMAL HIGH (ref 200–950)
Basophils Absolute: 131 cells/uL (ref 0–200)
Basophils Relative: 1.2 %
Eosinophils Absolute: 120 cells/uL (ref 15–500)
Eosinophils Relative: 1.1 %
HCT: 41.7 % (ref 38.5–50.0)
Hemoglobin: 14.2 g/dL (ref 13.2–17.1)
Lymphs Abs: 2093 cells/uL (ref 850–3900)
MCH: 32.4 pg (ref 27.0–33.0)
MCHC: 34.1 g/dL (ref 32.0–36.0)
MCV: 95.2 fL (ref 80.0–100.0)
MPV: 9.4 fL (ref 7.5–12.5)
Monocytes Relative: 10 %
Neutro Abs: 7467 cells/uL (ref 1500–7800)
Neutrophils Relative %: 68.5 %
Platelets: 271 10*3/uL (ref 140–400)
RBC: 4.38 10*6/uL (ref 4.20–5.80)
RDW: 12.1 % (ref 11.0–15.0)
Total Lymphocyte: 19.2 %
WBC: 10.9 10*3/uL — ABNORMAL HIGH (ref 3.8–10.8)

## 2022-05-19 LAB — URINALYSIS, ROUTINE W REFLEX MICROSCOPIC
Bilirubin Urine: NEGATIVE
Glucose, UA: NEGATIVE
Hgb urine dipstick: NEGATIVE
Ketones, ur: NEGATIVE
Leukocytes,Ua: NEGATIVE
Nitrite: NEGATIVE
Protein, ur: NEGATIVE
Specific Gravity, Urine: 1.008 (ref 1.001–1.035)
pH: 7 (ref 5.0–8.0)

## 2022-05-19 LAB — COMPLETE METABOLIC PANEL WITH GFR
AG Ratio: 1.6 (calc) (ref 1.0–2.5)
ALT: 15 U/L (ref 9–46)
AST: 18 U/L (ref 10–35)
Albumin: 3.9 g/dL (ref 3.6–5.1)
Alkaline phosphatase (APISO): 83 U/L (ref 35–144)
BUN: 13 mg/dL (ref 7–25)
CO2: 30 mmol/L (ref 20–32)
Calcium: 9.3 mg/dL (ref 8.6–10.3)
Chloride: 104 mmol/L (ref 98–110)
Creat: 0.89 mg/dL (ref 0.70–1.28)
Globulin: 2.4 g/dL (calc) (ref 1.9–3.7)
Glucose, Bld: 86 mg/dL (ref 65–99)
Potassium: 4.9 mmol/L (ref 3.5–5.3)
Sodium: 142 mmol/L (ref 135–146)
Total Bilirubin: 0.6 mg/dL (ref 0.2–1.2)
Total Protein: 6.3 g/dL (ref 6.1–8.1)
eGFR: 91 mL/min/{1.73_m2} (ref >=60)

## 2022-05-19 LAB — LIPID PANEL
Cholesterol: 252 mg/dL — ABNORMAL HIGH (ref <200)
HDL: 55 mg/dL (ref >=40)
LDL Cholesterol (Calc): 156 mg/dL (calc) — ABNORMAL HIGH
Non-HDL Cholesterol (Calc): 197 mg/dL (calc) — ABNORMAL HIGH (ref <130)
Total CHOL/HDL Ratio: 4.6 (calc) (ref <5.0)
Triglycerides: 242 mg/dL — ABNORMAL HIGH (ref <150)

## 2022-05-19 LAB — HEMOGLOBIN A1C
Hgb A1c MFr Bld: 5.9 % of total Hgb — ABNORMAL HIGH (ref <5.7)
Mean Plasma Glucose: 123 mg/dL
eAG (mmol/L): 6.8 mmol/L

## 2022-05-19 LAB — VITAMIN D 25 HYDROXY (VIT D DEFICIENCY, FRACTURES): Vit D, 25-Hydroxy: 84 ng/mL (ref 30–100)

## 2022-05-19 LAB — MICROALBUMIN / CREATININE URINE RATIO
Creatinine, Urine: 40 mg/dL (ref 20–320)
Microalb, Ur: <0.2 mg/dL

## 2022-05-19 LAB — MAGNESIUM: Magnesium: 2.2 mg/dL (ref 1.5–2.5)

## 2022-05-19 LAB — TSH: TSH: 1.51 mIU/L (ref 0.40–4.50)

## 2022-05-19 LAB — INSULIN, RANDOM: Insulin: 7.2 u[IU]/mL

## 2022-05-19 NOTE — Progress Notes (Signed)
/<><><><><><><><><><><><><><><><><><><><><><><><><><><><><><><><><> <><><><><><><><><><><><><><><><><><><><><><><><><><><><><><><><><>  -   A1c = 5.9%  is borderline slightly elevated blood sugars , So . .  . .  . [place] Kitchen  - Avoid Sweets, Candy & White Stuff   - White Rice, White [place], White Flour  - Breads &  Pasta <><><><><><><><><><><><><><><><><><><><><><><><><><><><><><><><><> <><><><><><><><><><><><><><><><><><><><><><><><><><><><><><><><><>  - Vit D = 84 - Excellent   - Please Keep dose same  <><><><><><><><><><><><><><><><><><><><><><><><><><><><><><><><><> <><><><><><><><><><><><><><><><><><><><><><><><><><><><><><><><><>  -  All Else - CBC - Kidneys - Electrolytes - Liver - Magnesium & Thyroid    - all  Normal / OK <><><><><><><><><><><><><><><><><><><><><><><><><><><><><><><><><> <><><><><><><><><><><><><><><><><><><><><><><><><><><><><><><><><>  -  Essentially ALL Labs are [place]  !   - Keep up the [place] Work   <><><><><><><><><><><><><><><><><><><><><><><><><><><><><><><><><> <><><><><><><><><><><><><><><><><><><><><><><><><><><><><><><><><>

## 2022-05-20 NOTE — Progress Notes (Signed)
Patient is aware of lab results and instructions. -e welch

## 2022-06-04 ENCOUNTER — Ambulatory Visit
Admission: RE | Admit: 2022-06-04 | Discharge: 2022-06-04 | Disposition: A | Discharge status: Home / Self Care | Payer: Medicare PPO | Source: Ambulatory Visit | Attending: Nurse Practitioner | Admitting: Nurse Practitioner

## 2022-06-04 DIAGNOSIS — F17200 Nicotine dependence, unspecified, uncomplicated: Secondary | ICD-10-CM

## 2022-06-04 DIAGNOSIS — F17210 Nicotine dependence, cigarettes, uncomplicated: Secondary | ICD-10-CM | POA: Diagnosis not present

## 2022-06-04 DIAGNOSIS — R042 Hemoptysis: Secondary | ICD-10-CM

## 2022-06-04 DIAGNOSIS — J449 Chronic obstructive pulmonary disease, unspecified: Secondary | ICD-10-CM

## 2022-06-04 DIAGNOSIS — R911 Solitary pulmonary nodule: Secondary | ICD-10-CM

## 2022-06-04 DIAGNOSIS — M549 Dorsalgia, unspecified: Secondary | ICD-10-CM

## 2022-06-08 ENCOUNTER — Other Ambulatory Visit: Payer: Self-pay | Admitting: Nurse Practitioner

## 2022-06-08 DIAGNOSIS — R911 Solitary pulmonary nodule: Secondary | ICD-10-CM

## 2022-06-24 ENCOUNTER — Encounter: Payer: Self-pay | Admitting: Pulmonary Disease

## 2022-06-24 ENCOUNTER — Ambulatory Visit: Payer: Medicare PPO | Admitting: Pulmonary Disease

## 2022-06-24 VITALS — BP 140/80 | HR 69 | Ht 71.0 in | Wt 170.6 lb

## 2022-06-24 DIAGNOSIS — R911 Solitary pulmonary nodule: Secondary | ICD-10-CM | POA: Diagnosis not present

## 2022-06-24 DIAGNOSIS — Z716 Tobacco abuse counseling: Secondary | ICD-10-CM

## 2022-06-24 DIAGNOSIS — F17210 Nicotine dependence, cigarettes, uncomplicated: Secondary | ICD-10-CM | POA: Diagnosis not present

## 2022-06-24 DIAGNOSIS — Z720 Tobacco use: Secondary | ICD-10-CM

## 2022-06-24 MED ORDER — BUPROPION HCL ER (SR) 150 MG PO TB12: 150.0000 mg | ORAL_TABLET | Freq: Two times a day (BID) | ORAL | 2 refills | Status: DC

## 2022-06-24 NOTE — Progress Notes (Signed)
Synopsis: Referred in Jan 2024 for Lung nodule by Darrol Jump, NP  Subjective:   PATIENT ID: Michael [name] GENDER: male DOB: Oct 05, 1950, MRN: [id number]  Chief Complaint  Patient presents with   Consult    Lung nodule of left side.    This is a 72 year old gentleman past medical history of COPD, gastroesophageal reflux hyperlipidemia, hypertension.Patient was referred after abnormal lung cancer screening CT completed on 06/04/2022.  Patient was found to have innumerable small calcified and noncalcified pulmonary nodules that were again noted.  There was a new nodule in the left upper lobe which led to concern.  He has a mean dry volume of 8.4 mm has macrolobulated margins.  It is concerning for small neoplasm.  He has extensive bilateral pleural parenchymal thickening and evidence of centrilobular and paraseptal emphysema.  Patient has smoked for 50+ years.  Still smoking 1 pack/day.  Has been diagnosed with COPD in the past based on the prior DOT physical.  And that he is currently managed with Trelegy and as needed albuterol.    Past Medical History:  Diagnosis Date   BPH (benign prostatic hyperplasia)    COPD (chronic obstructive pulmonary disease) (HCC)    via CT lung   GERD (gastroesophageal reflux disease)    Hyperlipidemia    Hypertension    Hypogonadism male    Myocardial infarction ([place]) 2005   Per patient no stent or PTCA, just medical treatment   Prediabetes    Vitamin D deficiency      Family History  Problem Relation Age of Onset   Cancer Father 89       lymphoma   Cancer Maternal Aunt        Lung   Cancer Maternal Uncle        Lung   Cancer Maternal Uncle        Lung   Stroke Maternal Grandmother    Heart failure Maternal Grandfather    Pneumonia Paternal Grandmother    Liver disease Paternal Grandfather    Stroke Son 22   Other Son        internal intestinal hemorage     Past Surgical History:  Procedure Laterality Date   AMPUTATION FINGER /  THUMB Left 1991   distal index   CARDIAC CATHETERIZATION N/A 2005   Per patient no stent or PTCA, just medical treatment   CATARACT EXTRACTION, BILATERAL Bilateral 2014   Dr. Talbert Forest    colonoscopy N/A 10/2012   Neg and recc f/u Cololnoscopy in 5 yeary - Dr Nicki Reaper [place] - Gastroenterologist   PROSTATE SURGERY     Biopsy X 3   VASECTOMY      Social History   Socioeconomic History   Marital status: Divorced    Spouse name: Not on file   Number of children: Not on file   Years of education: Not on file   Highest education level: Not on file  Occupational History   Not on file  Tobacco Use   Smoking status: Every Day    Packs/day: 1.00    Years: 45.00    Total pack years: 45.00    Types: Cigarettes    Start date: 1965   Smokeless tobacco: Never  Substance and Sexual Activity   Alcohol use: Yes    Alcohol/week: 5.0 standard drinks of alcohol    Types: 5 Cans of beer per week   Drug use: No   Sexual activity: Not on file  Other Topics Concern   Not  on file  Social History Narrative   Epworth Sleepiness score 5    Social Determinants of Health   Financial Resource Strain: Not on file  Food Insecurity: Not on file  Transportation Needs: Not on file  Physical Activity: Not on file  Stress: Not on file  Social Connections: Not on file  Intimate Partner Violence: Not on file     Allergies  Allergen Reactions   Ace Inhibitors Cough   Ciprofloxacin    Fenofibrate     dizzy     Outpatient Medications Prior to Visit  Medication Sig Dispense Refill   albuterol (VENTOLIN HFA) 108 (90 Base) MCG/ACT inhaler Inhale 1-2 puffs into the lungs every 6 (six) hours as needed for wheezing or shortness of breath. 54 g 1   Fluticasone-Umeclidin-Vilant (TRELEGY ELLIPTA) 100-62.5-25 MCG/ACT AEPB Use  1 Inhalation  Daily  for COPD / Asthma 180 each 3   Pseudoephedrine-guaiFENesin (MUCINEX D PO) Take by mouth.     acetaminophen (TYLENOL) 500 MG tablet Take 500 mg by mouth every 6  (six) hours as needed.     ascorbic acid (VITAMIN C) 500 MG tablet Take 500 mg by mouth 4 (four) times daily.     aspirin EC 81 MG tablet Take 81 mg by mouth daily.     b complex vitamins capsule Take 1 capsule by mouth daily.     bisoprolol-hydrochlorothiazide (ZIAC) 10-6.25 MG tablet TAKE 1 TABLET EVERY DAY FOR BLOOD PRESSURE (Patient taking differently: 0.5 tablets. TAKE 1 TABLET EVERY DAY FOR BLOOD PRESSURE) 90 tablet 3   cholecalciferol (VITAMIN D) 1000 UNITS tablet 2,000 Units in the morning, at noon, and at bedtime. 4000 total a day     Magnesium 250 MG TABS Take by mouth every other day.     Multiple Vitamin (MULTIVITAMIN) tablet Take 1 tablet by mouth daily. Takes a vitamin pack daily     Omega-3 Fatty Acids (OMEGA 3 PO) Take by mouth. Takes Omega Red     saw palmetto 500 MG capsule Take 900 mg by mouth daily.     No facility-administered medications prior to visit.    Review of Systems  Constitutional:  Negative for chills, fever, malaise/fatigue and weight loss.  HENT:  Negative for hearing loss, sore throat and tinnitus.   Eyes:  Negative for blurred vision and double vision.  Respiratory:  Positive for shortness of breath. Negative for cough, hemoptysis, sputum production, wheezing and stridor.   Cardiovascular:  Negative for chest pain, palpitations, orthopnea, leg swelling and PND.  Gastrointestinal:  Negative for abdominal pain, constipation, diarrhea, heartburn, nausea and vomiting.  Genitourinary:  Negative for dysuria, hematuria and urgency.  Musculoskeletal:  Negative for joint pain and myalgias.  Skin:  Negative for itching and rash.  Neurological:  Negative for dizziness, tingling, weakness and headaches.  Endo/Heme/Allergies:  Negative for environmental allergies. Does not bruise/bleed easily.  Psychiatric/Behavioral:  Negative for depression. The patient is not nervous/anxious and does not have insomnia.   All other systems reviewed and are  negative.    Objective:  Physical Exam Vitals reviewed.  Constitutional:      General: He is not in acute distress.    Appearance: He is well-developed.  HENT:     Head: Normocephalic and atraumatic.  Eyes:     General: No scleral icterus.    Conjunctiva/sclera: Conjunctivae normal.     Pupils: Pupils are equal, round, and reactive to light.  Neck:     Vascular: No JVD.  Trachea: No tracheal deviation.  Cardiovascular:     Rate and Rhythm: Normal rate and regular rhythm.     Heart sounds: Normal heart sounds. No murmur heard. Pulmonary:     Effort: Pulmonary effort is normal. No tachypnea, accessory muscle usage or respiratory distress.     Breath sounds: No stridor. No wheezing, rhonchi or rales.     Comments: Severely diminished breath sounds bilaterally Abdominal:     General: There is no distension.     Palpations: Abdomen is soft.     Tenderness: There is no abdominal tenderness.  Musculoskeletal:        General: No tenderness.     Cervical back: Neck supple.  Lymphadenopathy:     Cervical: No cervical adenopathy.  Skin:    General: Skin is warm and dry.     Capillary Refill: Capillary refill takes less than 2 seconds.     Findings: No rash.  Neurological:     Mental Status: He is alert and oriented to person, place, and time.  Psychiatric:        Behavior: Behavior normal.      Vitals:   06/24/22 1005  BP: (!) 140/80  Pulse: 69  SpO2: 98%  Weight: 170 lb 9.6 oz (77.4 kg)  Height: '5\' 11"'$  (1.803 m)   98% on RA BMI Readings from Last 3 Encounters:  06/24/22 23.79 kg/m  05/18/22 22.54 kg/m  05/03/22 22.65 kg/m   Wt Readings from Last 3 Encounters:  06/24/22 170 lb 9.6 oz (77.4 kg)  05/18/22 166 lb 3.2 oz (75.4 kg)  05/03/22 167 lb (75.8 kg)     CBC    Component Value Date/Time   WBC 10.9 (H) 05/18/2022 0000   RBC 4.38 05/18/2022 0000   HGB 14.2 05/18/2022 0000   HCT 41.7 05/18/2022 0000   PLT 271 05/18/2022 0000   MCV 95.2 05/18/2022  0000   MCH 32.4 05/18/2022 0000   MCHC 34.1 05/18/2022 0000   RDW 12.1 05/18/2022 0000   LYMPHSABS 2,093 05/18/2022 0000   MONOABS 768 11/08/2016 0854   EOSABS 120 05/18/2022 0000   BASOSABS 131 05/18/2022 0000    Chest Imaging: Lung cancer screening CT January 2024: 8.4 mm macrolobulated nodule in the left upper lobe The patient's images have been independently reviewed by me.    Pulmonary Functions Testing Results:     No data to display          FeNO:   Pathology:   Echocardiogram:   Heart Catheterization:     Assessment & Plan:     ICD-10-CM   1. Nodule of upper lobe of left lung  R91.1 NM PET Image Initial (PI) Skull Base To Thigh (F-18 FDG)    Pulmonary Function Test    2. Tobacco use  Z72.0     3. Encounter for smoking cessation counseling  Z71.6       Discussion:  This is a 72 year old gentleman, macrolobulated 8 mm nodule in the left upper lobe with associated paraseptal and centrilobular emphysema.  Plan: This lesion is high risk for being malignant. We discussed this today in the office. I think next best step to include a nuclear medicine PET scan as well as full pulmonary function test. He may be a good candidate for surgical resection in the future. He has however still smoking significantly.  We talked about various smoking cessation options.  Please see separate documentation regarding that.  Continue Trelegy, continue albuterol as needed. Patient to return  to clinic in approximately 4 weeks after PET scan and PFTs have been completed.   Current Outpatient Medications:    albuterol (VENTOLIN HFA) 108 (90 Base) MCG/ACT inhaler, Inhale 1-2 puffs into the lungs every 6 (six) hours as needed for wheezing or shortness of breath., Disp: 54 g, Rfl: 1   buPROPion (WELLBUTRIN SR) 150 MG 12 hr tablet, Take 1 tablet (150 mg total) by mouth 2 (two) times daily. Take one tab daily for 3 days then increase to twice daily, Disp: 60 tablet, Rfl: 2    Fluticasone-Umeclidin-Vilant (TRELEGY ELLIPTA) 100-62.5-25 MCG/ACT AEPB, Use  1 Inhalation  Daily  for COPD / Asthma, Disp: 180 each, Rfl: 3   Pseudoephedrine-guaiFENesin (MUCINEX D PO), Take by mouth., Disp: , Rfl:    acetaminophen (TYLENOL) 500 MG tablet, Take 500 mg by mouth every 6 (six) hours as needed., Disp: , Rfl:    ascorbic acid (VITAMIN C) 500 MG tablet, Take 500 mg by mouth 4 (four) times daily., Disp: , Rfl:    aspirin EC 81 MG tablet, Take 81 mg by mouth daily., Disp: , Rfl:    b complex vitamins capsule, Take 1 capsule by mouth daily., Disp: , Rfl:    bisoprolol-hydrochlorothiazide (ZIAC) 10-6.25 MG tablet, TAKE 1 TABLET EVERY DAY FOR BLOOD PRESSURE (Patient taking differently: 0.5 tablets. TAKE 1 TABLET EVERY DAY FOR BLOOD PRESSURE), Disp: 90 tablet, Rfl: 3   cholecalciferol (VITAMIN D) 1000 UNITS tablet, 2,000 Units in the morning, at noon, and at bedtime. 4000 total a day, Disp: , Rfl:    Magnesium 250 MG TABS, Take by mouth every other day., Disp: , Rfl:    Multiple Vitamin (MULTIVITAMIN) tablet, Take 1 tablet by mouth daily. Takes a vitamin pack daily, Disp: , Rfl:    Omega-3 Fatty Acids (OMEGA 3 PO), Take by mouth. Takes Omega Red, Disp: , Rfl:    saw palmetto 500 MG capsule, Take 900 mg by mouth daily., Disp: , Rfl:    Garner Nash, DO [facility] 06/24/2022 10:21 AM

## 2022-06-24 NOTE — Patient Instructions (Addendum)
Thank you for visiting Dr. Valeta Harms at [facility]. Today we recommend the following:  Orders Placed This Encounter  Procedures   NM PET Image Initial (PI) Skull Base To Thigh (F-18 FDG)   Pulmonary Function Test   Meds ordered this encounter  Medications   buPROPion (WELLBUTRIN SR) 150 MG 12 hr tablet    Sig: Take 1 tablet (150 mg total) by mouth 2 (two) times daily. Take one tab daily for 3 days then increase to twice daily    Dispense:  60 tablet    Refill:  2   Return in about 4 weeks (around 07/22/2022) for with Eric Form, NP, or Dr. Valeta Harms. Please have PET and PFT prior to next office visit     Please do your part to reduce the spread of COVID-19.

## 2022-06-24 NOTE — Progress Notes (Signed)
Smoking Cessation Counseling:   The patient's current tobacco use: 1 pack/day for greater than 50 years The patient was advised to quit and impact of smoking on their health.  I assessed the patient's willingness to attempt to quit. I provided methods and skills for cessation. We reviewed medication management of smoking session drugs if appropriate. Resources to help quit smoking were provided. A smoking cessation quit date was set: 1 to 2 months Follow-up was arranged in our clinic.  The amount of time spent counseling patient was 5 mins  We talked about various options including tapering. He is interested in quitting at least. He is agreeable to start back Wellbutrin which she has used in the past.  Garner Nash, DO [facility] 06/24/2022 10:21 AM

## 2022-07-05 ENCOUNTER — Encounter (HOSPITAL_COMMUNITY)
Admission: RE | Admit: 2022-07-05 | Discharge: 2022-07-05 | Disposition: A | Discharge status: Home / Self Care | Payer: Medicare PPO | Source: Ambulatory Visit | Attending: Pulmonary Disease | Admitting: Pulmonary Disease

## 2022-07-05 DIAGNOSIS — R911 Solitary pulmonary nodule: Secondary | ICD-10-CM | POA: Insufficient documentation

## 2022-07-05 LAB — GLUCOSE, CAPILLARY: Glucose-Capillary: 93 mg/dL (ref 70–99)

## 2022-07-05 MED ORDER — FLUDEOXYGLUCOSE F - 18 (FDG) INJECTION
8.5000 | Freq: Once | INTRAVENOUS | Status: AC | PRN
  Administered 2022-07-05: 8.5 via INTRAVENOUS

## 2022-07-09 ENCOUNTER — Ambulatory Visit: Payer: Medicare PPO | Admitting: Nurse Practitioner

## 2022-07-09 ENCOUNTER — Encounter: Payer: Self-pay | Admitting: Nurse Practitioner

## 2022-07-09 ENCOUNTER — Telehealth: Payer: Self-pay | Admitting: Nurse Practitioner

## 2022-07-09 VITALS — BP 132/74 | HR 67 | Temp 97.8°F | Ht 71.0 in | Wt 172.2 lb

## 2022-07-09 DIAGNOSIS — Z720 Tobacco use: Secondary | ICD-10-CM

## 2022-07-09 DIAGNOSIS — K118 Other diseases of salivary glands: Secondary | ICD-10-CM | POA: Insufficient documentation

## 2022-07-09 DIAGNOSIS — R948 Abnormal results of function studies of other organs and systems: Secondary | ICD-10-CM | POA: Insufficient documentation

## 2022-07-09 DIAGNOSIS — J449 Chronic obstructive pulmonary disease, unspecified: Secondary | ICD-10-CM | POA: Diagnosis not present

## 2022-07-09 DIAGNOSIS — I7143 Infrarenal abdominal aortic aneurysm, without rupture: Secondary | ICD-10-CM | POA: Insufficient documentation

## 2022-07-09 DIAGNOSIS — R918 Other nonspecific abnormal finding of lung field: Secondary | ICD-10-CM

## 2022-07-09 DIAGNOSIS — N400 Enlarged prostate without lower urinary tract symptoms: Secondary | ICD-10-CM | POA: Insufficient documentation

## 2022-07-09 NOTE — Assessment & Plan Note (Signed)
Prostatomegaly and hypermetabolism noted on PET scan. He is followed by urology with monitoring of his PSA. I will send them a message to make them aware of these findings and determine next steps. He will also call their office to schedule sooner follow up.

## 2022-07-09 NOTE — Assessment & Plan Note (Signed)
Incidental finding. Will route to his PCP for recommended monitoring every 3 years.

## 2022-07-09 NOTE — Progress Notes (Signed)
$@PatientG$  ID: Michael [name], male    DOB: [birth year], 72 y.o.   MRN: [id number]  Chief Complaint  Patient presents with   Follow-up    Pet review     Referring provider: Unk Pinto, MD  HPI: 72 year old male, active smoker followed for nodule of left upper lobe and severe emphysema. He is a patient of Dr. Juline Patch and last seen in office 06/24/2022. Past medical history significant for HTN, CAD, BPH, elevated PSA, vitamin d deficiency, HLD.   TEST/EVENTS:  06/04/2022 LDCT chest lung cancer screening: Atherosclerosis.  Multiple densely calcified mediastinal and hilar lymph nodes.  Innumerable small calcified and noncalcified pulmonary nodules scattered throughout the lungs bilaterally.  There is a new left upper lobe new nodule with diameter of 8.4 mm.  This has macrolobulated and slightly spiculated margins, concerning for small neoplasm.  Diffuse bronchial wall thickening with moderate centrilobular and paraseptal emphysema.  Extensive bilateral apical pleural-parenchymal thickening and nodular architectural distortion, similar to prior studies and most compatible with areas of chronic postinfectious or inflammatory scarring. 07/05/2022 PET scan: 3 small hypermetabolic left parotid nodules.  Left upper lobe pulmonary nodule of interest is hypermetabolic.  Gastric fundal mild hypermetabolism with SUV max of 5.3.  Right prostatic hypermetabolism.  Atherosclerosis.  Infrarenal aortic dilatation of 3.2 cm.  Recommend follow-up ultrasound every 3 years.  Atherosclerosis, emphysema and prostatomegaly are incidental findings.  06/24/2022: OV with Dr. Valeta Harms for initial consult.  Abnormal lung cancer screening CT completed on 06/04/2022.  Found to have innumerable small calcified and noncalcified pulmonary nodules that were again noted.  There is a new nodule in the left upper lobe, measuring 8.4 mm, which led to concern for small neoplasm.  Has bilateral pleural parenchymal thickening and evidence of  centrilobular and paraseptal emphysema.  Has a significant smoking history.  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  Currently managed with Trelegy.  Patient was set up for PET scan.  May be a candidate for surgical resection in the future; however, he is still smoking significantly.  Talked about smoking cessation options.  Started on Wellbutrin for this.  Ordered formal pulmonary function testing.  07/09/2022: Today-follow-up Patient presents today for follow-up to discuss PET scan results.  The left upper lobe nodule of concern was noted to be hypermetabolic.  He was also found to have 3 small hypermetabolic left parotid nodules and hypermetabolism of the prostate.  He also had some mild gastric fundal hypermetabolism which was nonspecific.  Today, he tells me that he feels relatively unchanged compared to when he was here last.  He denies any hemoptysis, fevers, night sweats, weight loss.  He does not have any difficulties with swallowing or swelling in his neck.  He is followed by urology for elevated PSA and BPH.  His last check in October 2023 was 6.6 which was decreased from prior levels.  He has plans to see them again in April.  He is still smoking about a pack a day.  He started the Wellbutrin yesterday.  Allergies  Allergen Reactions   Ace Inhibitors Cough   Ciprofloxacin    Fenofibrate     dizzy    Immunization History  Administered Date(s) Administered   PPD Test 12/25/2013   Pneumococcal Conjugate-13 02/28/2018   Pneumococcal-Unspecified 12/08/2010   Td 08/07/2021   Tdap 12/08/2010    Past Medical History:  Diagnosis Date   BPH (benign prostatic hyperplasia)    COPD (chronic obstructive pulmonary disease) ([place])  via CT lung   GERD (gastroesophageal reflux disease)    Hyperlipidemia    Hypertension    Hypogonadism male    Myocardial infarction [facility]) 2005   Per patient no stent or PTCA, just medical treatment   Prediabetes    Vitamin D deficiency      Tobacco History: Social History   Tobacco Use  Smoking Status Every Day   Packs/day: 1.00   Years: 45.00   Total pack years: 45.00   Types: Cigarettes   Start date: 1965  Smokeless Tobacco Never   Ready to quit: Not Answered Counseling given: Not Answered   Outpatient Medications Prior to Visit  Medication Sig Dispense Refill   acetaminophen (TYLENOL) 500 MG tablet Take 500 mg by mouth every 6 (six) hours as needed.     albuterol (VENTOLIN HFA) 108 (90 Base) MCG/ACT inhaler Inhale 1-2 puffs into the lungs every 6 (six) hours as needed for wheezing or shortness of breath. 54 g 1   ascorbic acid (VITAMIN C) 500 MG tablet Take 500 mg by mouth 4 (four) times daily.     aspirin EC 81 MG tablet Take 81 mg by mouth daily.     b complex vitamins capsule Take 1 capsule by mouth daily.     bisoprolol-hydrochlorothiazide (ZIAC) 10-6.25 MG tablet TAKE 1 TABLET EVERY DAY FOR BLOOD PRESSURE (Patient taking differently: 0.5 tablets. TAKE 1 TABLET EVERY DAY FOR BLOOD PRESSURE) 90 tablet 3   buPROPion (WELLBUTRIN SR) 150 MG 12 hr tablet Take 1 tablet (150 mg total) by mouth 2 (two) times daily. Take one tab daily for 3 days then increase to twice daily 60 tablet 2   cholecalciferol (VITAMIN D) 1000 UNITS tablet 2,000 Units in the morning, at noon, and at bedtime. 4000 total a day     Fluticasone-Umeclidin-Vilant (TRELEGY ELLIPTA) 100-62.5-25 MCG/ACT AEPB Use  1 Inhalation  Daily  for COPD / Asthma 180 each 3   Magnesium 250 MG TABS Take by mouth every other day.     Multiple Vitamin (MULTIVITAMIN) tablet Take 1 tablet by mouth daily. Takes a vitamin pack daily     Omega-3 Fatty Acids (OMEGA 3 PO) Take by mouth. Takes Omega Red     Pseudoephedrine-guaiFENesin ([place] D PO) Take by mouth.     saw palmetto 500 MG capsule Take 900 mg by mouth daily.     No facility-administered medications prior to visit.     Review of Systems:   Constitutional: No weight loss or gain, night sweats, fevers,  chills, fatigue, or lassitude. HEENT: No headaches, difficulty swallowing, tooth/dental problems, or sore throat. No sneezing, itching, ear ache, nasal congestion, or post nasal drip CV:  No chest pain, orthopnea, PND, swelling in lower extremities, anasarca, dizziness, palpitations, syncope Resp: +shortness of breath with exertion (baseline). No excess mucus or change in color of mucus. No productive or non-productive. No hemoptysis. No wheezing.  No chest wall deformity GI:  No heartburn, indigestion, abdominal pain, nausea, vomiting, diarrhea, change in bowel habits, loss of appetite, bloody stools.  GU: No dysuria, change in color of urine, urgency or frequency.  No flank pain, no hematuria  Skin: No rash, lesions, ulcerations MSK:  No joint pain or swelling.   Neuro: No dizziness or lightheadedness.  Psych: No depression or anxiety. Mood stable.     Physical Exam:  BP 132/74   Pulse 67   Temp 97.8 F (36.6 C) (Oral)   Ht 5' 11"$  (1.803 m)   Wt 172 lb  3.2 oz (78.1 kg)   SpO2 95%   BMI 24.02 kg/m   GEN: Pleasant, interactive, well-appearing; in no acute distress. HEENT:  Normocephalic and atraumatic. PERRLA. Sclera white. Nasal turbinates pink, moist and patent bilaterally. No rhinorrhea present. Oropharynx pink and moist, without exudate or edema. No lesions, ulcerations, or postnasal drip.  NECK:  Supple w/ fair ROM. No JVD present. Normal carotid impulses w/o bruits. Thyroid symmetrical with no goiter or nodules palpated. No lymphadenopathy.   CV: RRR, no m/r/g, no peripheral edema. Pulses intact, +2 bilaterally. No cyanosis, pallor or clubbing. PULMONARY:  Unlabored, regular breathing. Clear bilaterally A&P w/o wheezes/rales/rhonchi. No accessory muscle use.  GI: BS present and normoactive. Soft, non-tender to palpation. No organomegaly or masses detected.  MSK: No erythema, warmth or tenderness. Cap refil <2 sec all extrem. No deformities or joint swelling noted.  Neuro: A/Ox3.  No focal deficits noted.   Skin: Warm, no lesions or rashe Psych: Normal affect and behavior. Judgement and thought content appropriate.     Lab Results:  CBC    Component Value Date/Time   WBC 10.9 (H) 05/18/2022 0000   RBC 4.38 05/18/2022 0000   HGB 14.2 05/18/2022 0000   HCT 41.7 05/18/2022 0000   PLT 271 05/18/2022 0000   MCV 95.2 05/18/2022 0000   MCH 32.4 05/18/2022 0000   MCHC 34.1 05/18/2022 0000   RDW 12.1 05/18/2022 0000   LYMPHSABS 2,093 05/18/2022 0000   MONOABS 768 11/08/2016 0854   EOSABS 120 05/18/2022 0000   BASOSABS 131 05/18/2022 0000    BMET    Component Value Date/Time   NA 142 05/18/2022 0000   K 4.9 05/18/2022 0000   CL 104 05/18/2022 0000   CO2 30 05/18/2022 0000   GLUCOSE 86 05/18/2022 0000   BUN 13 05/18/2022 0000   CREATININE 0.89 05/18/2022 0000   CALCIUM 9.3 05/18/2022 0000   GFRNONAA 80 08/07/2020 1112   GFRAA 93 08/07/2020 1112    BNP No results found for: "BNP"   Imaging:  NM PET Image Initial (PI) Skull Base To Thigh (F-18 FDG)  Result Date: 07/06/2022 CLINICAL DATA:  Initial treatment strategy for left upper lobe pulmonary nodule on lung cancer screening CT. EXAM: NUCLEAR MEDICINE PET SKULL BASE TO THIGH TECHNIQUE: 8.5 mCi F-18 FDG was injected intravenously. Full-ring PET imaging was performed from the skull base to thigh after the radiotracer. CT data was obtained and used for attenuation correction and anatomic localization. Fasting blood glucose: 93 mg/dl COMPARISON:  06/04/2022 lung cancer screening CT. Abdominopelvic CT from 09/09/2011, [facility], also reviewed. FINDINGS: Mediastinal blood pool activity: SUV max 2.3 Liver activity: SUV max NA NECK: 3 small hypermetabolic left parotid nodules. Example 8 mm and a S.U.V. max of 5.6 on 23/4. No cervical nodal hypermetabolism. Incidental CT findings: No cervical adenopathy. Bilateral carotid atherosclerosis. CHEST: No thoracic nodal hypermetabolism. The left upper lobe  pulmonary nodule of interest is hypermetabolic. Example 7 mm and a S.U.V. max of 3.6 on 51/4. Incidental CT findings: Deferred to recent diagnostic CT. ABDOMEN/PELVIS: No abdominopelvic nodal hypermetabolism. Gastric fundal mild hypermetabolism including at a S.U.V. max of 5.3. Right posterolateral (likely peripheral zone) prostatic hypermetabolism at the midgland level measures a S.U.V. max of 5.4. Incidental CT findings: Normal adrenal glands. Abdominal aortic atherosclerosis. Infrarenal abdominal aortic dilatation at 3.2 x 2.9 cm. Prostatomegaly. SKELETON: No abnormal marrow activity. Incidental CT findings: None. IMPRESSION: 1. 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 2. Right-sided prostatic hypermetabolism warrants correlation with PSA and physical exam. 3. 3 left parotid hypermetabolic nodules are suspicious for incidental primary neoplasms. 4. Mild gastric fundal hypermetabolism is nonspecific but could represent gastritis. 5. Incidental findings, including: Aortic atherosclerosis (ICD10-I70.0), coronary artery atherosclerosis and emphysema (ICD10-J43.9). Prostatomegaly. 6. Infrarenal aortic dilatation of 3.2 cm. Recommend follow-up ultrasound every 3 years. This recommendation follows ACR consensus guidelines: White Paper of the ACR Incidental Findings Committee II on Vascular Findings. J Am Coll Radiol 2013; 10:789-794. Electronically Signed   By: Abigail Miyamoto M.D.   On: 07/06/2022 09:12          No data to display          No results found for: "NITRICOXIDE"      Assessment & Plan:   Pulmonary nodules LUL hypermetabolic on PET imaging and concerning for primary bronchogenic neoplasm. Discussed case with Dr. Valeta Harms. Given he is still actively smoking and we do not have formal PFTs yet, recommended bronchoscopy with biopsy for further evaluation. Reviewed risks/benefits of procedure today. Pt verbalized  understanding. Shared decision to move forward with this. We again reviewed the importance of smoking cessation today.   Patient Instructions  Continue Trelegy 1 puff daily. Brush tongue and rinse mouth afterwards Continue Albuterol inhaler 2 puffs every 6 hours as needed for shortness of breath or wheezing. Notify if symptoms persist despite rescue inhaler/neb use. Continue wellbutrin 1 tablet daily for a total of 3 days then increase to 2 tablets daily. Monitor your mood. If you develop any depressed feelings or anxiety, stop and notify us  Work on quitting smoking  Follow up with your urologist regarding the findings in your prostate on your PET scan and to decide next steps  Referral to Ear, Nose and Throat to evaluate the nodules on your parotid gland  We will get you set up for bronchoscopy on 3/5. Someone will contact you to schedule this  Follow up after PFTs with Dr. Valeta Harms or Alanson Aly on 3/14. If symptoms do not improve or worsen, please contact office for sooner follow up or seek emergency care.    Prostate enlargement Prostatomegaly and hypermetabolism noted on PET scan. He is followed by urology with monitoring of his PSA. I will send them a message to make them aware of these findings and determine next steps. He will also call their office to schedule sooner follow up.  Parotid nodule There are 3 hypermetabolic nodules on his left parotid gland. He denies associated symptoms. Referral to ENT for further evaluation/management.   Tobacco abuse Encouraged him to continue Wellbutrin. Reviewed additional measures to help with smoking cessation  The patient's current tobacco use: 1 ppd The patient was advised to quit and impact of smoking on their health.  I assessed the patient's willingness to attempt to quit. I provided methods and skills for cessation. We reviewed medication management of smoking session drugs if appropriate. Resources to help quit smoking were  provided. A smoking cessation quit date was set: 08/07/2022 Follow-up was arranged in our clinic.  The amount of time spent counseling patient was 4 mins    Chronic obstructive pulmonary disease ([place]) PFTs scheduled today. He is maintained on Trelegy. Clinically stable. Action plan in place.   Infrarenal abdominal aortic aneurysm (AAA) without rupture ([place]) Incidental finding. Will route to his PCP for recommended monitoring every 3 years.   Abnormal gastrointestinal PET scan Hypermetabolism of gastric fundus. No symptoms of gastritis or GI concerns/complaints. Will refer to  GI for further evaluation/recommendations.    I spent 38 minutes of dedicated to the care of this patient on the date of this encounter to include pre-visit review of records, face-to-face time with the patient discussing conditions above, post visit ordering of testing, clinical documentation with the electronic health record, making appropriate referrals as documented, and communicating necessary findings to members of the patients care team.  Clayton Bibles, NP 07/09/2022  Pt aware and understands NP's role.

## 2022-07-09 NOTE — Patient Instructions (Addendum)
Continue Trelegy 1 puff daily. Brush tongue and rinse mouth afterwards Continue Albuterol inhaler 2 puffs every 6 hours as needed for shortness of breath or wheezing. Notify if symptoms persist despite rescue inhaler/neb use. Continue wellbutrin 1 tablet daily for a total of 3 days then increase to 2 tablets daily. Monitor your mood. If you develop any depressed feelings or anxiety, stop and notify us  Work on quitting smoking  Follow up with your urologist regarding the findings in your prostate on your PET scan and to decide next steps  Referral to Ear, Nose and Throat to evaluate the nodules on your parotid gland  We will get you set up for bronchoscopy on 3/5. Someone will contact you to schedule this  Follow up after PFTs with Dr. Valeta Harms or Alanson Aly on 3/14. If symptoms do not improve or worsen, please contact office for sooner follow up or seek emergency care.

## 2022-07-09 NOTE — Assessment & Plan Note (Signed)
PFTs scheduled today. He is maintained on Trelegy. Clinically stable. Action plan in place.

## 2022-07-09 NOTE — Assessment & Plan Note (Signed)
There are 3 hypermetabolic nodules on his left parotid gland. He denies associated symptoms. Referral to ENT for further evaluation/management.

## 2022-07-09 NOTE — H&P (View-Only) (Signed)
$'@Patient's$  ID: Michael [name], male    DOB: 02-23-1951, 72 y.o.   MRN: [id number]  Chief Complaint  Patient presents with   Follow-up    Pet review     Referring provider: Unk Pinto, MD  HPI: 72 year old male, active smoker followed for nodule of left upper lobe and severe emphysema. He is a patient of Dr. Juline Patch and last seen in office 06/24/2022. Past medical history significant for HTN, CAD, BPH, elevated PSA, vitamin d deficiency, HLD.   TEST/EVENTS:  06/04/2022 LDCT chest lung cancer screening: Atherosclerosis.  Multiple densely calcified mediastinal and hilar lymph nodes.  Innumerable small calcified and noncalcified pulmonary nodules scattered throughout the lungs bilaterally.  There is a new left upper lobe new nodule with diameter of 8.4 mm.  This has macrolobulated and slightly spiculated margins, concerning for small neoplasm.  Diffuse bronchial wall thickening with moderate centrilobular and paraseptal emphysema.  Extensive bilateral apical pleural-parenchymal thickening and nodular architectural distortion, similar to prior studies and most compatible with areas of chronic postinfectious or inflammatory scarring. 07/05/2022 PET scan: 3 small hypermetabolic left parotid nodules.  Left upper lobe pulmonary nodule of interest is hypermetabolic.  Gastric fundal mild hypermetabolism with SUV max of 5.3.  Right prostatic hypermetabolism.  Atherosclerosis.  Infrarenal aortic dilatation of 3.2 cm.  Recommend follow-up ultrasound every 3 years.  Atherosclerosis, emphysema and prostatomegaly are incidental findings.  06/24/2022: OV with Dr. Valeta Harms for initial consult.  Abnormal lung cancer screening CT completed on 06/04/2022.  Found to have innumerable small calcified and noncalcified pulmonary nodules that were again noted.  There is a new nodule in the left upper lobe, measuring 8.4 mm, which led to concern for small neoplasm.  Has bilateral pleural parenchymal thickening and evidence of  centrilobular and paraseptal emphysema.  Has a significant smoking history.  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  Currently managed with Trelegy.  Patient was set up for PET scan.  May be a candidate for surgical resection in the future; however, he is still smoking significantly.  Talked about smoking cessation options.  Started on Wellbutrin for this.  Ordered formal pulmonary function testing.  07/09/2022: Today-follow-up Patient presents today for follow-up to discuss PET scan results.  The left upper lobe nodule of concern was noted to be hypermetabolic.  He was also found to have 3 small hypermetabolic left parotid nodules and hypermetabolism of the prostate.  He also had some mild gastric fundal hypermetabolism which was nonspecific.  Today, he tells me that he feels relatively unchanged compared to when he was here last.  He denies any hemoptysis, fevers, night sweats, weight loss.  He does not have any difficulties with swallowing or swelling in his neck.  He is followed by urology for elevated PSA and BPH.  His last check in October 2023 was 6.6 which was decreased from prior levels.  He has plans to see them again in April.  He is still smoking about a pack a day.  He started the Wellbutrin yesterday.  Allergies  Allergen Reactions   Ace Inhibitors Cough   Ciprofloxacin    Fenofibrate     dizzy    Immunization History  Administered Date(s) Administered   PPD Test 12/25/2013   Pneumococcal Conjugate-13 02/28/2018   Pneumococcal-Unspecified 12/08/2010   Td 08/07/2021   Tdap 12/08/2010    Past Medical History:  Diagnosis Date   BPH (benign prostatic hyperplasia)    COPD (chronic obstructive pulmonary disease) ([place])  via CT lung   GERD (gastroesophageal reflux disease)    Hyperlipidemia    Hypertension    Hypogonadism male    Myocardial infarction [facility]) 2005   Per patient no stent or PTCA, just medical treatment   Prediabetes    Vitamin D deficiency      Tobacco History: Social History   Tobacco Use  Smoking Status Every Day   Packs/day: 1.00   Years: 45.00   Total pack years: 45.00   Types: Cigarettes   Start date: 1965  Smokeless Tobacco Never   Ready to quit: Not Answered Counseling given: Not Answered   Outpatient Medications Prior to Visit  Medication Sig Dispense Refill   acetaminophen (TYLENOL) 500 MG tablet Take 500 mg by mouth every 6 (six) hours as needed.     albuterol (VENTOLIN HFA) 108 (90 Base) MCG/ACT inhaler Inhale 1-2 puffs into the lungs every 6 (six) hours as needed for wheezing or shortness of breath. 54 g 1   ascorbic acid (VITAMIN C) 500 MG tablet Take 500 mg by mouth 4 (four) times daily.     aspirin EC 81 MG tablet Take 81 mg by mouth daily.     b complex vitamins capsule Take 1 capsule by mouth daily.     bisoprolol-hydrochlorothiazide (ZIAC) 10-6.25 MG tablet TAKE 1 TABLET EVERY DAY FOR BLOOD PRESSURE (Patient taking differently: 0.5 tablets. TAKE 1 TABLET EVERY DAY FOR BLOOD PRESSURE) 90 tablet 3   buPROPion (WELLBUTRIN SR) 150 MG 12 hr tablet Take 1 tablet (150 mg total) by mouth 2 (two) times daily. Take one tab daily for 3 days then increase to twice daily 60 tablet 2   cholecalciferol (VITAMIN D) 1000 UNITS tablet 2,000 Units in the morning, at noon, and at bedtime. 4000 total a day     Fluticasone-Umeclidin-Vilant (TRELEGY ELLIPTA) 100-62.5-25 MCG/ACT AEPB Use  1 Inhalation  Daily  for COPD / Asthma 180 each 3   Magnesium 250 MG TABS Take by mouth every other day.     Multiple Vitamin (MULTIVITAMIN) tablet Take 1 tablet by mouth daily. Takes a vitamin pack daily     Omega-3 Fatty Acids (OMEGA 3 PO) Take by mouth. Takes Omega Red     Pseudoephedrine-guaiFENesin ([place] D PO) Take by mouth.     saw palmetto 500 MG capsule Take 900 mg by mouth daily.     No facility-administered medications prior to visit.     Review of Systems:   Constitutional: No weight loss or gain, night sweats, fevers,  chills, fatigue, or lassitude. HEENT: No headaches, difficulty swallowing, tooth/dental problems, or sore throat. No sneezing, itching, ear ache, nasal congestion, or post nasal drip CV:  No chest pain, orthopnea, PND, swelling in lower extremities, anasarca, dizziness, palpitations, syncope Resp: +shortness of breath with exertion (baseline). No excess mucus or change in color of mucus. No productive or non-productive. No hemoptysis. No wheezing.  No chest wall deformity GI:  No heartburn, indigestion, abdominal pain, nausea, vomiting, diarrhea, change in bowel habits, loss of appetite, bloody stools.  GU: No dysuria, change in color of urine, urgency or frequency.  No flank pain, no hematuria  Skin: No rash, lesions, ulcerations MSK:  No joint pain or swelling.   Neuro: No dizziness or lightheadedness.  Psych: No depression or anxiety. Mood stable.     Physical Exam:  BP 132/74   Pulse 67   Temp 97.8 F (36.6 C) (Oral)   Ht '5\' 11"'$  (1.803 m)   Wt 172 lb  3.2 oz (78.1 kg)   SpO2 95%   BMI 24.02 kg/m   GEN: Pleasant, interactive, well-appearing; in no acute distress. HEENT:  Normocephalic and atraumatic. PERRLA. Sclera white. Nasal turbinates pink, moist and patent bilaterally. No rhinorrhea present. Oropharynx pink and moist, without exudate or edema. No lesions, ulcerations, or postnasal drip.  NECK:  Supple w/ fair ROM. No JVD present. Normal carotid impulses w/o bruits. Thyroid symmetrical with no goiter or nodules palpated. No lymphadenopathy.   CV: RRR, no m/r/g, no peripheral edema. Pulses intact, +2 bilaterally. No cyanosis, pallor or clubbing. PULMONARY:  Unlabored, regular breathing. Clear bilaterally A&P w/o wheezes/rales/rhonchi. No accessory muscle use.  GI: BS present and normoactive. Soft, non-tender to palpation. No organomegaly or masses detected.  MSK: No erythema, warmth or tenderness. Cap refil <2 sec all extrem. No deformities or joint swelling noted.  Neuro: A/Ox3.  No focal deficits noted.   Skin: Warm, no lesions or rashe Psych: Normal affect and behavior. Judgement and thought content appropriate.     Lab Results:  CBC    Component Value Date/Time   WBC 10.9 (H) 05/18/2022 0000   RBC 4.38 05/18/2022 0000   HGB 14.2 05/18/2022 0000   HCT 41.7 05/18/2022 0000   PLT 271 05/18/2022 0000   MCV 95.2 05/18/2022 0000   MCH 32.4 05/18/2022 0000   MCHC 34.1 05/18/2022 0000   RDW 12.1 05/18/2022 0000   LYMPHSABS 2,093 05/18/2022 0000   MONOABS 768 11/08/2016 0854   EOSABS 120 05/18/2022 0000   BASOSABS 131 05/18/2022 0000    BMET    Component Value Date/Time   NA 142 05/18/2022 0000   K 4.9 05/18/2022 0000   CL 104 05/18/2022 0000   CO2 30 05/18/2022 0000   GLUCOSE 86 05/18/2022 0000   BUN 13 05/18/2022 0000   CREATININE 0.89 05/18/2022 0000   CALCIUM 9.3 05/18/2022 0000   GFRNONAA 80 08/07/2020 1112   GFRAA 93 08/07/2020 1112    BNP No results found for: "BNP"   Imaging:  NM PET Image Initial (PI) Skull Base To Thigh (F-18 FDG)  Result Date: 07/06/2022 CLINICAL DATA:  Initial treatment strategy for left upper lobe pulmonary nodule on lung cancer screening CT. EXAM: NUCLEAR MEDICINE PET SKULL BASE TO THIGH TECHNIQUE: 8.5 mCi F-18 FDG was injected intravenously. Full-ring PET imaging was performed from the skull base to thigh after the radiotracer. CT data was obtained and used for attenuation correction and anatomic localization. Fasting blood glucose: 93 mg/dl COMPARISON:  06/04/2022 lung cancer screening CT. Abdominopelvic CT from 09/09/2011, [facility], also reviewed. FINDINGS: Mediastinal blood pool activity: SUV max 2.3 Liver activity: SUV max NA NECK: 3 small hypermetabolic left parotid nodules. Example 8 mm and a S.U.V. max of 5.6 on 23/4. No cervical nodal hypermetabolism. Incidental CT findings: No cervical adenopathy. Bilateral carotid atherosclerosis. CHEST: No thoracic nodal hypermetabolism. The left upper lobe  pulmonary nodule of interest is hypermetabolic. Example 7 mm and a S.U.V. max of 3.6 on 51/4. Incidental CT findings: Deferred to recent diagnostic CT. ABDOMEN/PELVIS: No abdominopelvic nodal hypermetabolism. Gastric fundal mild hypermetabolism including at a S.U.V. max of 5.3. Right posterolateral (likely peripheral zone) prostatic hypermetabolism at the midgland level measures a S.U.V. max of 5.4. Incidental CT findings: Normal adrenal glands. Abdominal aortic atherosclerosis. Infrarenal abdominal aortic dilatation at 3.2 x 2.9 cm. Prostatomegaly. SKELETON: No abnormal marrow activity. Incidental CT findings: None. IMPRESSION: 1. 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 2. Right-sided prostatic hypermetabolism warrants correlation with PSA and physical exam. 3. 3 left parotid hypermetabolic nodules are suspicious for incidental primary neoplasms. 4. Mild gastric fundal hypermetabolism is nonspecific but could represent gastritis. 5. Incidental findings, including: Aortic atherosclerosis (ICD10-I70.0), coronary artery atherosclerosis and emphysema (ICD10-J43.9). Prostatomegaly. 6. Infrarenal aortic dilatation of 3.2 cm. Recommend follow-up ultrasound every 3 years. This recommendation follows ACR consensus guidelines: White Paper of the ACR Incidental Findings Committee II on Vascular Findings. J Am Coll Radiol 2013; 10:789-794. Electronically Signed   By: Abigail Miyamoto M.D.   On: 07/06/2022 09:12          No data to display          No results found for: "NITRICOXIDE"      Assessment & Plan:   Pulmonary nodules LUL hypermetabolic on PET imaging and concerning for primary bronchogenic neoplasm. Discussed case with Dr. Valeta Harms. Given he is still actively smoking and we do not have formal PFTs yet, recommended bronchoscopy with biopsy for further evaluation. Reviewed risks/benefits of procedure today. Pt verbalized  understanding. Shared decision to move forward with this. We again reviewed the importance of smoking cessation today.   Patient Instructions  Continue Trelegy 1 puff daily. Brush tongue and rinse mouth afterwards Continue Albuterol inhaler 2 puffs every 6 hours as needed for shortness of breath or wheezing. Notify if symptoms persist despite rescue inhaler/neb use. Continue wellbutrin 1 tablet daily for a total of 3 days then increase to 2 tablets daily. Monitor your mood. If you develop any depressed feelings or anxiety, stop and notify us  Work on quitting smoking  Follow up with your urologist regarding the findings in your prostate on your PET scan and to decide next steps  Referral to Ear, Nose and Throat to evaluate the nodules on your parotid gland  We will get you set up for bronchoscopy on 3/5. Someone will contact you to schedule this  Follow up after PFTs with Dr. Valeta Harms or Alanson Aly on 3/14. If symptoms do not improve or worsen, please contact office for sooner follow up or seek emergency care.    Prostate enlargement Prostatomegaly and hypermetabolism noted on PET scan. He is followed by urology with monitoring of his PSA. I will send them a message to make them aware of these findings and determine next steps. He will also call their office to schedule sooner follow up.  Parotid nodule There are 3 hypermetabolic nodules on his left parotid gland. He denies associated symptoms. Referral to ENT for further evaluation/management.   Tobacco abuse Encouraged him to continue Wellbutrin. Reviewed additional measures to help with smoking cessation  The patient's current tobacco use: 1 ppd The patient was advised to quit and impact of smoking on their health.  I assessed the patient's willingness to attempt to quit. I provided methods and skills for cessation. We reviewed medication management of smoking session drugs if appropriate. Resources to help quit smoking were  provided. A smoking cessation quit date was set: 08/07/2022 Follow-up was arranged in our clinic.  The amount of time spent counseling patient was 4 mins    Chronic obstructive pulmonary disease ([place]) PFTs scheduled today. He is maintained on Trelegy. Clinically stable. Action plan in place.   Infrarenal abdominal aortic aneurysm (AAA) without rupture ([place]) Incidental finding. Will route to his PCP for recommended monitoring every 3 years.   Abnormal gastrointestinal PET scan Hypermetabolism of gastric fundus. No symptoms of gastritis or GI concerns/complaints. Will refer to  GI for further evaluation/recommendations.    I spent 38 minutes of dedicated to the care of this patient on the date of this encounter to include pre-visit review of records, face-to-face time with the patient discussing conditions above, post visit ordering of testing, clinical documentation with the electronic health record, making appropriate referrals as documented, and communicating necessary findings to members of the patients care team.  Clayton Bibles, NP 07/09/2022  Pt aware and understands NP's role.

## 2022-07-09 NOTE — Assessment & Plan Note (Signed)
LUL hypermetabolic on PET imaging and concerning for primary bronchogenic neoplasm. Discussed case with Dr. Valeta Harms. Given he is still actively smoking and we do not have formal PFTs yet, recommended bronchoscopy with biopsy for further evaluation. Reviewed risks/benefits of procedure today. Pt verbalized understanding. Shared decision to move forward with this. We again reviewed the importance of smoking cessation today.   Patient Instructions  Continue Trelegy 1 puff daily. Brush tongue and rinse mouth afterwards Continue Albuterol inhaler 2 puffs every 6 hours as needed for shortness of breath or wheezing. Notify if symptoms persist despite rescue inhaler/neb use. Continue wellbutrin 1 tablet daily for a total of 3 days then increase to 2 tablets daily. Monitor your mood. If you develop any depressed feelings or anxiety, stop and notify us  Work on quitting smoking  Follow up with your urologist regarding the findings in your prostate on your PET scan and to decide next steps  Referral to Ear, Nose and Throat to evaluate the nodules on your parotid gland  We will get you set up for bronchoscopy on 3/5. Someone will contact you to schedule this  Follow up after PFTs with Dr. Valeta Harms or Alanson Aly on 3/14. If symptoms do not improve or worsen, please contact office for sooner follow up or seek emergency care.

## 2022-07-09 NOTE — Assessment & Plan Note (Signed)
Encouraged him to continue Wellbutrin. Reviewed additional measures to help with smoking cessation  The patient's current tobacco use: 1 ppd The patient was advised to quit and impact of smoking on their health.  I assessed the patient's willingness to attempt to quit. I provided methods and skills for cessation. We reviewed medication management of smoking session drugs if appropriate. Resources to help quit smoking were provided. A smoking cessation quit date was set: 08/07/2022 Follow-up was arranged in our clinic.  The amount of time spent counseling patient was 4 mins

## 2022-07-09 NOTE — Assessment & Plan Note (Signed)
Hypermetabolism of gastric fundus. No symptoms of gastritis or GI concerns/complaints. Will refer to GI for further evaluation/recommendations.

## 2022-07-12 NOTE — Telephone Encounter (Signed)
Scheduled for 3/5 at 2:30.  Pt lives in [place].  Will need to come to [place] on 3/1 or arrive 3 hrs early on 3/5.  Left vm for pt to call me for appt info.

## 2022-07-12 NOTE — Telephone Encounter (Signed)
If I am to call to schedule this I will need info on template so I will know what to tell schedulers.

## 2022-07-12 NOTE — Telephone Encounter (Signed)
[place] I will get scheduled.

## 2022-07-13 ENCOUNTER — Encounter: Payer: Self-pay | Admitting: Pulmonary Disease

## 2022-07-13 NOTE — Telephone Encounter (Signed)
Spoke to pt & gave him appt info.  I didn't have template so didn't know about med restrictions.  I asked if he was on blood thinners and he states he takes a baby aspirin each day.  Do I need to get him to hold the aspirin?

## 2022-07-13 NOTE — Telephone Encounter (Signed)
I spoke to pt & made him aware he can continue the aspirin.  Mailed bronch letter.  Nothing further needed.

## 2022-07-23 ENCOUNTER — Encounter (HOSPITAL_COMMUNITY): Payer: Self-pay | Admitting: Pulmonary Disease

## 2022-07-23 NOTE — Progress Notes (Signed)
Anesthesia Chart Review: SAME DAY WORK-UP  Case: [id number] Date/Time: 07/27/22 1400   Procedures:      ROBOTIC ASSISTED NAVIGATIONAL BRONCHOSCOPY     VIDEO BRONCHOSCOPY WITH ENDOBRONCHIAL ULTRASOUND (Left)   Anesthesia type: General   Diagnosis: Pulmonary nodules [R91.8]   Pre-op diagnosis: hypermetabolic LUL lung nodule   Location: [facility] 3 / [place] [facility]   Surgeons: Garner Nash, DO       DISCUSSION: Patient is a 72 year old male scheduled for the above procedure.  He has a hypermetabolic LUL lung nodule concerning for oncogenic neoplasm.  Bronchoscopy for tissue diagnosis recommended.  History includes smoking, COPD, HTN, HLD, CAD, prediabetes, BPH, GERD, amputation left digit (1991). 07/05/22 PET scan showed 3.2 cm AAA with 3 year follow-up recommended.   He presented with chest pain 05/14/04 and LHC showed total occlusion of a very small small second diagonal branch of the left anterior descending with normal normal circumflex and normal right coronary arteries. Medical therapy was advised. He did not have cardiology follow-up until 09/03/16 when he saw 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 He had a non-ischemic stress test, EF 59% on 09/07/16. He otherwise is followed by his PCP Unk Pinto, MD, last visit 05/18/22 for follow-up of chronic medical conditions. He noted prior MI in 2005. Patient denied chest pain, palpitations, dyspnea, dizziness, and edema. HTN controlled. HLD was not at goal with diet and ezetimibe and had been intolerant to statins.  Smoking cessation encouraged.  EKG showed NSR.   Pulmonology referred to GI 07/09/22 for hypermetabolism of gastric fundus and to ENT for 3 hypermetabolic nodules on his left parotid gland on 07/05/22 PET scan. Also prostamegaly and hypermetabolism noted but already followed by urology with PSA monitoring.  He lives in [place], [place].   Reviewed with anesthesiologist Adele Barthel, MD.  Patient has a suspicious lung lesion and need diagnosis to guide management. His CAD is currently followed by his PCP Dr. Melford Aase. He was asymptomatic at recent follow-up. Will attempt to confirm with patient prior to surgery, otherwise anesthesia team to evaluate on the day of surgery.  ADDENDUM 07/26/22 12:35 PM: As above, chart reviewed with Dr. Roanna Banning. I called and spoke with Mr. Hyppolite. He denied any cardiac symptoms. When he had LHC in 2005 showing occlusion of a very small D2, he was having pain in his left axillary region and felt listless. He has not had any recurrent symptoms and does not have to use Nitro. He says he is able to do yard work and still works part-time as a Engineer, civil (consulting). These activities require from lifting and pulling as well. He confirmed last cardiology visit was in 2018 with 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 Melford Aase follow his CAD currently. He reports he is active without CV symptoms. Last EKG NSR. Needs tissue diagnosis to guide management of LUL lung nodule. Anesthesia team to evaluate on the day of surgery.    VS: Ht '5\' 11"'$  (1.803 m)   Wt 78.2 kg   BMI 24.03 kg/m  BP Readings from Last 3 Encounters:  07/09/22 132/74  06/24/22 (!) 140/80  05/18/22 136/66   Pulse Readings from Last 3 Encounters:  07/09/22 67  06/24/22 69  05/18/22 70     PROVIDERS: Unk Pinto, MD is PCP  Shelva Majestic, MD is cardiologist Michaelle Birks, MD is urologist    LABS: Most recent lab results in [facility] include: Lab Results  Component  Value Date   WBC 10.9 (H) 05/18/2022   HGB 14.2 05/18/2022   HCT 41.7 05/18/2022   PLT 271 05/18/2022   GLUCOSE 86 05/18/2022   CHOL 252 (H) 05/18/2022   TRIG 242 (H) 05/18/2022   HDL 55 05/18/2022   LDLCALC 156 (H) 05/18/2022   ALT 15 05/18/2022   AST 18 05/18/2022   NA 142 05/18/2022   K 4.9 05/18/2022   CL 104 05/18/2022   CREATININE 0.89 05/18/2022   BUN 13 05/18/2022   CO2 30 05/18/2022   TSH  1.51 05/18/2022   HGBA1C 5.9 (H) 05/18/2022   MICROALBUR <0.2 05/18/2022     IMAGES: PET Scan 07/05/22: IMPRESSION: 1. 7 mm hypermetabolic left upper lobe pulmonary nodule is most consistent with stage I A primary bronchogenic carcinoma. 2. Right-sided prostatic hypermetabolism warrants correlation with PSA and physical exam. 3. 3 left parotid hypermetabolic nodules are suspicious for incidental primary neoplasms. 4. Mild gastric fundal hypermetabolism is nonspecific but could represent gastritis. 5. Incidental findings, including: Aortic atherosclerosis (ICD10-I70.0), coronary artery atherosclerosis and emphysema (ICD10-J43.9). Prostatomegaly. 6. Infrarenal aortic dilatation of 3.2 cm. Recommend follow-up ultrasound every 3 years. This recommendation follows ACR consensus guidelines: White Paper of the ACR Incidental Findings Committee II on Vascular Findings. J Am Coll Radiol 2013; 10:789-794.   CT Chest LCS 06/04/22: IMPRESSION: 1. Interval development of a aggressive appearing left upper lobe pulmonary nodule categorized as Lung-RADS 4BS, suspicious. Additional imaging evaluation or consultation with Pulmonology or Thoracic Surgery recommended. 2. The "S" modifier above refers to potentially clinically significant non lung cancer related findings. Specifically, there is aortic atherosclerosis, in addition to three-vessel coronary artery disease. Please note that although the presence of coronary artery calcium documents the presence of coronary artery disease, the severity of this disease and any potential stenosis cannot be assessed on this non-gated CT examination. Assessment for potential risk factor modification, dietary therapy or pharmacologic therapy may be warranted, if clinically indicated. 3. Mild diffuse bronchial wall thickening with moderate centrilobular and paraseptal emphysema; imaging findings suggestive of underlying COPD.     EKG: 05/18/22:  NSR   CV: US Carotid 10/29/21: Summary:  - Right Carotid: Velocities in the right ICA are consistent with a 1-39% stenosis. Non-hemodynamically significant plaque <50% noted in the CCA. The ECA appears <50% stenosed.  - Left Carotid: Velocities in the left ICA are consistent with a 40-59% stenosis. Non-hemodynamically significant plaque <50% noted in the CCA. The ECA appears <50% stenosed.  - Vertebrals:  Bilateral vertebral arteries demonstrate antegrade flow.  - Subclavians: Normal flow hemodynamics were seen in bilateral subclavian arteries.    Nuclear stress test 09/07/16: The left ventricular ejection fraction is normal (55-65%). Nuclear stress EF: 59%. No wall motion abnormalities. There was no ST segment deviation noted during stress. This is a low risk study. No ischemic perfusion changes.   Cardiac cath 05/14/04: IMPRESSION: 1.  Normal global left ventricular function with subtle focal, mild upper mid anterolateral hypocontractility. LFEF 55-60%.  2.  Total occlusion of a very small second diagonal branch of the left anterior descending. 3.  Normal circumflex and normal right coronary arteries. RECOMMENDATIONS:  Mr. Sledz yesterday developed classic symptoms of accelerated new onset angina pectoris/unstable angina.  Catheterization today shows total occlusion of a very small second diagonal branch of his LAD which is 1.5 to less than 2 mm in size.  There appeared to be very faint filling of a very small mid portion of the diagonal vessel.  Medical therapy will be  recommended.  Smoking cessation will be strongly encouraged.  The patient will be started on antiplatelet therapy in addition to aggressive lipid lowering, beta blockade, and probable ACE inhibition.   Past Medical History:  Diagnosis Date   Arthritis    right wrist   BPH (benign prostatic hyperplasia)    COPD (chronic obstructive pulmonary disease) (HCC)    via CT lung   Dyspnea    GERD (gastroesophageal reflux  disease)    yrs ago, no current problems, no meds as of 07/23/22   Hyperlipidemia    Hypertension    Hypogonadism male    Myocardial infarction [facility]) 2005   Per patient no stent or PTCA, just medical treatment   Pneumonia    x 1   Prediabetes    Vitamin D deficiency     Past Surgical History:  Procedure Laterality Date   AMPUTATION FINGER / THUMB Left 1991   distal index   CARDIAC CATHETERIZATION N/A 2005   Per patient no stent or PTCA, just medical treatment   CATARACT EXTRACTION, BILATERAL Bilateral 2014   Dr. Talbert Forest    colonoscopy N/A 10/2012   Neg and recc f/u Cololnoscopy in 5 yeary - Dr Nicki Reaper [place] - Gastroenterologist   PROSTATE SURGERY     Biopsy X 3   VASECTOMY      MEDICATIONS: No current facility-administered medications for this encounter.    acetaminophen (TYLENOL) 500 MG tablet   albuterol (VENTOLIN HFA) 108 (90 Base) MCG/ACT inhaler   ascorbic acid (VITAMIN C) 500 MG tablet   aspirin EC 81 MG tablet   B Complex-C (SUPER B COMPLEX PO)   bisoprolol-hydrochlorothiazide (ZIAC) 10-6.25 MG tablet   buPROPion (WELLBUTRIN SR) 150 MG 12 hr tablet   Cholecalciferol (VITAMIN D) 50 MCG (2000 UT) CAPS   Fluticasone-Umeclidin-Vilant (TRELEGY ELLIPTA) 100-62.5-25 MCG/ACT AEPB   Guaifenesin 1200 MG TB12   Magnesium 250 MG TABS   Multiple Vitamin (MULTIVITAMIN) tablet   Omega-3 Fatty Acids (OMEGA 3 PO)   saw palmetto 500 MG capsule    Myra Gianotti, PA-C Surgical Short Stay/Anesthesiology [facility] Phone [phone] [facility] Phone [phone] 07/23/2022 5:47 PM

## 2022-07-23 NOTE — Progress Notes (Addendum)
PCP - Dr Unk Pinto  Cardiologist - n/a  Chest x-ray - 06/04/22 EKG - 05/18/22 Stress Test - 09/07/16 ECHO - n/a Cardiac Cath - 2005  ICD Pacemaker/Loop - n/a  Sleep Study -  n/a CPAP - none  Diabetes - n/a  Aspirin Instructions: Follow your surgeon's instructions on when to stop aspirin prior to surgery,  If no instructions were given by your surgeon then you will need to call the office for those instructions.  NPO  Anesthesia review: Yes  STOP now taking any Aspirin (unless otherwise instructed by your surgeon), Aleve, Naproxen, Ibuprofen, Motrin, Advil, Goody's, BC's, all herbal medications, fish oil, and all vitamins.   Coronavirus Screening Covid test on DOS. Do you have any of the following symptoms:  Cough yes/no: No Fever (>100.65F)  yes/no: No Runny nose yes/no: No Sore throat yes/no: No Difficulty breathing/shortness of breath  yes/no: Yes  Have you traveled in the last 14 days and where? yes/no: No  Patient verbalized understanding of instructions that were given via phone.

## 2022-07-26 ENCOUNTER — Ambulatory Visit: Payer: Medicare PPO | Admitting: Pulmonary Disease

## 2022-07-26 NOTE — Anesthesia Preprocedure Evaluation (Signed)
Anesthesia Evaluation  Patient identified by MRN, date of birth, ID band Patient awake    Reviewed: Allergy & Precautions, NPO status , Patient's Chart, lab work & pertinent test results, reviewed documented beta blocker date and time   Airway Mallampati: II  TM Distance: >3 FB Neck ROM: Full    Dental  (+) Dental Advisory Given, Edentulous Upper   Pulmonary COPD,  COPD inhaler, Current SmokerPatient did not abstain from smoking. hypermetabolic LUL lung nodule    Pulmonary exam normal breath sounds clear to auscultation       Cardiovascular hypertension, Pt. on home beta blockers and Pt. on medications + CAD and + Past MI  Normal cardiovascular exam Rhythm:Regular Rate:Normal     Neuro/Psych negative neurological ROS  negative psych ROS   GI/Hepatic negative GI ROS, Neg liver ROS,,,  Endo/Other  negative endocrine ROS    Renal/GU negative Renal ROS     Musculoskeletal  (+) Arthritis ,    Abdominal   Peds  Hematology negative hematology ROS (+)   Anesthesia Other Findings Day of surgery medications reviewed with the patient.  Reproductive/Obstetrics                             Anesthesia Physical Anesthesia Plan  ASA: 3  Anesthesia Plan: General   Post-op Pain Management: Tylenol PO (pre-op)*   Induction: Intravenous  PONV Risk Score and Plan: 1 and Dexamethasone and Ondansetron  Airway Management Planned: Oral ETT  Additional Equipment:   Intra-op Plan:   Post-operative Plan: Extubation in OR  Informed Consent: I have reviewed the patients History and Physical, chart, labs and discussed the procedure including the risks, benefits and alternatives for the proposed anesthesia with the patient or authorized representative who has indicated his/her understanding and acceptance.     Dental advisory given  Plan Discussed with: CRNA  Anesthesia Plan Comments: (PAT note  written by Myra Gianotti, PA-C.  )       Anesthesia Quick Evaluation

## 2022-07-27 ENCOUNTER — Other Ambulatory Visit: Payer: Self-pay

## 2022-07-27 ENCOUNTER — Encounter (HOSPITAL_COMMUNITY): Payer: Self-pay | Admitting: Pulmonary Disease

## 2022-07-27 ENCOUNTER — Ambulatory Visit (HOSPITAL_COMMUNITY): Payer: Medicare PPO | Admitting: Vascular Surgery

## 2022-07-27 ENCOUNTER — Ambulatory Visit (HOSPITAL_COMMUNITY)
Admission: RE | Admit: 2022-07-27 | Discharge: 2022-07-27 | Disposition: A | Discharge status: Home / Self Care | Payer: Medicare PPO | Attending: Pulmonary Disease | Admitting: Pulmonary Disease

## 2022-07-27 ENCOUNTER — Ambulatory Visit (HOSPITAL_COMMUNITY): Payer: Medicare PPO

## 2022-07-27 ENCOUNTER — Encounter (HOSPITAL_COMMUNITY)
Admission: RE | Disposition: A | Discharge status: Home / Self Care | Payer: Self-pay | Source: Home / Self Care | Attending: Pulmonary Disease

## 2022-07-27 ENCOUNTER — Ambulatory Visit (HOSPITAL_BASED_OUTPATIENT_CLINIC_OR_DEPARTMENT_OTHER): Payer: Medicare PPO | Admitting: Vascular Surgery

## 2022-07-27 ENCOUNTER — Telehealth: Payer: Self-pay

## 2022-07-27 DIAGNOSIS — J189 Pneumonia, unspecified organism: Secondary | ICD-10-CM | POA: Diagnosis not present

## 2022-07-27 DIAGNOSIS — I10 Essential (primary) hypertension: Secondary | ICD-10-CM

## 2022-07-27 DIAGNOSIS — F17210 Nicotine dependence, cigarettes, uncomplicated: Secondary | ICD-10-CM

## 2022-07-27 DIAGNOSIS — I252 Old myocardial infarction: Secondary | ICD-10-CM | POA: Diagnosis not present

## 2022-07-27 DIAGNOSIS — R948 Abnormal results of function studies of other organs and systems: Secondary | ICD-10-CM

## 2022-07-27 DIAGNOSIS — Z79899 Other long term (current) drug therapy: Secondary | ICD-10-CM | POA: Insufficient documentation

## 2022-07-27 DIAGNOSIS — M1990 Unspecified osteoarthritis, unspecified site: Secondary | ICD-10-CM | POA: Insufficient documentation

## 2022-07-27 DIAGNOSIS — E785 Hyperlipidemia, unspecified: Secondary | ICD-10-CM | POA: Diagnosis not present

## 2022-07-27 DIAGNOSIS — I2510 Atherosclerotic heart disease of native coronary artery without angina pectoris: Secondary | ICD-10-CM | POA: Diagnosis not present

## 2022-07-27 DIAGNOSIS — Z1152 Encounter for screening for COVID-19: Secondary | ICD-10-CM | POA: Insufficient documentation

## 2022-07-27 DIAGNOSIS — J449 Chronic obstructive pulmonary disease, unspecified: Secondary | ICD-10-CM | POA: Insufficient documentation

## 2022-07-27 DIAGNOSIS — J8410 Pulmonary fibrosis, unspecified: Secondary | ICD-10-CM | POA: Diagnosis not present

## 2022-07-27 DIAGNOSIS — R918 Other nonspecific abnormal finding of lung field: Secondary | ICD-10-CM | POA: Diagnosis not present

## 2022-07-27 DIAGNOSIS — F17200 Nicotine dependence, unspecified, uncomplicated: Secondary | ICD-10-CM | POA: Insufficient documentation

## 2022-07-27 DIAGNOSIS — R911 Solitary pulmonary nodule: Secondary | ICD-10-CM | POA: Insufficient documentation

## 2022-07-27 DIAGNOSIS — K219 Gastro-esophageal reflux disease without esophagitis: Secondary | ICD-10-CM | POA: Insufficient documentation

## 2022-07-27 DIAGNOSIS — J984 Other disorders of lung: Secondary | ICD-10-CM | POA: Diagnosis not present

## 2022-07-27 HISTORY — PX: BRONCHIAL BRUSHINGS: SHX5108

## 2022-07-27 HISTORY — PX: BRONCHIAL NEEDLE ASPIRATION BIOPSY: SHX5106

## 2022-07-27 HISTORY — PX: VIDEO BRONCHOSCOPY WITH ENDOBRONCHIAL ULTRASOUND: SHX6177

## 2022-07-27 HISTORY — DX: Pneumonia, unspecified organism: J18.9

## 2022-07-27 HISTORY — DX: Dyspnea, unspecified: R06.00

## 2022-07-27 HISTORY — DX: Unspecified osteoarthritis, unspecified site: M19.90

## 2022-07-27 HISTORY — PX: BRONCHIAL BIOPSY: SHX5109

## 2022-07-27 HISTORY — PX: FIDUCIAL MARKER PLACEMENT: SHX6858

## 2022-07-27 LAB — BASIC METABOLIC PANEL WITH GFR
Anion gap: 9 (ref 5–15)
BUN: 14 mg/dL (ref 8–23)
CO2: 24 mmol/L (ref 22–32)
Calcium: 9.1 mg/dL (ref 8.9–10.3)
Chloride: 104 mmol/L (ref 98–111)
Creatinine, Ser: 1 mg/dL (ref 0.61–1.24)
GFR, Estimated: >60 mL/min
Glucose, Bld: 92 mg/dL (ref 70–99)
Potassium: 4 mmol/L (ref 3.5–5.1)
Sodium: 137 mmol/L (ref 135–145)

## 2022-07-27 LAB — CBC
HCT: 43.4 % (ref 39.0–52.0)
Hemoglobin: 14.3 g/dL (ref 13.0–17.0)
MCH: 31.4 pg (ref 26.0–34.0)
MCHC: 32.9 g/dL (ref 30.0–36.0)
MCV: 95.2 fL (ref 80.0–100.0)
Platelets: 136 K/uL — ABNORMAL LOW (ref 150–400)
RBC: 4.56 MIL/uL (ref 4.22–5.81)
RDW: 13.1 % (ref 11.5–15.5)
WBC: 8 K/uL (ref 4.0–10.5)
nRBC: 0 % (ref 0.0–0.2)

## 2022-07-27 LAB — SARS CORONAVIRUS 2 BY RT PCR: SARS Coronavirus 2 by RT PCR: NEGATIVE

## 2022-07-27 SURGERY — BRONCHOSCOPY, WITH BIOPSY USING ELECTROMAGNETIC NAVIGATION
Anesthesia: General

## 2022-07-27 MED ORDER — ROCURONIUM BROMIDE 10 MG/ML (PF) SYRINGE
PREFILLED_SYRINGE | INTRAVENOUS | Status: DC | PRN
  Administered 2022-07-27: 60 mg via INTRAVENOUS

## 2022-07-27 MED ORDER — LACTATED RINGERS IV SOLN: INTRAVENOUS | Status: DC

## 2022-07-27 MED ORDER — DEXAMETHASONE SODIUM PHOSPHATE 10 MG/ML IJ SOLN
INTRAMUSCULAR | Status: DC | PRN
  Administered 2022-07-27: 10 mg via INTRAVENOUS

## 2022-07-27 MED ORDER — FENTANYL CITRATE (PF) 100 MCG/2ML IJ SOLN
INTRAMUSCULAR | Status: DC | PRN
  Administered 2022-07-27: 50 ug via INTRAVENOUS

## 2022-07-27 MED ORDER — LIDOCAINE 2% (20 MG/ML) 5 ML SYRINGE
INTRAMUSCULAR | Status: DC | PRN
  Administered 2022-07-27: 80 mg via INTRAVENOUS

## 2022-07-27 MED ORDER — ONDANSETRON HCL 4 MG/2ML IJ SOLN
INTRAMUSCULAR | Status: DC | PRN
  Administered 2022-07-27: 4 mg via INTRAVENOUS

## 2022-07-27 MED ORDER — SUGAMMADEX SODIUM 200 MG/2ML IV SOLN
INTRAVENOUS | Status: DC | PRN
  Administered 2022-07-27: 200 mg via INTRAVENOUS

## 2022-07-27 MED ORDER — CHLORHEXIDINE GLUCONATE 0.12 % MT SOLN
OROMUCOSAL | Status: AC
  Administered 2022-07-27: 15 mL via OROMUCOSAL
  Filled 2022-07-27: qty 15

## 2022-07-27 MED ORDER — PROPOFOL 500 MG/50ML IV EMUL
INTRAVENOUS | Status: DC | PRN
  Administered 2022-07-27: 100 ug/kg/min via INTRAVENOUS

## 2022-07-27 MED ORDER — CHLORHEXIDINE GLUCONATE 0.12 % MT SOLN: 15.0000 mL | Freq: Once | OROMUCOSAL | Status: AC

## 2022-07-27 MED ORDER — PHENYLEPHRINE HCL-NACL 20-0.9 MG/250ML-% IV SOLN
INTRAVENOUS | Status: DC | PRN
  Administered 2022-07-27: 30 ug/min via INTRAVENOUS

## 2022-07-27 MED ORDER — ACETAMINOPHEN 500 MG PO TABS
1000.0000 mg | ORAL_TABLET | Freq: Once | ORAL | Status: AC
  Administered 2022-07-27: 1000 mg via ORAL
  Filled 2022-07-27: qty 2

## 2022-07-27 MED ORDER — PROPOFOL 10 MG/ML IV BOLUS
INTRAVENOUS | Status: DC | PRN
  Administered 2022-07-27: 140 mg via INTRAVENOUS

## 2022-07-27 SURGICAL SUPPLY — 30 items
BRUSH CYTOL CELLEBRITY 1.5X140 (MISCELLANEOUS) IMPLANT
CANISTER SUCT 3000ML PPV (MISCELLANEOUS) ×3 IMPLANT
CONT SPEC 4OZ CLIKSEAL STRL BL (MISCELLANEOUS) ×3 IMPLANT
COVER BACK TABLE 60X90IN (DRAPES) ×3 IMPLANT
COVER DOME SNAP 22 D (MISCELLANEOUS) ×3 IMPLANT
FORCEPS BIOP RJ4 1.8 (CUTTING FORCEPS) IMPLANT
GAUZE SPONGE 4X4 12PLY STRL (GAUZE/BANDAGES/DRESSINGS) ×3 IMPLANT
GLOVE BIO SURGEON STRL SZ7.5 (GLOVE) ×3 IMPLANT
GOWN STRL REUS W/ TWL LRG LVL3 (GOWN DISPOSABLE) ×3 IMPLANT
GOWN STRL REUS W/TWL LRG LVL3 (GOWN DISPOSABLE) ×3
KIT CLEAN ENDO COMPLIANCE (KITS) ×6 IMPLANT
KIT TURNOVER KIT B (KITS) ×3 IMPLANT
MARKER SKIN DUAL TIP RULER LAB (MISCELLANEOUS) ×3 IMPLANT
NDL EBUS SONO TIP PENTAX (NEEDLE) ×3 IMPLANT
NEEDLE EBUS SONO TIP PENTAX (NEEDLE) ×3 IMPLANT
NS IRRIG 1000ML POUR BTL (IV SOLUTION) ×3 IMPLANT
OIL SILICONE PENTAX (PARTS (SERVICE/REPAIRS)) ×3 IMPLANT
PAD ARMBOARD 7.5X6 YLW CONV (MISCELLANEOUS) ×6 IMPLANT
SOL ANTI FOG 6CC (MISCELLANEOUS) ×3 IMPLANT
SYR 20CC LL (SYRINGE) ×6 IMPLANT
SYR 20ML ECCENTRIC (SYRINGE) ×6 IMPLANT
SYR 50ML SLIP (SYRINGE) IMPLANT
SYR 5ML LUER SLIP (SYRINGE) ×3 IMPLANT
TOWEL OR 17X24 6PK STRL BLUE (TOWEL DISPOSABLE) ×3 IMPLANT
TRAP SPECIMEN MUCOUS 40CC (MISCELLANEOUS) IMPLANT
TUBE CONNECTING 20X1/4 (TUBING) ×6 IMPLANT
UNDERPAD 30X30 (UNDERPADS AND DIAPERS) ×3 IMPLANT
VALVE DISPOSABLE (MISCELLANEOUS) ×3 IMPLANT
WATER STERILE IRR 1000ML POUR (IV SOLUTION) ×3 IMPLANT
fiducial IMPLANT

## 2022-07-27 NOTE — Transfer of Care (Signed)
Immediate Anesthesia Transfer of Care Note  Patient: Michael [name]  Procedure(s) Performed: ROBOTIC ASSISTED NAVIGATIONAL BRONCHOSCOPY VIDEO BRONCHOSCOPY WITH ENDOBRONCHIAL ULTRASOUND (Left) BRONCHIAL NEEDLE ASPIRATION BIOPSIES BRONCHIAL BIOPSIES BRONCHIAL BRUSHINGS FIDUCIAL MARKER PLACEMENT  Patient Location: Endoscopy Unit  Anesthesia Type:General  Level of Consciousness: awake and drowsy  Airway & Oxygen Therapy: Patient Spontanous Breathing  Post-op Assessment: Report given to RN, Post -op Vital signs reviewed and stable, and Patient moving all extremities X 4  Post vital signs: Reviewed and stable  Last Vitals:  Vitals Value Taken Time  BP 124/59   Temp    Pulse 68   Resp 14   SpO2 100     Last Pain:  Vitals:   07/27/22 1056  TempSrc:   PainSc: 0-No pain      Patients Stated Pain Goal: 0 ([id number])  Complications: No notable events documented.

## 2022-07-27 NOTE — Anesthesia Procedure Notes (Signed)
Procedure Name: Intubation Date/Time: 07/27/2022 12:50 PM  Performed by: Harden Mo, CRNAPre-anesthesia Checklist: Patient identified, Emergency Drugs available, Suction available and Patient being monitored Patient Re-evaluated:Patient Re-evaluated prior to induction Oxygen Delivery Method: Circle System Utilized Preoxygenation: Pre-oxygenation with 100% oxygen Induction Type: IV induction Ventilation: Mask ventilation without difficulty and Oral airway inserted - appropriate to patient size Laryngoscope Size: Sabra Heck and 2 Grade View: Grade I Tube type: Oral Tube size: 8.5 mm Number of attempts: 1 Airway Equipment and Method: Stylet and Oral airway Placement Confirmation: ETT inserted through vocal cords under direct vision, positive ETCO2 and breath sounds checked- equal and bilateral Secured at: 23 cm Tube secured with: Tape Dental Injury: Teeth and Oropharynx as per pre-operative assessment

## 2022-07-27 NOTE — Progress Notes (Signed)
Skin Assessment- Small raised skin lesion noted on left posterior forearm.

## 2022-07-27 NOTE — Telephone Encounter (Signed)
-----   Message from [place], DO sent at 07/27/2022  2:25 PM EST ----- I received a message from this patient's Pulmonologist regarding an abnormal PET/CT finding in the stomach.  Can you please set up for an appointment with me and subsequent EGD in the [place].  Thanks.

## 2022-07-27 NOTE — Op Note (Signed)
Video Bronchoscopy with Robotic Assisted Bronchoscopic Navigation   Date of Operation: 07/27/2022   Pre-op Diagnosis: Left upper lobe pulmonary nodule  Post-op Diagnosis: Left upper lobe pulmonary nodule  Surgeon: Garner Nash, DO   Assistants: None   Anesthesia: General endotracheal anesthesia  Operation: Flexible video fiberoptic bronchoscopy with robotic assistance and biopsies.  Estimated Blood Loss: Minimal  Complications: None  Indications and History: Michael [name] is a 72 y.o. male with history of left upper lobe pulmonary nodule. The risks, benefits, complications, treatment options and expected outcomes were discussed with the patient.  The possibilities of pneumothorax, pneumonia, reaction to medication, pulmonary aspiration, perforation of a viscus, bleeding, failure to diagnose a condition and creating a complication requiring transfusion or operation were discussed with the patient who freely signed the consent.    Description of Procedure: The patient was seen in the Preoperative Area, was examined and was deemed appropriate to proceed.  The patient was taken to [facility] endoscopy room 3, identified as Michael [name] and the procedure verified as Flexible Video Fiberoptic Bronchoscopy.  A Time Out was held and the above information confirmed.   Prior to the date of the procedure a high-resolution CT scan of the chest was performed. Utilizing ION software program a virtual tracheobronchial tree was generated to allow the creation of distinct navigation pathways to the patient's parenchymal abnormalities. After being taken to the operating room general anesthesia was initiated and the patient  was orally intubated. The video fiberoptic bronchoscope was introduced via the endotracheal tube and a general inspection was performed which showed normal right and left lung anatomy, aspiration of the bilateral mainstems was completed to remove any remaining secretions. Robotic  catheter inserted into patient's endotracheal tube.   Target #1 left upper lobe pulmonary nodule: The distinct navigation pathways prepared prior to this procedure were then utilized to navigate to patient's lesion identified on CT scan. The robotic catheter was secured into place and the vision probe was withdrawn.  Lesion location was approximated using fluoroscopy and three-dimensional cone beam CT imaging for CT-guided needle placement and for peripheral targeting. Under fluoroscopic guidance transbronchial needle brushings, transbronchial needle biopsies, and transbronchial forceps biopsies were performed to be sent for cytology and pathology.  Following tissue sampling a single fiducial was placed in proximity of the lesion with a fiducial catheter wire delivery kit under fluoroscopic guidance.  At the end of the procedure a general airway inspection was performed and there was no evidence of active bleeding. The bronchoscope was removed.  The patient tolerated the procedure well. There was no significant blood loss and there were no obvious complications. A post-procedural chest x-ray is pending.  Samples Target #1: 1. Transbronchial needle brushings from left upper lobe nodule 2. Transbronchial Wang needle biopsies from left upper lobe nodule 3. Transbronchial forceps biopsies from left upper lobe nodule  Plans:  The patient will be discharged from the PACU to home when recovered from anesthesia and after chest x-ray is reviewed. We will review the cytology, pathology results with the patient when they become available. Outpatient followup will be with Garner Nash, DO.  Garner Nash, DO [facility] 07/27/2022 1:43 PM

## 2022-07-27 NOTE — Progress Notes (Signed)
Reviewed at bronchoscopy procedure before. Appt next week for follow up scheduled.  Possible surgical candidate once path is back and pfts complete   Thanks,  BLI  Garner Nash, DO [facility] 07/27/2022 2:13 PM

## 2022-07-27 NOTE — Interval H&P Note (Signed)
History and Physical Interval Note:  07/27/2022 12:40 PM  Michael [name]  has presented today for surgery, with the diagnosis of hypermetabolic LUL lung nodule.  The various methods of treatment have been discussed with the patient and family. After consideration of risks, benefits and other options for treatment, the patient has consented to  Procedure(s): ROBOTIC ASSISTED NAVIGATIONAL BRONCHOSCOPY (N/A) VIDEO BRONCHOSCOPY WITH ENDOBRONCHIAL ULTRASOUND (Left) as a surgical intervention.  The patient's history has been reviewed, patient examined, no change in status, stable for surgery.  I have reviewed the patient's chart and labs.  Questions were answered to the patient's satisfaction.     [place]

## 2022-07-27 NOTE — Telephone Encounter (Signed)
Lm on vm for patient to return call to review appointments.  Office visit scheduled for 08/04/22 at 2:40 pm with Dr. Bryan Lemma. EGD scheduled in the [place] on Friday, 08/13/22 at 10 am. Pt will need to arrive at 9 am with a care partner. Will send instructions to patient once he confirms appt.

## 2022-07-27 NOTE — Anesthesia Postprocedure Evaluation (Signed)
Anesthesia Post Note  Patient: Michael [name]  Procedure(s) Performed: ROBOTIC ASSISTED NAVIGATIONAL BRONCHOSCOPY VIDEO BRONCHOSCOPY WITH ENDOBRONCHIAL ULTRASOUND (Left) BRONCHIAL NEEDLE ASPIRATION BIOPSIES BRONCHIAL BIOPSIES BRONCHIAL BRUSHINGS FIDUCIAL MARKER PLACEMENT     Patient location during evaluation: PACU Anesthesia Type: General Level of consciousness: awake and alert Pain management: pain level controlled Vital Signs Assessment: post-procedure vital signs reviewed and stable Respiratory status: spontaneous breathing, nonlabored ventilation, respiratory function stable and patient connected to nasal cannula oxygen Cardiovascular status: blood pressure returned to baseline and stable Postop Assessment: no apparent nausea or vomiting Anesthetic complications: no   No notable events documented.  Last Vitals:  Vitals:   07/27/22 1410 07/27/22 1420  BP: (!) 142/89 (!) 156/79  Pulse: 69 69  Resp: 15 16  Temp:    SpO2: 95% 95%    Last Pain:  Vitals:   07/27/22 1420  TempSrc:   PainSc: 0-No pain                 [place]

## 2022-07-27 NOTE — Discharge Instructions (Signed)

## 2022-07-29 ENCOUNTER — Encounter (HOSPITAL_COMMUNITY): Payer: Self-pay | Admitting: Pulmonary Disease

## 2022-07-29 NOTE — Telephone Encounter (Signed)
Called and spoke with patient this morning regarding recommendations for OV and EGD. I provided patient with his appointment information and the office address. Pt was not sure why he needed EGD, I explained to him that their was an abnormality of his stomach. I explained to patient that with an EGD Dr. Bryan Lemma will be able to visualize his stomach and further assess. Pt understands that he needs to arrive in the [place] on 08/13/22 at 9 am with a care partner. Pt is aware that he will receive his written instructions at the time of his office visit. Pt verbalized understanding of all information and had no concerns at the end of the call.  Ambulatory referral to GI in epic.

## 2022-07-30 LAB — CYTOLOGY - NON PAP

## 2022-08-04 ENCOUNTER — Ambulatory Visit: Payer: Medicare PPO | Admitting: Gastroenterology

## 2022-08-04 ENCOUNTER — Encounter: Payer: Self-pay | Admitting: Gastroenterology

## 2022-08-04 VITALS — BP 120/70 | HR 77 | Ht 71.0 in | Wt 162.0 lb

## 2022-08-04 DIAGNOSIS — J449 Chronic obstructive pulmonary disease, unspecified: Secondary | ICD-10-CM

## 2022-08-04 DIAGNOSIS — Z86010 Personal history of colonic polyps: Secondary | ICD-10-CM

## 2022-08-04 DIAGNOSIS — R948 Abnormal results of function studies of other organs and systems: Secondary | ICD-10-CM | POA: Diagnosis not present

## 2022-08-04 DIAGNOSIS — Z720 Tobacco use: Secondary | ICD-10-CM | POA: Diagnosis not present

## 2022-08-04 DIAGNOSIS — I2510 Atherosclerotic heart disease of native coronary artery without angina pectoris: Secondary | ICD-10-CM

## 2022-08-04 DIAGNOSIS — R918 Other nonspecific abnormal finding of lung field: Secondary | ICD-10-CM

## 2022-08-04 NOTE — Progress Notes (Signed)
Chief Complaint: Abnormal PET/CT   Referring Provider:     June Leap, DO   HPI:     Michael [name] is a 72 y.o. male with a history of COPD with emphysema, HTN, HLD, CAD, GERD, BPH, tobacco use disorder, referred to the Gastroenterology Clinic for evaluation of abnormal PET/CT.   -06/04/2022: CT chest lung cancer screening protocol: Normal esophagus.  Aggressive appearing left upper lobe pulmonary nodule characterized as lung RADS 4 BS, moderate central lobar and paraseptal emphysema.  Atherosclerosis - 07/05/2022: PET/CT: Mild hypermetabolism in the gastric fundus (SUV max 5.3).  7 mm hypermetabolic LUL pulmonary nodule most consistent with stage Ia primary bronchogenic carcinoma.  Right-sided prostatic hypermetabolism, 3 left parotid hypermetabolic nodules suspicious for incidental primary neoplasms. - 07/27/2022: Robotic assisted navigational bronchoscopy by Dr. Valeta Harms.  FNA and brush biopsies negative for malignancy  He is otherwise without any GI symptoms.  No dysphagia, abdominal pain, reflux symptoms, nausea/vomiting, change in bowel habits, hematochezia, melena.   No prior EGD.   From a COPD standpoint, he has never had PFTs in the past.  He was scheduled for PFTs tomorrow, but that was rescheduled to 3/28 due to machine issue in [facility].   Last colonsocopy was 01/12/2019 by Dr. Farris Has in [place] and notable for 5 mm rectal polyp, 5 mm transverse colon polyp, grade 2 internal hemorrhoids, with recommendation to repeat in 5 years.  Prior to that, colonoscopy 11/10/2012 by Dr. Davina Poke notable for multiple subcentimeter rectal hyperplastic polyps.   Has appt with Urology next month.   Still smoking 1 PPD.  Currently being treated with Wellbutrin through the Pulmonary clinic.   Past Medical History:  Diagnosis Date   Arthritis    right wrist   BPH (benign prostatic hyperplasia)    COPD (chronic obstructive pulmonary disease) (HCC)    via CT lung    Dyspnea    GERD (gastroesophageal reflux disease)    yrs ago, no current problems, no meds as of 07/23/22   Hyperlipidemia    Hypertension    Hypogonadism male    Myocardial infarction [facility]) 2005   Per patient no stent or PTCA, just medical treatment   Pneumonia    x 1   Prediabetes    Vitamin D deficiency      Past Surgical History:  Procedure Laterality Date   AMPUTATION FINGER / THUMB Left 1991   distal index   BRONCHIAL BIOPSY  07/27/2022   Procedure: BRONCHIAL BIOPSIES;  Surgeon: Garner Nash, DO;  Location: [facility];  Service: Pulmonary;;   BRONCHIAL BRUSHINGS  07/27/2022   Procedure: BRONCHIAL BRUSHINGS;  Surgeon: Garner Nash, DO;  Location: [facility];  Service: Pulmonary;;   BRONCHIAL NEEDLE ASPIRATION BIOPSY  07/27/2022   Procedure: BRONCHIAL NEEDLE ASPIRATION BIOPSIES;  Surgeon: Garner Nash, DO;  Location: [facility];  Service: Pulmonary;;   CARDIAC CATHETERIZATION N/A 2005   Per patient no stent or PTCA, just medical treatment   CATARACT EXTRACTION, BILATERAL Bilateral 2014   Dr. Talbert Forest    colonoscopy N/A 10/2012   Neg and recc f/u Cololnoscopy in 5 yeary - Dr Nicki Reaper O/[place] - Gastroenterologist   FIDUCIAL MARKER PLACEMENT  07/27/2022   Procedure: FIDUCIAL MARKER PLACEMENT;  Surgeon: Garner Nash, DO;  Location: [facility];  Service: Pulmonary;;   PROSTATE SURGERY     Biopsy X 3   VASECTOMY     VIDEO BRONCHOSCOPY WITH ENDOBRONCHIAL  ULTRASOUND Left 07/27/2022   Procedure: VIDEO BRONCHOSCOPY WITH ENDOBRONCHIAL ULTRASOUND;  Surgeon: Garner Nash, DO;  Location: [facility];  Service: Pulmonary;  Laterality: Left;   Family History  Problem Relation Age of Onset   Cancer Father 61       lymphoma   Cancer Maternal Aunt        Lung   Cancer Maternal Uncle        Lung   Cancer Maternal Uncle        Lung   Stroke Maternal Grandmother    Heart failure Maternal Grandfather    Pneumonia Paternal Grandmother    Liver disease Paternal Grandfather     Stroke Son 19   Other Son        internal intestinal hemorage   Social History   Tobacco Use   Smoking status: Every Day    Packs/day: 1.00    Years: 59.00    Total pack years: 59.00    Types: Cigarettes    Start date: 1965   Smokeless tobacco: Never  Vaping Use   Vaping Use: Never used  Substance Use Topics   Alcohol use: Not Currently    Alcohol/week: 5.0 standard drinks of alcohol    Types: 5 Cans of beer per week    Comment: occasional   Drug use: No   Current Outpatient Medications  Medication Sig Dispense Refill   acetaminophen (TYLENOL) 500 MG tablet Take 1,000 mg by mouth at bedtime as needed for moderate pain or mild pain.     albuterol (VENTOLIN HFA) 108 (90 Base) MCG/ACT inhaler Inhale 1-2 puffs into the lungs every 6 (six) hours as needed for wheezing or shortness of breath. 54 g 1   ascorbic acid (VITAMIN C) 500 MG tablet Take 1,000 mg by mouth daily.     aspirin EC 81 MG tablet Take 81 mg by mouth daily.     B Complex-C (SUPER B COMPLEX PO) Take 1 tablet by mouth daily.     bisoprolol-hydrochlorothiazide (ZIAC) 10-6.25 MG tablet TAKE 1 TABLET EVERY DAY FOR BLOOD PRESSURE (Patient taking differently: 0.5 tablets. TAKE 1 TABLET EVERY DAY FOR BLOOD PRESSURE) 90 tablet 3   buPROPion (WELLBUTRIN SR) 150 MG 12 hr tablet Take 1 tablet (150 mg total) by mouth 2 (two) times daily. Take one tab daily for 3 days then increase to twice daily (Patient taking differently: Take 150 mg by mouth daily.) 60 tablet 2   Cholecalciferol (VITAMIN D) 50 MCG (2000 UT) CAPS 4,000 Units daily.     Fluticasone-Umeclidin-Vilant (TRELEGY ELLIPTA) 100-62.5-25 MCG/ACT AEPB Use  1 Inhalation  Daily  for COPD / Asthma 180 each 3   Guaifenesin 1200 MG TB12 Take 1,200 mg by mouth 2 (two) times daily.     Magnesium 250 MG TABS Take 250 mg by mouth daily as needed (Constipation).     Multiple Vitamin (MULTIVITAMIN) tablet Take 1 tablet by mouth daily. Takes a vitamin pack daily     Omega-3 Fatty  Acids (OMEGA 3 PO) Take 1 tablet by mouth daily. Takes Omega Red     saw palmetto 500 MG capsule Take 1,000 mg by mouth daily.     No current facility-administered medications for this visit.   Allergies  Allergen Reactions   Ace Inhibitors Cough   Ciprofloxacin Other (See Comments)    Unknown   Fenofibrate     dizzy     Review of Systems: All systems reviewed and negative except where noted in HPI.  Physical Exam:    Wt Readings from Last 3 Encounters:  08/04/22 162 lb (73.5 kg)  07/27/22 172 lb 5.1 oz (78.2 kg)  07/09/22 172 lb 3.2 oz (78.1 kg)    BP 120/70   Pulse 77   Ht '5\' 11"'$  (1.803 m)   Wt 162 lb (73.5 kg)   BMI 22.59 kg/m  Constitutional:  Pleasant, in no acute distress. Psychiatric: Normal mood and affect. Behavior is normal. Abdominal: Soft, nondistended Neurological: Alert and oriented to person place and time.   ASSESSMENT AND PLAN;   1) Abnormal PET/CT Recent PET/CT with hypermetabolic activity in the gastric fundus (SUV max 5.3).  Discussed DDx for that finding today, to include gastritis, ulcer, physiologic artifact, or malignant etiology, and recommended endoscopic evaluation, and he agrees. - Currently scheduled for EGD with me on 08/13/2022 at 10 AM.  Will message Anesthesia service for chart review ahead of time to ensure appropriate for [facility] given history of COPD/emphysema.  Does not use any home O2.  2) COPD - Continue follow-up in the Pulmonary Clinic - Scheduled for PFTs later this month - As above, will confer with the Anesthesia service to discuss appropriate location for sedation/EGD  3) Pulmonary nodules - Continue follow-up with Dr. Valeta Harms  4) Tobacco use disorder - Smoking cessation  5) CAD - Ok to resume ASA 81 mg in the perioperative setting  6) History of colon polyps - Repeat colonoscopy in 2025  The indications, risks, and benefits of EGD were explained to the patient in detail. Risks include but are not limited to  bleeding, perforation, adverse reaction to medications, and cardiopulmonary compromise. Sequelae include but are not limited to the possibility of surgery, hospitalization, and mortality. The patient verbalized understanding and wished to proceed. All questions answered, referred to scheduler. Further recommendations pending results of the exam.     Lavena Bullion, DO, FACG  08/04/2022, 2:48 PM   Unk Pinto, MD

## 2022-08-04 NOTE — Patient Instructions (Signed)
You have been scheduled for an endoscopy. Please follow written instructions given to you at your visit today. If you use inhalers (even only as needed), please bring them with you on the day of your procedure.  The [facility] providers would like to encourage you to use MYCHART to communicate with providers for non-urgent requests or questions.  Due to long hold times on the telephone, sending your provider a message by MYCHART may be a faster and more efficient way to get a response.  Please allow 48 business hours for a response.  Please remember that this is for non-urgent requests.   Due to recent changes in healthcare laws, you may see the results of your imaging and laboratory studies on MyChart before your provider has had a chance to review them.  We understand that in some cases there may be results that are confusing or concerning to you. Not all laboratory results come back in the same time frame and the provider may be waiting for multiple results in order to interpret others.  Please give us 48 hours in order for your provider to thoroughly review all the results before contacting the office for clarification of your results.   

## 2022-08-05 ENCOUNTER — Ambulatory Visit: Payer: Medicare PPO | Admitting: Nurse Practitioner

## 2022-08-06 ENCOUNTER — Telehealth: Payer: Self-pay

## 2022-08-06 NOTE — Telephone Encounter (Signed)
-----   Message from Lavena Bullion, DO sent at 08/06/2022 11:21 AM EDT ----- Regarding: RE: Thanks John!   Junie Avilla,  See below. Patient good to proceed with procedure in [place] as scheduled.   VC ----- Message ----- From: Osvaldo Angst, CRNA Sent: 08/05/2022   8:46 AM EDT To: Lavena Bullion, DO  Dr. Bryan Lemma,  This pt is cleared for anesthetic care at [facility].  Thanks,  JMN   ----- Message ----- From: Lavena Bullion, DO Sent: 08/04/2022   3:04 PM EDT To: Osvaldo Angst, CRNA  Thoughts on this patient having EGD in the [facility]?  He is currently scheduled for 08/13/2022 for EGD for evaluation of abnormal PET/CT.  He has a history of COPD, but no home O2.  He has never had PFTs done in the past, so we do not technically know the severity of this.  He was supposed to have PFTs done tomorrow, but apparently the machine in the Pulmonary Clinic broke over the weekend and that is rescheduled for 3/28.  Clinically, he describes shortness of breath with 2 or more flights of stairs, and has been that way for many years.  Still smoking 1 PPD.  Let me know what you think.  Thanks!  [employer]

## 2022-08-11 ENCOUNTER — Ambulatory Visit: Payer: Medicare PPO | Admitting: Nurse Practitioner

## 2022-08-13 ENCOUNTER — Telehealth: Payer: Self-pay

## 2022-08-13 ENCOUNTER — Encounter: Payer: Self-pay | Admitting: Gastroenterology

## 2022-08-13 ENCOUNTER — Ambulatory Visit (AMBULATORY_SURGERY_CENTER): Payer: Medicare PPO | Admitting: Gastroenterology

## 2022-08-13 VITALS — BP 124/72 | HR 63 | Temp 98.0°F | Resp 21 | Ht 71.0 in | Wt 162.0 lb

## 2022-08-13 DIAGNOSIS — K2951 Unspecified chronic gastritis with bleeding: Secondary | ICD-10-CM | POA: Diagnosis not present

## 2022-08-13 DIAGNOSIS — I10 Essential (primary) hypertension: Secondary | ICD-10-CM | POA: Diagnosis not present

## 2022-08-13 DIAGNOSIS — K2970 Gastritis, unspecified, without bleeding: Secondary | ICD-10-CM

## 2022-08-13 DIAGNOSIS — R948 Abnormal results of function studies of other organs and systems: Secondary | ICD-10-CM

## 2022-08-13 DIAGNOSIS — R7303 Prediabetes: Secondary | ICD-10-CM | POA: Diagnosis not present

## 2022-08-13 DIAGNOSIS — B9681 Helicobacter pylori [H. pylori] as the cause of diseases classified elsewhere: Secondary | ICD-10-CM | POA: Diagnosis not present

## 2022-08-13 DIAGNOSIS — K2971 Gastritis, unspecified, with bleeding: Secondary | ICD-10-CM | POA: Diagnosis not present

## 2022-08-13 DIAGNOSIS — E785 Hyperlipidemia, unspecified: Secondary | ICD-10-CM | POA: Diagnosis not present

## 2022-08-13 DIAGNOSIS — J449 Chronic obstructive pulmonary disease, unspecified: Secondary | ICD-10-CM | POA: Diagnosis not present

## 2022-08-13 MED ORDER — OMEPRAZOLE 20 MG PO CPDR: 20.0000 mg | DELAYED_RELEASE_CAPSULE | Freq: Two times a day (BID) | ORAL | 0 refills | Status: DC

## 2022-08-13 MED ORDER — SODIUM CHLORIDE 0.9 % IV SOLN: 500.0000 mL | INTRAVENOUS | Status: DC

## 2022-08-13 NOTE — Progress Notes (Signed)
Called to room to assist during endoscopic procedure.  Patient ID and intended procedure confirmed with present staff. Received instructions for my participation in the procedure from the performing physician.  

## 2022-08-13 NOTE — Progress Notes (Signed)
Uneventful anesthetic. Report to pacu rn. Vss. Care resumed by rn. 

## 2022-08-13 NOTE — Progress Notes (Unsigned)
Patient: Michael [name] DOB: 03/21/2051  HC reviewing pts chart prior to general review call. Reviewing office visits, consults, hospital visits, lab and medication adherence/changes. Chart review complete.   General Review (HC) Chart Review Have there been any documented new, changed, or discontinued medications since last visit?  (Include name, dose, frequency, date): 06/24/22 Dr. Valeta Harms (Pulm) - Start: Bupropion 150mg  - 1 tab daily x3days then increase to twice daily. 05/18/22 Dr. Melford Aase (PCP) - Stop: Ipratropium Albuterol, Prednisone 20mg  05/03/22 [place], NP (PCP) - Start: Azithromycin 500mg  daily, Ipratropium-Albuterol 0.5-2.98ml/3ml, Prednisone 20mg  Has there been any documented recent hospitalizations or ED visits since last visit with Clinical Lead?: Yes Brief Summary (including Medication and/or Diagnosis changes):: 07/27/22 - [facility] - Pulmonary nodules/robotic assisted navigational bronchoscopy - no med changes Adherence Review Does the [facility] have access to medication refill data?: No   HC will call pt another time to complete review.  Total time spent: 50min

## 2022-08-13 NOTE — Op Note (Signed)
[facility] Patient Name: Michael [name] Procedure Date: 08/13/2022 10:06 AM MRN: [id number] Endoscopist: Gerrit Heck , MD, [id number] Age: 72 Referring MD:  Date of Birth: 03-28-1951 Gender: Male Account #: [id number] Procedure:                Upper GI endoscopy Indications:              Abnormal PET scan of the GI tract Medicines:                Monitored Anesthesia Care Procedure:                Pre-Anesthesia Assessment:                           - Prior to the procedure, a History and Physical                            was performed, and patient medications and                            allergies were reviewed. The patient's tolerance of                            previous anesthesia was also reviewed. The risks                            and benefits of the procedure and the sedation                            options and risks were discussed with the patient.                            All questions were answered, and informed consent                            was obtained. Prior Anticoagulants: The patient has                            taken no anticoagulant or antiplatelet agents                            except for aspirin. ASA Grade Assessment: III - A                            patient with severe systemic disease. After                            reviewing the risks and benefits, the patient was                            deemed in satisfactory condition to undergo the                            procedure.  After obtaining informed consent, the endoscope was                            passed under direct vision. Throughout the                            procedure, the patient's blood pressure, pulse, and                            oxygen saturations were monitored continuously. The                            GIF [id number] was introduced through the                            mouth, and advanced to the second part of  duodenum.                            The upper GI endoscopy was accomplished without                            difficulty. The patient tolerated the procedure                            well. Scope In: Scope Out: Findings:                 The examined esophagus was normal.                           The Z-line was regular and was found 40 cm from the                            incisors.                           Diffuse mild inflammation characterized by                            congestion (edema) and erythema was found in the                            entire examined stomach. Biopsies were taken with a                            cold forceps from the antrum and gastric body (jar                            1) and gastric fundus (jar 2) for histology and                            Helicobacter pylori testing. Estimated blood loss                            was minimal.  The examined duodenum was normal. Complications:            No immediate complications. Estimated Blood Loss:     Estimated blood loss was minimal. Impression:               - Normal esophagus.                           - Z-line regular, 40 cm from the incisors.                           - Gastritis. Biopsied.                           - Normal examined duodenum. Recommendation:           - Patient has a contact number available for                            emergencies. The signs and symptoms of potential                            delayed complications were discussed with the                            patient. Return to normal activities tomorrow.                            Written discharge instructions were provided to the                            patient.                           - Resume previous diet.                           - Continue present medications.                           - Await pathology results.                           - Return to GI clinic PRN.                            - Use Prilosec (omeprazole) 20 mg PO BID for 4                            weeks to promote mucosal healing, then reduce to 20                            mg daily for 2 weeks, then can discontinue.                           - Follow-up with Dr. Valeta Harms in the Pulmonary Clinic. Gerrit Heck, MD 08/13/2022 10:26:11 AM

## 2022-08-13 NOTE — Patient Instructions (Addendum)
Information on gastritis given to you today.  Await pathology results from the biopsies taken today.  Resume previous diet and medications.  Use Prilosec (omeprazole) 20 mg by mouth twice a day for 4 weeks to promote mucosal healing then reduce to 20 mg daily for 2 weeks, then discontinue.  Follow up with Dr. Valeta Harms in Pulmonolgy.   YOU HAD AN ENDOSCOPIC PROCEDURE TODAY AT [facility]:   Refer to the procedure report that was given to you for any specific questions about what was found during the examination.  If the procedure report does not answer your questions, please call your gastroenterologist to clarify.  If you requested that your care partner not be given the details of your procedure findings, then the procedure report has been included in a sealed envelope for you to review at your convenience later.  YOU SHOULD EXPECT: Some feelings of bloating in the abdomen. Passage of more gas than usual.  Walking can help get rid of the air that was put into your GI tract during the procedure and reduce the bloating. If you had a lower endoscopy (such as a colonoscopy or flexible sigmoidoscopy) you may notice spotting of blood in your stool or on the toilet paper. If you underwent a bowel prep for your procedure, you may not have a normal bowel movement for a few days.  Please Note:  You might notice some irritation and congestion in your nose or some drainage.  This is from the oxygen used during your procedure.  There is no need for concern and it should clear up in a day or so.  SYMPTOMS TO REPORT IMMEDIATELY:  Following upper endoscopy (EGD)  Vomiting of blood or coffee ground material  New chest pain or pain under the shoulder blades  Painful or persistently difficult swallowing  New shortness of breath  Fever of 100F or higher  Black, tarry-looking stools  For urgent or emergent issues, a gastroenterologist can be reached at any hour by calling [phone]. Do  not use MyChart messaging for urgent concerns.    DIET:  We do recommend a small meal at first, but then you may proceed to your regular diet.  Drink plenty of fluids but you should avoid alcoholic beverages for 24 hours.  ACTIVITY:  You should plan to take it easy for the rest of today and you should NOT DRIVE or use heavy machinery until tomorrow (because of the sedation medicines used during the test).    FOLLOW UP: Our staff will call the number listed on your records the next business day following your procedure.  We will call around 7:15- 8:00 am to check on you and address any questions or concerns that you may have regarding the information given to you following your procedure. If we do not reach you, we will leave a message.     If any biopsies were taken you will be contacted by phone or by letter within the next 1-3 weeks.  Please call us at [phone] if you have not heard about the biopsies in 3 weeks.    SIGNATURES/CONFIDENTIALITY: You and/or your care partner have signed paperwork which will be entered into your electronic medical record.  These signatures attest to the fact that that the information above on your After Visit Summary has been reviewed and is understood.  Full responsibility of the confidentiality of this discharge information lies with you and/or your care-partner.

## 2022-08-13 NOTE — Progress Notes (Signed)
GASTROENTEROLOGY PROCEDURE H&P NOTE   Primary Care Physician: Unk Pinto, MD    Reason for Procedure:   Abnormal PET/CT   Plan:    EGD  Patient is appropriate for endoscopic procedure(s) in the ambulatory ([place]) setting.  The nature of the procedure, as well as the risks, benefits, and alternatives were carefully and thoroughly reviewed with the patient. Ample time for discussion and questions allowed. The patient understood, was satisfied, and agreed to proceed.     HPI: Michael [name] is a 72 y.o. male who presents for EGD for evaluation of Abnormal PET/CT.  Patient was most recently seen in the Gastroenterology Clinic on 08/04/2022 by me.  No interval change in medical history since that appointment. Please refer to that note for full details regarding GI history and clinical presentation.   -06/04/2022: CT chest lung cancer screening protocol: Normal esophagus.  Aggressive appearing left upper lobe pulmonary nodule characterized as lung RADS 4 BS, moderate central lobar and paraseptal emphysema.  Atherosclerosis - 07/05/2022: PET/CT: Mild hypermetabolism in the gastric fundus (SUV max 5.3).  7 mm hypermetabolic LUL pulmonary nodule most consistent with stage Ia primary bronchogenic carcinoma.  Right-sided prostatic hypermetabolism, 3 left parotid hypermetabolic nodules suspicious for incidental primary neoplasms. - 07/27/2022: Robotic assisted navigational bronchoscopy by Dr. Valeta Harms.  FNA and brush biopsies negative for malignancy   He is otherwise without any GI symptoms.  No dysphagia, abdominal pain, reflux symptoms, nausea/vomiting, change in bowel habits, hematochezia, melena.    No prior EGD.   Past Medical History:  Diagnosis Date   Arthritis    right wrist   BPH (benign prostatic hyperplasia)    COPD (chronic obstructive pulmonary disease) (HCC)    via CT lung   Dyspnea    GERD (gastroesophageal reflux disease)    yrs ago, no current problems, no meds as of  07/23/22   Hyperlipidemia    Hypertension    Hypogonadism male    Myocardial infarction [facility] 2005   Per patient no stent or PTCA, just medical treatment   Pneumonia    x 1   Prediabetes    Vitamin D deficiency     Past Surgical History:  Procedure Laterality Date   AMPUTATION FINGER / THUMB Left 1991   distal index   BRONCHIAL BIOPSY  07/27/2022   Procedure: BRONCHIAL BIOPSIES;  Surgeon: Garner Nash, DO;  Location: [facility];  Service: Pulmonary;;   BRONCHIAL BRUSHINGS  07/27/2022   Procedure: BRONCHIAL BRUSHINGS;  Surgeon: Garner Nash, DO;  Location: [facility];  Service: Pulmonary;;   BRONCHIAL NEEDLE ASPIRATION BIOPSY  07/27/2022   Procedure: BRONCHIAL NEEDLE ASPIRATION BIOPSIES;  Surgeon: Garner Nash, DO;  Location: [facility];  Service: Pulmonary;;   CARDIAC CATHETERIZATION N/A 2005   Per patient no stent or PTCA, just medical treatment   CATARACT EXTRACTION, BILATERAL Bilateral 2014   Dr. Talbert Forest    colonoscopy N/A 10/2012   Neg and recc f/u Cololnoscopy in 5 yeary - Dr Nicki Reaper O/[place] - Gastroenterologist   FIDUCIAL MARKER PLACEMENT  07/27/2022   Procedure: FIDUCIAL MARKER PLACEMENT;  Surgeon: Garner Nash, DO;  Location: [facility];  Service: Pulmonary;;   PROSTATE SURGERY     Biopsy X 3   VASECTOMY     VIDEO BRONCHOSCOPY WITH ENDOBRONCHIAL ULTRASOUND Left 07/27/2022   Procedure: VIDEO BRONCHOSCOPY WITH ENDOBRONCHIAL ULTRASOUND;  Surgeon: Garner Nash, DO;  Location: [facility];  Service: Pulmonary;  Laterality: Left;    Prior to Admission medications  Medication Sig Start Date End Date Taking? Authorizing Provider  acetaminophen (TYLENOL) 500 MG tablet Take 1,000 mg by mouth at bedtime as needed for moderate pain or mild pain.   Yes [provider]  albuterol (VENTOLIN HFA) 108 (90 Base) MCG/ACT inhaler Inhale 1-2 puffs into the lungs every 6 (six) hours as needed for wheezing or shortness of breath. 05/03/22  Yes Cranford, Kenney Houseman, NP   ascorbic acid (VITAMIN C) 500 MG tablet Take 1,000 mg by mouth daily.   Yes [provider]  aspirin EC 81 MG tablet Take 81 mg by mouth daily.   Yes [provider]  B Complex-C (SUPER B COMPLEX PO) Take 1 tablet by mouth daily.   Yes [provider]  bisoprolol-hydrochlorothiazide (ZIAC) 10-6.25 MG tablet TAKE 1 TABLET EVERY DAY FOR BLOOD PRESSURE Patient taking differently: 0.5 tablets. TAKE 1 TABLET EVERY DAY FOR BLOOD PRESSURE 11/06/20  Yes Liane Comber, NP  buPROPion (WELLBUTRIN SR) 150 MG 12 hr tablet Take 1 tablet (150 mg total) by mouth 2 (two) times daily. Take one tab daily for 3 days then increase to twice daily Patient taking differently: Take 150 mg by mouth daily. 06/24/22 09/22/22 Yes Icard, Octavio Graves, DO  Cholecalciferol (VITAMIN D) 50 MCG (2000 UT) CAPS 4,000 Units daily.   Yes [provider]  Fluticasone-Umeclidin-Vilant (TRELEGY ELLIPTA) 100-62.5-25 MCG/ACT AEPB Use  1 Inhalation  Daily  for COPD / Asthma 02/01/22  Yes Unk Pinto, MD  Guaifenesin 1200 MG TB12 Take 1,200 mg by mouth 2 (two) times daily.   Yes [provider]  Magnesium 250 MG TABS Take 250 mg by mouth daily as needed (Constipation).   Yes [provider]  Multiple Vitamin (MULTIVITAMIN) tablet Take 1 tablet by mouth daily. Takes a vitamin pack daily   Yes [provider]  Omega-3 Fatty Acids (OMEGA 3 PO) Take 1 tablet by mouth daily. Takes Omega Red   Yes [provider]  saw palmetto 500 MG capsule Take 1,000 mg by mouth daily.   Yes [provider]    Current Outpatient Medications  Medication Sig Dispense Refill   acetaminophen (TYLENOL) 500 MG tablet Take 1,000 mg by mouth at bedtime as needed for moderate pain or mild pain.     albuterol (VENTOLIN HFA) 108 (90 Base) MCG/ACT inhaler Inhale 1-2 puffs into the lungs every 6 (six) hours as needed for wheezing or shortness of breath. 54 g 1   ascorbic acid (VITAMIN C) 500 MG  tablet Take 1,000 mg by mouth daily.     aspirin EC 81 MG tablet Take 81 mg by mouth daily.     B Complex-C (SUPER B COMPLEX PO) Take 1 tablet by mouth daily.     bisoprolol-hydrochlorothiazide (ZIAC) 10-6.25 MG tablet TAKE 1 TABLET EVERY DAY FOR BLOOD PRESSURE (Patient taking differently: 0.5 tablets. TAKE 1 TABLET EVERY DAY FOR BLOOD PRESSURE) 90 tablet 3   buPROPion (WELLBUTRIN SR) 150 MG 12 hr tablet Take 1 tablet (150 mg total) by mouth 2 (two) times daily. Take one tab daily for 3 days then increase to twice daily (Patient taking differently: Take 150 mg by mouth daily.) 60 tablet 2   Cholecalciferol (VITAMIN D) 50 MCG (2000 UT) CAPS 4,000 Units daily.     Fluticasone-Umeclidin-Vilant (TRELEGY ELLIPTA) 100-62.5-25 MCG/ACT AEPB Use  1 Inhalation  Daily  for COPD / Asthma 180 each 3   Guaifenesin 1200 MG TB12 Take 1,200 mg by mouth 2 (two) times daily.  Magnesium 250 MG TABS Take 250 mg by mouth daily as needed (Constipation).     Multiple Vitamin (MULTIVITAMIN) tablet Take 1 tablet by mouth daily. Takes a vitamin pack daily     Omega-3 Fatty Acids (OMEGA 3 PO) Take 1 tablet by mouth daily. Takes Omega Red     saw palmetto 500 MG capsule Take 1,000 mg by mouth daily.     Current Facility-Administered Medications  Medication Dose Route Frequency Provider Last Rate Last Admin   0.9 %  sodium chloride infusion  500 mL Intravenous Continuous Davette Nugent V, DO        Allergies as of 08/13/2022 - Review Complete 08/13/2022  Allergen Reaction Noted   Ace inhibitors Cough 03/09/2013   Ciprofloxacin Other (See Comments) 03/09/2013   Fenofibrate  03/09/2013    Family History  Problem Relation Age of Onset   Cancer Father 39       lymphoma   Cancer Maternal Aunt        Lung   Cancer Maternal Uncle        Lung   Cancer Maternal Uncle        Lung   Stroke Maternal Grandmother    Heart failure Maternal Grandfather    Pneumonia Paternal Grandmother    Liver disease Paternal  Grandfather    Stroke Son 62   Other Son        internal intestinal hemorage   Esophageal cancer Neg Hx    Stomach cancer Neg Hx     Social History   Socioeconomic History   Marital status: Divorced    Spouse name: Not on file   Number of children: Not on file   Years of education: Not on file   Highest education level: Not on file  Occupational History   Not on file  Tobacco Use   Smoking status: Every Day    Packs/day: 1.00    Years: 59.00    Additional pack years: 0.00    Total pack years: 59.00    Types: Cigarettes    Start date: 1965   Smokeless tobacco: Never  Vaping Use   Vaping Use: Never used  Substance and Sexual Activity   Alcohol use: Not Currently    Alcohol/week: 5.0 standard drinks of alcohol    Types: 5 Cans of beer per week    Comment: occasional   Drug use: No   Sexual activity: Not on file  Other Topics Concern   Not on file  Social History Narrative   Epworth Sleepiness score 5    Social Determinants of Health   Financial Resource Strain: Not on file  Food Insecurity: Not on file  Transportation Needs: Not on file  Physical Activity: Not on file  Stress: Not on file  Social Connections: Not on file  Intimate Partner Violence: Not on file    Physical Exam: Vital signs in last 24 hours: @BP  (!) 157/74   Pulse 66   Temp 98 F (36.7 C)   Ht 5\' 11"  (1.803 m)   Wt 162 lb (73.5 kg)   SpO2 98%   BMI 22.59 kg/m  GEN: NAD EYE: Sclerae anicteric ENT: MMM CV: Non-tachycardic Pulm: CTA b/l GI: Soft, NT/ND NEURO:  Alert & Oriented x 3   Gerrit Heck, DO [facility]   08/13/2022 10:04 AM

## 2022-08-16 ENCOUNTER — Telehealth: Payer: Self-pay

## 2022-08-16 NOTE — Telephone Encounter (Signed)
  Follow up Call-     08/13/2022    9:29 AM  Call back number  Post procedure Call Back phone  # [phone]  Permission to leave phone message Yes     Patient questions:  Do you have a fever, pain , or abdominal swelling? No. Pain Score  0 *  Have you tolerated food without any problems? Yes.    Have you been able to return to your normal activities? Yes.    Do you have any questions about your discharge instructions: Diet   No. Medications  No. Follow up visit  No.  Do you have questions or concerns about your Care? No.  Actions: * If pain score is 4 or above: No action needed, pain <4.

## 2022-08-19 ENCOUNTER — Encounter: Payer: Self-pay | Admitting: Nurse Practitioner

## 2022-08-19 ENCOUNTER — Ambulatory Visit: Payer: Medicare PPO | Admitting: Nurse Practitioner

## 2022-08-19 ENCOUNTER — Ambulatory Visit (INDEPENDENT_AMBULATORY_CARE_PROVIDER_SITE_OTHER): Payer: Medicare PPO | Admitting: Pulmonary Disease

## 2022-08-19 VITALS — BP 110/62 | HR 58 | Temp 97.6°F | Ht 70.0 in | Wt 164.0 lb

## 2022-08-19 DIAGNOSIS — I10 Essential (primary) hypertension: Secondary | ICD-10-CM

## 2022-08-19 DIAGNOSIS — N400 Enlarged prostate without lower urinary tract symptoms: Secondary | ICD-10-CM | POA: Diagnosis not present

## 2022-08-19 DIAGNOSIS — R911 Solitary pulmonary nodule: Secondary | ICD-10-CM

## 2022-08-19 DIAGNOSIS — Z720 Tobacco use: Secondary | ICD-10-CM | POA: Diagnosis not present

## 2022-08-19 DIAGNOSIS — R918 Other nonspecific abnormal finding of lung field: Secondary | ICD-10-CM

## 2022-08-19 DIAGNOSIS — J449 Chronic obstructive pulmonary disease, unspecified: Secondary | ICD-10-CM

## 2022-08-19 DIAGNOSIS — K118 Other diseases of salivary glands: Secondary | ICD-10-CM

## 2022-08-19 LAB — PULMONARY FUNCTION TEST
DL/VA % pred: 51 %
DL/VA: 2.04 ml/min/mmHg/L
DLCO cor % pred: 41 %
DLCO cor: 10.84 ml/min/mmHg
DLCO unc % pred: 40 %
DLCO unc: 10.75 ml/min/mmHg
FEF 25-75 Post: 0.56 L/sec
FEF 25-75 Pre: 0.53 L/sec
FEF2575-%Change-Post: 7 %
FEF2575-%Pred-Post: 23 %
FEF2575-%Pred-Pre: 21 %
FEV1-%Change-Post: 0 %
FEV1-%Pred-Post: 39 %
FEV1-%Pred-Pre: 39 %
FEV1-Post: 1.3 L
FEV1-Pre: 1.29 L
FEV1FVC-%Change-Post: -2 %
FEV1FVC-%Pred-Pre: 58 %
FEV6-%Change-Post: 3 %
FEV6-%Pred-Post: 69 %
FEV6-%Pred-Pre: 67 %
FEV6-Post: 2.93 L
FEV6-Pre: 2.84 L
FEV6FVC-%Change-Post: 0 %
FEV6FVC-%Pred-Post: 99 %
FEV6FVC-%Pred-Pre: 99 %
FVC-%Change-Post: 3 %
FVC-%Pred-Post: 69 %
FVC-%Pred-Pre: 67 %
FVC-Post: 3.13 L
FVC-Pre: 3.03 L
Post FEV1/FVC ratio: 41 %
Post FEV6/FVC ratio: 94 %
Pre FEV1/FVC ratio: 42 %
Pre FEV6/FVC Ratio: 94 %
RV % pred: 137 %
RV: 3.52 L
TLC % pred: 95 %
TLC: 6.93 L

## 2022-08-19 NOTE — Assessment & Plan Note (Signed)
Hypermetabolism on PET. He has follow up with urology next week to discuss next steps.

## 2022-08-19 NOTE — Progress Notes (Signed)
Full PFT performed today. °

## 2022-08-19 NOTE — Assessment & Plan Note (Signed)
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 He is compensated on current regimen with Trelegy. Action plan in place. Smoking cessation strongly advised.

## 2022-08-19 NOTE — Progress Notes (Signed)
@Patient  ID: Michael [name], male    DOB: [birth year], 72 y.o.   MRN: [id number]  Chief Complaint  Patient presents with   Follow-up    Pft review     Referring provider: Unk Pinto, MD  HPI: 72 year old male, active smoker followed for nodule of left upper lobe and severe emphysema. He is a patient of Dr. Juline Patch and last seen in office 07/09/2022 by [facility] NP. Past medical history significant for HTN, CAD, BPH, elevated PSA, vitamin d deficiency, HLD.   TEST/EVENTS:  06/04/2022 LDCT chest lung cancer screening: Atherosclerosis.  Multiple densely calcified mediastinal and hilar lymph nodes.  Innumerable small calcified and noncalcified pulmonary nodules scattered throughout the lungs bilaterally.  There is a new left upper lobe new nodule with diameter of 8.4 mm.  This has macrolobulated and slightly spiculated margins, concerning for small neoplasm.  Diffuse bronchial wall thickening with moderate centrilobular and paraseptal emphysema.  Extensive bilateral apical pleural-parenchymal thickening and nodular architectural distortion, similar to prior studies and most compatible with areas of chronic postinfectious or inflammatory scarring. 07/05/2022 PET scan: 3 small hypermetabolic left parotid nodules.  Left upper lobe pulmonary nodule of interest is hypermetabolic.  Gastric fundal mild hypermetabolism with SUV max of 5.3.  Right prostatic hypermetabolism.  Atherosclerosis.  Infrarenal aortic dilatation of 3.2 cm.  Recommend follow-up ultrasound every 3 years.  Atherosclerosis, emphysema and prostatomegaly are incidental findings. 07/27/2022 bronchoscopy: cytology neg for malignancy    06/24/2022: OV with Dr. Valeta Harms for initial consult.  Abnormal lung cancer screening CT completed on 06/04/2022.  Found to have innumerable small calcified and noncalcified pulmonary nodules that were again noted.  There is a new nodule in the left upper lobe, measuring 8.4 mm, which led to concern for small  neoplasm.  Has bilateral pleural parenchymal thickening and evidence of centrilobular and paraseptal emphysema.  Has a significant smoking history.  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  Currently managed with Trelegy.  Patient was set up for PET scan.  May be a candidate for surgical resection in the future; however, he is still smoking significantly.  Talked about smoking cessation options.  Started on Wellbutrin for this.  Ordered formal pulmonary function testing.  07/09/2022: OV with Ramandeep Arington NP for follow-up to discuss PET scan results.  The left upper lobe nodule of concern was noted to be hypermetabolic.  He was also found to have 3 small hypermetabolic left parotid nodules and hypermetabolism of the prostate.  He also had some mild gastric fundal hypermetabolism which was nonspecific.  Today, he tells me that he feels relatively unchanged compared to when he was here last.  He denies any hemoptysis, fevers, night sweats, weight loss.  He does not have any difficulties with swallowing or swelling in his neck.  He is followed by urology for elevated PSA and BPH.  His last check in October 2023 was 6.6 which was decreased from prior levels.  He has plans to see them again in April.  He is still smoking about a pack a day.  He started the Wellbutrin yesterday. Set up for bronchoscopy. Referred to ENT and GI. He will f/u with urology due to hypermetabolism in the prostate.  08/19/2022: Today - follow up Patient presents today for follow up. He had his bronchoscopy on 3/5; cytology without malignant cells. He had some granulomatous inflammation and pulmonary macrophages. He recovered well from his procedure. No hemoptysis or significant residual cough. He also had a  EGD with GI for evaluation of abnormal hypermetabolism on PET. Findings consistent with gastritis and H. Pylori. He has been on PPI and is supposed to be picking up an additional medication today; does not recall the name. He  has not heard from the ENT group regarding hypermetabolism of the parotid gland. He has an appointment with urology next week. He had PFTs today with severe obstruction and diffusion defect. No reversibility.  Today, he tells me that he feels relatively unchanged. He does have some shortness of breath on a day to day basis but overall, doesn't feel limited by his breathing. His Trelegy helps. No increased cough or chest congestion. Infrequent use of albuterol.   Allergies  Allergen Reactions   Ace Inhibitors Cough   Ciprofloxacin Other (See Comments)    Unknown   Fenofibrate     dizzy    Immunization History  Administered Date(s) Administered   PPD Test 12/25/2013   Pneumococcal Conjugate-13 02/28/2018   Pneumococcal-Unspecified 12/08/2010   Td 08/07/2021   Tdap 12/08/2010    Past Medical History:  Diagnosis Date   Arthritis    right wrist   BPH (benign prostatic hyperplasia)    COPD (chronic obstructive pulmonary disease) (HCC)    via CT lung   Dyspnea    GERD (gastroesophageal reflux disease)    yrs ago, no current problems, no meds as of 07/23/22   Hyperlipidemia    Hypertension    Hypogonadism male    Myocardial infarction [facility]) 2005   Per patient no stent or PTCA, just medical treatment   Pneumonia    x 1   Prediabetes    Vitamin D deficiency     Tobacco History: Social History   Tobacco Use  Smoking Status Every Day   Packs/day: 1.00   Years: 59.00   Additional pack years: 0.00   Total pack years: 59.00   Types: Cigarettes   Start date: 1965  Smokeless Tobacco Never   Ready to quit: Not Answered Counseling given: Not Answered   Outpatient Medications Prior to Visit  Medication Sig Dispense Refill   acetaminophen (TYLENOL) 500 MG tablet Take 1,000 mg by mouth at bedtime as needed for moderate pain or mild pain.     albuterol (VENTOLIN HFA) 108 (90 Base) MCG/ACT inhaler Inhale 1-2 puffs into the lungs every 6 (six) hours as needed for wheezing or  shortness of breath. 54 g 1   ascorbic acid (VITAMIN C) 500 MG tablet Take 1,000 mg by mouth daily.     aspirin EC 81 MG tablet Take 81 mg by mouth daily.     B Complex-C (SUPER B COMPLEX PO) Take 1 tablet by mouth daily.     bisoprolol-hydrochlorothiazide (ZIAC) 10-6.25 MG tablet TAKE 1 TABLET EVERY DAY FOR BLOOD PRESSURE (Patient taking differently: 0.5 tablets. TAKE 1 TABLET EVERY DAY FOR BLOOD PRESSURE) 90 tablet 3   buPROPion (WELLBUTRIN SR) 150 MG 12 hr tablet Take 1 tablet (150 mg total) by mouth 2 (two) times daily. Take one tab daily for 3 days then increase to twice daily (Patient taking differently: Take 150 mg by mouth daily.) 60 tablet 2   Cholecalciferol (VITAMIN D) 50 MCG (2000 UT) CAPS 4,000 Units daily.     Fluticasone-Umeclidin-Vilant (TRELEGY ELLIPTA) 100-62.5-25 MCG/ACT AEPB Use  1 Inhalation  Daily  for COPD / Asthma 180 each 3   Guaifenesin 1200 MG TB12 Take 1,200 mg by mouth 2 (two) times daily.     Magnesium 250 MG TABS Take  250 mg by mouth daily as needed (Constipation).     Multiple Vitamin (MULTIVITAMIN) tablet Take 1 tablet by mouth daily. Takes a vitamin pack daily     Omega-3 Fatty Acids (OMEGA 3 PO) Take 1 tablet by mouth daily. Takes Omega Red     omeprazole (PRILOSEC) 20 MG capsule Take 1 capsule (20 mg total) by mouth 2 (two) times daily before a meal. 70 capsule 0   saw palmetto 500 MG capsule Take 1,000 mg by mouth daily.     Facility-Administered Medications Prior to Visit  Medication Dose Route Frequency Provider Last Rate Last Admin   0.9 %  sodium chloride infusion  500 mL Intravenous Continuous Cirigliano, Vito V, DO         Review of Systems:   Constitutional: No weight loss or gain, night sweats, fevers, chills, fatigue, or lassitude. HEENT: No headaches, difficulty swallowing, tooth/dental problems, or sore throat. No sneezing, itching, ear ache, nasal congestion, or post nasal drip CV:  No chest pain, orthopnea, PND, swelling in lower extremities,  anasarca, dizziness, palpitations, syncope Resp: +shortness of breath with exertion (baseline). No excess mucus or change in color of mucus. No productive or non-productive. No hemoptysis. No wheezing.  No chest wall deformity GI:  No heartburn, indigestion, abdominal pain, nausea, vomiting, diarrhea, change in bowel habits, loss of appetite, bloody stools.  GU: No dysuria, change in color of urine, urgency or frequency.   Skin: No rash, lesions, ulcerations MSK:  No joint pain or swelling.   Neuro: No dizziness or lightheadedness.  Psych: No depression or anxiety. Mood stable.     Physical Exam:  BP 110/62   Pulse (!) 58   Temp 97.6 F (36.4 C)   Ht 5\' 10"  (1.778 m)   Wt 164 lb (74.4 kg)   SpO2 94%   BMI 23.53 kg/m   GEN: Pleasant, interactive, well-appearing; in no acute distress. HEENT:  Normocephalic and atraumatic. PERRLA. Sclera white. Nasal turbinates pink, moist and patent bilaterally. No rhinorrhea present. Oropharynx pink and moist, without exudate or edema. No lesions, ulcerations, or postnasal drip.  NECK:  Supple w/ fair ROM. No JVD present. Normal carotid impulses w/o bruits. Thyroid symmetrical with no goiter or nodules palpated. No lymphadenopathy.   CV: RRR, no m/r/g, no peripheral edema. Pulses intact, +2 bilaterally. No cyanosis, pallor or clubbing. PULMONARY:  Unlabored, regular breathing. Clear bilaterally A&P w/o wheezes/rales/rhonchi. No accessory muscle use.  GI: BS present and normoactive. Soft, non-tender to palpation. No organomegaly or masses detected.  MSK: No erythema, warmth or tenderness. Cap refil <2 sec all extrem. No deformities or joint swelling noted.  Neuro: A/Ox3. No focal deficits noted.   Skin: Warm, no lesions or rashe Psych: Normal affect and behavior. Judgement and thought content appropriate.     Lab Results:  CBC    Component Value Date/Time   WBC 8.0 07/27/2022 1110   RBC 4.56 07/27/2022 1110   HGB 14.3 07/27/2022 1110   HCT  43.4 07/27/2022 1110   PLT 136 (L) 07/27/2022 1110   MCV 95.2 07/27/2022 1110   MCH 31.4 07/27/2022 1110   MCHC 32.9 07/27/2022 1110   RDW 13.1 07/27/2022 1110   LYMPHSABS 2,093 05/18/2022 0000   MONOABS 768 11/08/2016 0854   EOSABS 120 05/18/2022 0000   BASOSABS 131 05/18/2022 0000    BMET    Component Value Date/Time   NA 137 07/27/2022 1110   K 4.0 07/27/2022 1110   CL 104 07/27/2022 1110  CO2 24 07/27/2022 1110   GLUCOSE 92 07/27/2022 1110   BUN 14 07/27/2022 1110   CREATININE 1.00 07/27/2022 1110   CREATININE 0.89 05/18/2022 0000   CALCIUM 9.1 07/27/2022 1110   GFRNONAA >60 07/27/2022 1110   GFRNONAA 80 08/07/2020 1112   GFRAA 93 08/07/2020 1112    BNP No results found for: "BNP"   Imaging:  DG Chest Port 1 View  Result Date: 07/27/2022 CLINICAL DATA:  Status post bronchoscopy with biopsy. EXAM: PORTABLE CHEST 1 VIEW COMPARISON:  Chest CT 06/04/2022 FINDINGS: Single-view of the chest was obtained. Negative for a pneumothorax. Innumerable small calcified granulomas throughout both lungs. There is a small clip in the left upper lung most likely representing a fiducial marker. Poorly defined opacity near the marker may represent the recently biopsied area and post biopsy changes. Heart size is normal. Trachea is midline. IMPRESSION: 1. Negative for pneumothorax following bronchoscopy and biopsy. 2. Evidence for old granulomatous disease. 3. Poorly defined opacity near the fiducial marker in the left upper lung. Electronically Signed   By: Markus Daft M.D.   On: 07/27/2022 14:16   DG C-ARM BRONCHOSCOPY  Result Date: 07/27/2022 C-ARM BRONCHOSCOPY: Fluoroscopy was utilized by the requesting physician.  No radiographic interpretation.         Latest Ref Rng & Units 08/19/2022    9:59 AM  PFT Results  FVC-Pre L 3.03  P  FVC-Predicted Pre % 67  P  FVC-Post L 3.13  P  FVC-Predicted Post % 69  P  Pre FEV1/FVC % % 42  P  Post FEV1/FCV % % 41  P  FEV1-Pre L 1.29  P   FEV1-Predicted Pre % 39  P  FEV1-Post L 1.30  P  DLCO uncorrected ml/min/mmHg 10.75  P  DLCO UNC% % 40  P  DLCO corrected ml/min/mmHg 10.84  P  DLCO COR %Predicted % 41  P  DLVA Predicted % 51  P  TLC L 6.93  P  TLC % Predicted % 95  P  RV % Predicted % 137  P    P Preliminary result    No results found for: "NITRICOXIDE"      Assessment & Plan:   Pulmonary nodules Hypermetabolic LUL pulmonary nodule. He had tissue sampling with cytology negative for malignancy. Possible this is inflammatory; however, needs to be monitored closely as it is still suspicious for a primary lung cancer. We will plan to repeat CT in 6 weeks for 3 month follow up to assess for growth. If this continues to grow, we would need to consider repeat biopsy. He would not be a surgical candidate due to FEV1 of 39%.   Patient Instructions  Your pulmonary function testing today was consistent with severe COPD  Continue Trelegy 1 puff daily. Brush tongue and rinse mouth afterwards Continue Albuterol inhaler 2 puffs every 6 hours as needed for shortness of breath or wheezing. Notify if symptoms persist despite rescue inhaler/neb use. You can try restarting wellbutrin 1 tablet daily for a total of 3 days then increase to 2 tablets daily. Monitor your mood. If you develop any depressed feelings or anxiety, stop and notify us. If the lightheadedness or low blood pressure happens again, stop and let me know   Work on quitting smoking   Will followed up on referral to Ear, Nose and Throat to evaluate the nodules on your parotid gland  Follow up with urology as scheduled  We will plan to repeat your CT in 6 weeks.  I will talk to Dr. Valeta Harms to make sure he did not have any other plans in the interim.   Follow up after CT chest mid May 2024 with Dr. Valeta Harms. If symptoms do not improve or worsen, please contact office for sooner follow up or seek emergency care.    COPD, severe ([place]) Severe COPD with FEV1 39%. He is  compensated on current regimen with Trelegy. Action plan in place. Smoking cessation strongly advised.   Prostate enlargement Hypermetabolism on PET. He has follow up with urology next week to discuss next steps.  Parotid nodule Hypermetabolic parotid nodule on PET. Awaiting appt with [facility]. Advised him to contact them to schedule appt for further evaluation.   Tobacco abuse He continues to smoke. We again reviewed smoking cessation. Time spent approx 4 min. He was worried his Wellbutrin had caused low BP readings so he stopped it. He has still had some intermittent dizziness and lower than usual readings since coming off so I do not think it was related. He will try restarting therapy and continue to work on quitting.  Regarding his BP, I advised him to keep a log at home for goal >100/60 and <140/90. Instructed him to hold his BP meds if he has SBP <100. Encouraged him to follow up with his PCP for medication adjustment, if appropriate.   Essential hypertension See above    I spent 38 minutes of dedicated to the care of this patient on the date of this encounter to include pre-visit review of records, face-to-face time with the patient discussing conditions above, post visit ordering of testing, clinical documentation with the electronic health record, making appropriate referrals as documented, and communicating necessary findings to members of the patients care team.  Clayton Bibles, NP 08/20/2022  Pt aware and understands NP's role.

## 2022-08-19 NOTE — Assessment & Plan Note (Addendum)
Hypermetabolic LUL pulmonary nodule. He had tissue sampling with cytology negative for malignancy. Possible this is a granuloma; however, needs to be monitored closely as he is high risk for a primary lung cancer. We will plan to repeat CT in 6 weeks for 3 month follow up to assess for growth. If this continues to grow, we would need to consider repeat biopsy. He would not be a surgical candidate due to FEV1 of 39%.   Patient Instructions  Your pulmonary function testing today was consistent with severe COPD  Continue Trelegy 1 puff daily. Brush tongue and rinse mouth afterwards Continue Albuterol inhaler 2 puffs every 6 hours as needed for shortness of breath or wheezing. Notify if symptoms persist despite rescue inhaler/neb use. You can try restarting wellbutrin 1 tablet daily for a total of 3 days then increase to 2 tablets daily. Monitor your mood. If you develop any depressed feelings or anxiety, stop and notify us. If the lightheadedness or low blood pressure happens again, stop and let me know   Work on quitting smoking   Will followed up on referral to Ear, Nose and Throat to evaluate the nodules on your parotid gland  Follow up with urology as scheduled  We will plan to repeat your CT in 6 weeks. I will talk to Dr. Valeta Harms to make sure he did not have any other plans in the interim.   Follow up after CT chest mid May 2024 with Dr. Valeta Harms. If symptoms do not improve or worsen, please contact office for sooner follow up or seek emergency care.

## 2022-08-19 NOTE — Patient Instructions (Signed)
Full PFT performed today. °

## 2022-08-19 NOTE — Patient Instructions (Addendum)
Your pulmonary function testing today was consistent with severe COPD  Continue Trelegy 1 puff daily. Brush tongue and rinse mouth afterwards Continue Albuterol inhaler 2 puffs every 6 hours as needed for shortness of breath or wheezing. Notify if symptoms persist despite rescue inhaler/neb use. You can try restarting wellbutrin 1 tablet daily for a total of 3 days then increase to 2 tablets daily. Monitor your mood. If you develop any depressed feelings or anxiety, stop and notify us. If the lightheadedness or low blood pressure happens again, stop and let me know   Work on quitting smoking   Will followed up on referral to Ear, Nose and Throat to evaluate the nodules on your parotid gland  Follow up with urology as scheduled  We will plan to repeat your CT in 6 weeks. I will talk to Dr. Valeta Harms to make sure he did not have any other plans in the interim.   Follow up after CT chest mid May 2024 with Dr. Valeta Harms. If symptoms do not improve or worsen, please contact office for sooner follow up or seek emergency care.

## 2022-08-19 NOTE — Assessment & Plan Note (Signed)
Hypermetabolic parotid nodule on PET. Awaiting appt with [facility]. Advised him to contact them to schedule appt for further evaluation.

## 2022-08-20 ENCOUNTER — Ambulatory Visit: Payer: Medicare PPO | Admitting: Nurse Practitioner

## 2022-08-20 ENCOUNTER — Encounter: Payer: Self-pay | Admitting: Nurse Practitioner

## 2022-08-20 NOTE — Assessment & Plan Note (Signed)
See above

## 2022-08-20 NOTE — Assessment & Plan Note (Addendum)
He continues to smoke. We again reviewed smoking cessation. Time spent approx 4 min. He was worried his Wellbutrin had caused low BP readings so he stopped it. He has still had some intermittent dizziness and lower than usual readings since coming off so I do not think it was related. He will try restarting therapy and continue to work on quitting.  Regarding his BP, I advised him to keep a log at home for goal >100/60 and <140/90. Instructed him to hold his BP meds if he has SBP <100. Encouraged him to follow up with his PCP for medication adjustment, if appropriate.

## 2022-08-25 NOTE — Progress Notes (Signed)
SG reviewed at office visit   Thanks,  [place], DO [facility] 08/25/2022 11:14 AM

## 2022-08-26 ENCOUNTER — Ambulatory Visit (INDEPENDENT_AMBULATORY_CARE_PROVIDER_SITE_OTHER): Payer: Medicare PPO | Admitting: Nurse Practitioner

## 2022-08-26 ENCOUNTER — Encounter: Payer: Self-pay | Admitting: Nurse Practitioner

## 2022-08-26 VITALS — BP 146/80 | HR 62 | Temp 97.8°F | Ht 70.0 in | Wt 166.2 lb

## 2022-08-26 DIAGNOSIS — J449 Chronic obstructive pulmonary disease, unspecified: Secondary | ICD-10-CM | POA: Diagnosis not present

## 2022-08-26 DIAGNOSIS — R6889 Other general symptoms and signs: Secondary | ICD-10-CM

## 2022-08-26 DIAGNOSIS — I2510 Atherosclerotic heart disease of native coronary artery without angina pectoris: Secondary | ICD-10-CM

## 2022-08-26 DIAGNOSIS — I10 Essential (primary) hypertension: Secondary | ICD-10-CM | POA: Diagnosis not present

## 2022-08-26 DIAGNOSIS — Z6823 Body mass index (BMI) 23.0-23.9, adult: Secondary | ICD-10-CM

## 2022-08-26 DIAGNOSIS — F17200 Nicotine dependence, unspecified, uncomplicated: Secondary | ICD-10-CM | POA: Diagnosis not present

## 2022-08-26 DIAGNOSIS — Z0001 Encounter for general adult medical examination with abnormal findings: Secondary | ICD-10-CM | POA: Diagnosis not present

## 2022-08-26 DIAGNOSIS — Z86010 Personal history of colonic polyps: Secondary | ICD-10-CM

## 2022-08-26 DIAGNOSIS — R972 Elevated prostate specific antigen [PSA]: Secondary | ICD-10-CM

## 2022-08-26 DIAGNOSIS — R918 Other nonspecific abnormal finding of lung field: Secondary | ICD-10-CM

## 2022-08-26 DIAGNOSIS — E782 Mixed hyperlipidemia: Secondary | ICD-10-CM

## 2022-08-26 DIAGNOSIS — N401 Enlarged prostate with lower urinary tract symptoms: Secondary | ICD-10-CM

## 2022-08-26 DIAGNOSIS — E559 Vitamin D deficiency, unspecified: Secondary | ICD-10-CM

## 2022-08-26 DIAGNOSIS — I700 Atherosclerosis of aorta: Secondary | ICD-10-CM

## 2022-08-26 DIAGNOSIS — Z0000 Encounter for general adult medical examination without abnormal findings: Secondary | ICD-10-CM

## 2022-08-26 DIAGNOSIS — N138 Other obstructive and reflux uropathy: Secondary | ICD-10-CM

## 2022-08-26 MED ORDER — BISOPROLOL-HYDROCHLOROTHIAZIDE 10-6.25 MG PO TABS: ORAL_TABLET | ORAL | 3 refills | Status: DC

## 2022-08-26 NOTE — Patient Instructions (Signed)
Smoking Tobacco Information, Adult Smoking tobacco can be harmful to your health. Tobacco contains a toxic colorless chemical called nicotine. Nicotine causes changes in your brain that make you want more and more. This is called addiction. This can make it hard to stop smoking once you start. Tobacco also has other toxic chemicals that can hurt your body and raise your risk of many cancers. Menthol or "lite" tobacco or cigarette brands are not safer than regular brands. How can smoking tobacco affect me? Smoking tobacco puts you at risk for: Cancer. Smoking is most commonly associated with lung cancer, but can also lead to cancer in other parts of the body. Chronic obstructive pulmonary disease (COPD). This is a long-term lung condition that makes it hard to breathe. It also gets worse over time. High blood pressure (hypertension), heart disease, stroke, heart attack, and lung infections, such as pneumonia. Cataracts. This is when the lenses in the eyes become clouded. Digestive problems. This may include peptic ulcers, heartburn, and gastroesophageal reflux disease (GERD). Oral health problems, such as gum disease, mouth sores, and tooth loss. Loss of taste and smell. Smoking also affects how you look and smell. Smoking may cause: Wrinkles. Yellow or stained teeth, fingers, and fingernails. Bad breath. Bad-smelling clothes and hair. Smoking tobacco can also affect your social life, because: It may be challenging to find places to smoke when away from home. Many workplaces, restaurants, hotels, and public places are tobacco-free. Smoking is expensive. This is due to the cost of tobacco and the long-term costs of treating health problems from smoking. Secondhand smoke may affect those around you. Secondhand smoke can cause lung cancer, breathing problems, and heart disease. Children of smokers have a higher risk for: Sudden infant death syndrome (SIDS). Ear infections. Lung infections. What  actions can I take to prevent health problems? Quit smoking  Do not start smoking. Quit if you already smoke. Do not replace cigarette smoking with vaping devices, such as e-cigarettes. Make a plan to quit smoking and commit to it. Look for programs to help you, and ask your health care provider for recommendations and ideas. Set a date and write down all the reasons you want to quit. Let your friends and family know you are quitting so they can help and support you. Consider finding friends who also want to quit. It can be easier to quit with someone else, so that you can support each other. Talk with your health care provider about using nicotine replacement medicines to help you quit. These include gum, lozenges, patches, sprays, or pills. If you try to quit but return to smoking, stay positive. It is common to slip up when you first quit, so take it one day at a time. Be prepared for cravings. When you feel the urge to smoke, chew gum or suck on hard candy. Lifestyle Stay busy. Take care of your body. Get plenty of exercise, eat a healthy diet, and drink plenty of water. Find ways to manage your stress, such as meditation, yoga, exercise, or time spent with friends and family. Ask your health care provider about having regular tests (screenings) to check for cancer. This may include blood tests, imaging tests, and other tests. Where to find support To get support to quit smoking, consider: Asking your health care provider for more information and resources. Joining a support group for people who want to quit smoking in your local community. There are many effective programs that may help you to quit. Calling the smokefree.gov counselor   helpline at 1-800-QUIT-NOW (1-800-784-8669). Where to find more information You may find more information about quitting smoking from: Centers for Disease Control and Prevention: cdc.gov/tobacco Smokefree.gov: smokefree.gov American Lung Association:  freedomfromsmoking.org Contact a health care provider if: You have problems breathing. Your lips, nose, or fingers turn blue. You have chest pain. You are coughing up blood. You feel like you will faint. You have other health changes that cause you to worry. Summary Smoking tobacco can negatively affect your health, the health of those around you, your finances, and your social life. Do not start smoking. Quit if you already smoke. If you need help quitting, ask your health care provider. Consider joining a support group for people in your local community who want to quit smoking. There are many effective programs that may help you to quit. This information is not intended to replace advice given to you by your health care provider. Make sure you discuss any questions you have with your health care provider. Document Revised: 05/05/2021 Document Reviewed: 05/05/2021 Elsevier Patient Education  2023 [employer]  

## 2022-08-26 NOTE — Progress Notes (Signed)
MEDICARE ANNUAL WELLNESS VISIT AND FOLLOW UP   Assessment:   Michael [name] was seen today for follow-up and medicare wellness.  Diagnoses and all orders for this visit:  Annual Medicare Wellness Visit Due annually  Health maintenance reviewed  Aortic atherosclerosis ([place]) Per CT 10/2019 and numerous others Discussed lifestyle modifications. Recommended diet heavy in fruits and veggies, omega 3's. Decrease consumption of animal meats, cheeses, and dairy products. Remain active and exercise as tolerated. Continue to monitor. Check lipids/TSH   Chronic obstructive pulmonary disease, unspecified COPD type (HCC)/Pulmonary nodules/Smoker Pulmonology following. Restart Wellbutrin Continue inhalers as directed. Continue Chest CT Screening as directed Quit smoking Smoking cessation instruction/counseling given:  counseled patient on the dangers of tobacco use, advised patient to stop smoking, and reviewed strategies to maximize success  Essential hypertension Above goal in clinic Continue Ziac Discussed DASH (Dietary Approaches to Stop Hypertension) DASH diet is lower in sodium than a typical American diet. Cut back on foods that are high in saturated fat, cholesterol, and trans fats. Eat more whole-grain foods, fish, poultry, and nuts Remain active and exercise as tolerated daily.  Monitor BP at home-Call if greater than 130/80 to schedule OV and discuss medication management. Check CMP/CBC  CAD Control blood pressure, cholesterol, glucose, increase exercise.  Monitor weight.  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 Kellie Simmering. Monitor annual PSA Continue to monitor  Vitamin D deficiency Continue supplement for goal of 60-100 Monitor Vitamin D levels  Hyperlipidemia, mixed Declines medications Discussed lifestyle modifications. Recommended diet heavy in fruits and veggies, omega 3's. Decrease consumption of  animal meats, cheeses, and dairy products. Remain active and exercise as tolerated. Continue to monitor. Check lipids/TSH - defers labs check today.   Re-check at next OV (3 mo).  BMI 23.0-23.9, adult Discussed appropriate BMI Diet modification. Physical activity. Encouraged/praised to build confidence.  History of colon polyps Colonoscopy UTD - 5 year follow up recommended by Dr. Audie Box due 12/2023 Discussed high fiber diet, staying well hydrated.  No orders of the defined types were placed in this encounter.   Notify office for further evaluation and treatment, questions or concerns if any reported s/s fail to improve.   The patient was advised to call back or seek an in-person evaluation if any symptoms worsen or if the condition fails to improve as anticipated.   Further disposition pending results of labs. Discussed med's effects and SE's.    I discussed the assessment and treatment plan with the patient. The patient was provided an opportunity to ask questions and all were answered. The patient agreed with the plan and demonstrated an understanding of the instructions.  Discussed med's effects and SE's. Screening labs and tests as requested with regular follow-up as recommended.  I provided 35 minutes of face-to-face time during this encounter including counseling, chart review, and critical decision making was preformed.  Today's Plan of Care is based on a patient-centered health care approach known as shared decision making - the decisions, tests and treatments allow for patient preferences and values to be balanced with clinical evidence.     Future Appointments  Date Time Provider [place]  09/10/2022 10:50 AM McKenzie, Candee Furbish, MD [facility] None  10/01/2022 12:00 PM GI-315 CT 1 GI-315CT [facility]  12/02/2022 10:30 AM Unk Pinto, MD [facility] None  03/04/2023 10:30 AM Darrol Jump, NP [facility] None  06/09/2023 10:00 AM Unk Pinto, MD  [facility] None     Plan:  During the course of the visit the patient was educated and counseled about appropriate screening and preventive services including:   Pneumococcal vaccine  Influenza vaccine Prevnar 13 Td vaccine Screening electrocardiogram Colorectal cancer screening Diabetes screening Glaucoma screening Nutrition counseling    Subjective:  Michael [name] is a 72 y.o. male who presents for Medicare Annual Wellness Visit and 3 month follow up for HTN,HLD, glucose management, and vitamin D Def.   Overall he reports feeling well.  He has no new issues to discuss in clinic today.  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 Enjoys his job.  Lives alone.  Has son in [place] who is helping through a "hard time."    Had updated chest CT for lung screening that confirmed lung nodules.  He now follows with Pulmonology [facility], last seen 08/19/22 for nodule of left upper lobe and severe emphysema. He is a patient of Dr. Juline Patch. Associated COPD.  Uses Trelegy and Albuterol.  He had his bronchoscopy on 3/5; cytology without malignant cells. He had some granulomatous inflammation and pulmonary macrophages. He recovered well from his procedure. No hemoptysis or significant residual cough.   He had PFTs completed 07/27/22 with severe obstruction and diffusion defect. No reversibility.   Per Pulmonology [id number]:  Hypermetabolic LUL pulmonary nodule. He had tissue sampling with cytology negative for malignancy. Possible this is a granuloma; however, needs to be monitored closely as he is high risk for a primary lung cancer. We will plan to repeat CT in 6 weeks for 3 month follow up to assess for growth. If this continues to grow, we would need to consider repeat biopsy. He would not be a surgical candidate due to FEV1 of 39%.   He currently continues to smoke 1 pack a day;  He was started on Wellbutrin but felt it was dropping his blood pressure.  He discussed symptoms with  Cardiologist and plans to restart.   He also had a EGD with GI for evaluation of abnormal hypermetabolism on PET. Findings consistent with gastritis and H. Pylori. He has been on PPI.    He has not heard from the ENT group regarding hypermetabolism of the parotid gland. He will continue to follow up on this.   He has an appointment with urology next week.   BMI is Body mass index is 23.85 kg/m., he has been working on diet and exercise, still works, Dealer and farm work, very active.  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 He is established with 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 Recent CTs have demonstrated aortic atherosclerosis and 3 vessel CAD.   His blood pressure has been controlled at home, today their BP is BP: (!) 146/80 He does workout. He denies chest pain, dizziness. More exertional dyspnea in the last year with brisk walking or heaving lifting.   BP Readings from Last 3 Encounters:  08/26/22 (!) 146/80  08/19/22 110/62  08/13/22 124/72    He is not on cholesterol medication (rosuvastatin 10 mg was prescribed but never started, extended discussion today, denies hx of intolerance) His cholesterol is not at goal. The cholesterol last  visit was:   Lab Results  Component Value Date   CHOL 252 (H) 05/18/2022   HDL 55 05/18/2022   LDLCALC 156 (H) 05/18/2022   TRIG 242 (H) 05/18/2022   CHOLHDL 4.6 05/18/2022   He has been working on diet and exercise for glucose management, and denies increased appetite, nausea, paresthesia of the feet, polydipsia, polyuria and visual disturbances. Last A1C in the office was:  Lab Results  Component Value Date   HGBA1C 5.9 (H) 05/18/2022   Last GFR Lab  Results  Component Value Date   GFRNONAA >60 07/27/2022   Patient is on Vitamin D supplement.   Lab Results  Component Value Date   VD25OH 40 05/18/2022       He is previously established with [name] for BPH/elevated PSAs which have been stable- negative biopsy x3 in the past, now taking saw palmetto with improvement but patient notes increasing slow stream, req referral back  Lab Results  Component Value Date   PSA 7.22 (H) 05/04/2021   PSA 7.14 (H) 05/01/2020   PSA 5.6 (H) 03/13/2019     Medication Review:    Current Outpatient Medications (Cardiovascular):    bisoprolol-hydrochlorothiazide (ZIAC) 10-6.25 MG tablet, TAKE 1 TABLET EVERY DAY FOR BLOOD PRESSURE   Current Outpatient Medications (Respiratory):    albuterol (VENTOLIN HFA) 108 (90 Base) MCG/ACT inhaler, Inhale 1-2 puffs into the lungs every 6 (six) hours as needed for wheezing or shortness of breath.   Fluticasone-Umeclidin-Vilant (TRELEGY ELLIPTA) 100-62.5-25 MCG/ACT AEPB, Use  1 Inhalation  Daily  for COPD / Asthma   Guaifenesin 1200 MG TB12, Take 1,200 mg by mouth 2 (two) times daily.   Current Outpatient Medications (Analgesics):    acetaminophen (TYLENOL) 500 MG tablet, Take 1,000 mg by mouth at bedtime as needed for moderate pain or mild pain.   aspirin EC 81 MG tablet, Take 81 mg by mouth daily.     Current Outpatient Medications (Other):    ascorbic acid (VITAMIN C) 500 MG tablet, Take 1,000 mg by mouth daily.   B Complex-C (SUPER B COMPLEX PO), Take 1 tablet by mouth daily.   buPROPion (WELLBUTRIN SR) 150 MG 12 hr tablet, Take 1 tablet (150 mg total) by mouth 2 (two) times daily. Take one tab daily for 3 days then increase to twice daily (Patient taking differently: Take 150 mg by mouth daily.)   Cholecalciferol (VITAMIN D) 50 MCG (2000 UT) CAPS, 4,000 Units daily.   Magnesium 250 MG TABS, Take 250 mg by mouth daily as needed (Constipation).   Multiple Vitamin (MULTIVITAMIN) tablet, Take 1 tablet by  mouth daily. Takes a vitamin pack daily   Omega-3 Fatty Acids (OMEGA 3 PO), Take 1 tablet by mouth daily. Takes Omega Red   saw palmetto 500 MG capsule, Take 1,000 mg by mouth daily.   omeprazole (PRILOSEC) 20 MG capsule, Take 1 capsule (20 mg total) by mouth 2 (two) times daily before a meal. (Patient not taking: Reported on 08/26/2022)  Current Facility-Administered Medications (Other):    0.9 %  sodium chloride infusion  Allergies: Allergies  Allergen Reactions   Ace Inhibitors Cough   Ciprofloxacin Other (See Comments)    Unknown   Fenofibrate     dizzy    Current Problems (verified) has BPH with obstruction/lower urinary tract symptoms; Vitamin D deficiency; COPD, severe; Essential hypertension; Hyperlipidemia, mixed; Abnormal glucose; Tobacco abuse; CAD (coronary artery disease); History of amputation of finger of left hand; Elevated PSA; Aortic atherosclerosis ([place]) by Chest  CT scan on 10/27/2020; FHx: heart disease; Pulmonary nodules; History of colonic polyps (hyperplastic x 3 in 2014); Screening for colorectal cancer; Prostate enlargement; Parotid nodule; Infrarenal abdominal aortic aneurysm (AAA) without rupture; and Abnormal gastrointestinal PET scan on their problem list.  Screening Tests Immunization History  Administered Date(s) Administered   PPD Test 12/25/2013   Pneumococcal Conjugate-13 02/28/2018   Pneumococcal-Unspecified 12/08/2010   Td 08/07/2021   Tdap 12/08/2010    Preventative care: Last colonoscopy: 12/2018, polyps, Dr. 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  [facility] for [facility] 2. Dr. Nicki Reaper, eye doctor, last visit 06/2021 3. Dentist, last visit remote, has top dentures, 09/2021  Patient Care Team: Unk Pinto, MD as PCP - General (Internal Medicine) Lowella Bandy, MD (Inactive) as Consulting Physician (Urology) Carleene Mains, [facility] as Pharmacist (Pharmacist)  Surgical: He  has a past surgical history that includes Prostate surgery; Amputation finger / thumb (Left, 1991); Vasectomy; colonoscopy (N/A, 10/2012); Cardiac catheterization (N/A, 2005); Cataract extraction, bilateral (Bilateral, 2014); Video bronchoscopy with endobronchial ultrasound (Left, 07/27/2022); Bronchial needle aspiration biopsy (07/27/2022); Bronchial biopsy (07/27/2022); Bronchial brushings (07/27/2022); and Fiducial marker placement (07/27/2022). Family His family history includes Cancer in his maternal aunt, maternal uncle, and maternal uncle; Cancer (age of onset: 65) in his father; Heart failure in his maternal grandfather; Liver disease in his paternal grandfather; Other in his son; Pneumonia in his paternal grandmother; Stroke in his maternal grandmother; Stroke (age of onset: 79) in his son. Social history  He reports that he has been smoking cigarettes. He started smoking about 59 years ago. He has a 59.00 pack-year smoking history. He has never used smokeless tobacco. He reports that he does not currently use alcohol after a past usage of about 5.0 standard drinks of alcohol per week. He reports that he does not use drugs.  MEDICARE WELLNESS OBJECTIVES: Physical activity:   Cardiac risk factors:   Depression/mood screen:      08/26/2022   11:28 AM  Depression screen PHQ 2/9  Decreased Interest 0  Down, Depressed, Hopeless 0  PHQ - 2 Score 0    ADLs:     08/26/2022   11:20 AM 05/18/2022    7:08 PM  In your present state of health, do you have any difficulty performing the following activities:  Hearing? 0 0  Vision? 0 0  Difficulty concentrating or making decisions? 0 0  Walking or climbing stairs? 0 0  Dressing or bathing?  0  Doing errands, shopping? 0 0  Preparing Food and eating ? N   Using the Toilet? N   In  the past six months, have you accidently leaked urine? N   Do you have problems with loss of bowel control? N   Managing your Medications? N   Managing your Finances? N   Housekeeping or managing your Housekeeping? N      Cognitive Testing  Alert? Yes  Normal Appearance?Yes  Oriented to person? Yes  Place? Yes   Time? Yes  Recall of three objects?  Yes  Can perform simple calculations? Yes  Displays appropriate judgment?Yes  Can read the correct time from a watch face?Yes  EOL planning:     Objective:   Today's Vitals   08/26/22 1053  BP: (!) 146/80  Pulse: 62  Temp:  97.8 F (36.6 C)  SpO2: 98%  Weight: 166 lb 3.2 oz (75.4 kg)  Height: 5\' 10"  (1.778 m)   Body mass index is 23.85 kg/m.  General appearance: alert, no distress, WD/WN, male HEENT: normocephalic, sclerae anicteric, TMs pearly, nares patent, no discharge or erythema, pharynx normal Oral cavity: MMM, no lesions Neck: supple, no lymphadenopathy, no thyromegaly, no masses Heart: RRR, normal S1, S2, no murmurs Lungs: CTA bilaterally, no wheezes, rhonchi, or rales Abdomen: +Bs, soft, non tender, non distended, no masses, no hepatomegaly, no splenomegaly Musculoskeletal: nontender, no swelling, left 2nd and 3rd digits with distal phlange amputated (remotely, well healed) Extremities: no edema, no cyanosis, no clubbing Pulses: 2+ symmetric, upper and lower extremities, normal cap refill Neurological: alert, oriented x 3, CN2-12 intact, strength normal upper extremities and lower extremities, sensation normal throughout, DTRs 2+ throughout, no cerebellar signs, gait normal Psychiatric: normal affect, behavior normal, pleasant   Darrol Jump, NP 11:28 AM [facility]

## 2022-09-02 ENCOUNTER — Telehealth: Payer: Self-pay

## 2022-09-02 DIAGNOSIS — B9681 Helicobacter pylori [H. pylori] as the cause of diseases classified elsewhere: Secondary | ICD-10-CM

## 2022-09-02 MED ORDER — BISMUTH SUBSALICYLATE 262 MG PO CHEW: 524.0000 mg | CHEWABLE_TABLET | Freq: Four times a day (QID) | ORAL | 0 refills | Status: AC

## 2022-09-02 MED ORDER — DOXYCYCLINE HYCLATE 100 MG PO TABS: 100.0000 mg | ORAL_TABLET | Freq: Two times a day (BID) | ORAL | 0 refills | Status: AC

## 2022-09-02 MED ORDER — METRONIDAZOLE 250 MG PO TABS: 250.0000 mg | ORAL_TABLET | Freq: Two times a day (BID) | ORAL | 0 refills | Status: AC

## 2022-09-02 NOTE — Telephone Encounter (Signed)
Patient made aware. Informed him we sent 2 antibiotics and pepcid to his pharmacy. Advised him to take Omeprazole as prescribed with all 3 meds for 14 days.   Advised him 4 weeks after treatment completed, we need to check H. Pylori stool test to confirm eradication. And  He must be off Omeprazole.   Informed him I will call him in 6 weeks to remind him that he is due for stool test. Advised him to call us if he has any questions. Patient verbalized understanding. Sent myself a reminder as well.

## 2022-09-02 NOTE — Telephone Encounter (Signed)
-----   Message from Madison V, DO sent at 09/02/2022  1:30 PM EDT ----- The biopsies from the recent upper GI Endoscopy were notable for H. Pylori gastritis, and will plan on treating with quad therapy as below. Please confirm no medication allergies to the prescribed regimen.   1) Continue Omeprazole as prescribed at the time of the recent upper endoscopy 2) Pepto Bismol 2 tabs (262 mg each) 4 times a day x 14 d 3) Metronidazole 250 mg 4 times a day x 14 d 4) doxycycline 100 mg 2 times a day x 14 d  4 weeks after treatment completed, check H. Pylori stool antigen to confirm eradication (must be off acid suppression therapy)  Dx: H. Pylori gastritis

## 2022-09-02 NOTE — Addendum Note (Signed)
Addended by: Coletta Memos on: 09/02/2022 02:35 PM   Modules accepted: Orders

## 2022-09-08 ENCOUNTER — Ambulatory Visit: Payer: Medicare PPO | Admitting: Urology

## 2022-09-08 DIAGNOSIS — F17210 Nicotine dependence, cigarettes, uncomplicated: Secondary | ICD-10-CM | POA: Diagnosis not present

## 2022-09-08 DIAGNOSIS — H9313 Tinnitus, bilateral: Secondary | ICD-10-CM | POA: Diagnosis not present

## 2022-09-08 DIAGNOSIS — K118 Other diseases of salivary glands: Secondary | ICD-10-CM | POA: Diagnosis not present

## 2022-09-09 ENCOUNTER — Other Ambulatory Visit: Payer: Self-pay | Admitting: Otolaryngology

## 2022-09-09 DIAGNOSIS — K118 Other diseases of salivary glands: Secondary | ICD-10-CM

## 2022-09-10 ENCOUNTER — Ambulatory Visit (INDEPENDENT_AMBULATORY_CARE_PROVIDER_SITE_OTHER): Payer: Medicare PPO | Admitting: Urology

## 2022-09-10 ENCOUNTER — Encounter: Payer: Self-pay | Admitting: Urology

## 2022-09-10 VITALS — BP 120/68 | HR 61

## 2022-09-10 DIAGNOSIS — R972 Elevated prostate specific antigen [PSA]: Secondary | ICD-10-CM

## 2022-09-10 DIAGNOSIS — N138 Other obstructive and reflux uropathy: Secondary | ICD-10-CM

## 2022-09-10 DIAGNOSIS — N401 Enlarged prostate with lower urinary tract symptoms: Secondary | ICD-10-CM

## 2022-09-10 LAB — URINALYSIS, ROUTINE W REFLEX MICROSCOPIC
Bilirubin, UA: NEGATIVE
Glucose, UA: NEGATIVE
Ketones, UA: NEGATIVE
Leukocytes,UA: NEGATIVE
Nitrite, UA: NEGATIVE
Protein,UA: NEGATIVE
RBC, UA: NEGATIVE
Specific Gravity, UA: 1.015 (ref 1.005–1.030)
Urobilinogen, Ur: 0.2 mg/dL (ref 0.2–1.0)
pH, UA: 6 (ref 5.0–7.5)

## 2022-09-10 NOTE — Progress Notes (Unsigned)
09/10/2022 11:21 AM   Michael [name] Sep 12, 1950 [id number]  Referring provider: Lucky Cowboy, MD [address] [place],  [place] [address]  BPH and elevated PSA   HPI: [name] is a 72yo here for followup for BPH and elevated PSA. No recent PSA. PSA was 6.6 in 10/23. He has a hx of 3 negative biopsies when he saw Dr. Brunilda Payor at [facility]. IPSS 11 QOl 3 on saw palmetto. Nocturia 2x. He has occasional urgency. No straining to urinate. Urine stream fair.  He underwent PET in 06/2022 which showed a small area of enhancement on the right prostate lobe. No other complaints today.   PMH: Past Medical History:  Diagnosis Date   Arthritis    right wrist   BPH (benign prostatic hyperplasia)    COPD (chronic obstructive pulmonary disease)    via CT lung   Dyspnea    GERD (gastroesophageal reflux disease)    yrs ago, no current problems, no meds as of 07/23/22   Hyperlipidemia    Hypertension    Hypogonadism male    Myocardial infarction 2005   Per patient no stent or PTCA, just medical treatment   Pneumonia    x 1   Prediabetes    Vitamin D deficiency     Surgical History: Past Surgical History:  Procedure Laterality Date   AMPUTATION FINGER / THUMB Left 1991   distal index   BRONCHIAL BIOPSY  07/27/2022   Procedure: BRONCHIAL BIOPSIES;  Surgeon: Josephine Igo, DO;  Location: [facility];  Service: Pulmonary;;   BRONCHIAL BRUSHINGS  07/27/2022   Procedure: BRONCHIAL BRUSHINGS;  Surgeon: Josephine Igo, DO;  Location: [facility];  Service: Pulmonary;;   BRONCHIAL NEEDLE ASPIRATION BIOPSY  07/27/2022   Procedure: BRONCHIAL NEEDLE ASPIRATION BIOPSIES;  Surgeon: Josephine Igo, DO;  Location: [facility];  Service: Pulmonary;;   CARDIAC CATHETERIZATION N/A 2005   Per patient no stent or PTCA, just medical treatment   CATARACT EXTRACTION, BILATERAL Bilateral 2014   Dr. Vonna Kotyk    colonoscopy N/A 10/2012   Neg and recc f/u Cololnoscopy in 5 yeary -  Dr Michael [name] - Gastroenterologist   FIDUCIAL MARKER PLACEMENT  07/27/2022   Procedure: FIDUCIAL MARKER PLACEMENT;  Surgeon: Josephine Igo, DO;  Location: [facility];  Service: Pulmonary;;   PROSTATE SURGERY     Biopsy X 3   VASECTOMY     VIDEO BRONCHOSCOPY WITH ENDOBRONCHIAL ULTRASOUND Left 07/27/2022   Procedure: VIDEO BRONCHOSCOPY WITH ENDOBRONCHIAL ULTRASOUND;  Surgeon: Josephine Igo, DO;  Location: [facility];  Service: Pulmonary;  Laterality: Left;    Home Medications:  Allergies as of 09/10/2022       Reactions   Ace Inhibitors Cough   Ciprofloxacin Other (See Comments)   Unknown   Fenofibrate    dizzy        Medication List        Accurate as of September 10, 2022 11:21 AM. If you have any questions, ask your nurse or doctor.          acetaminophen 500 MG tablet Commonly known as: TYLENOL Take 1,000 mg by mouth at bedtime as needed for moderate pain or mild pain.   albuterol 108 (90 Base) MCG/ACT inhaler Commonly known as: VENTOLIN HFA Inhale 1-2 puffs into the lungs every 6 (six) hours as needed for wheezing or shortness of breath.   ascorbic acid 500 MG tablet Commonly known as: VITAMIN C Take 1,000 mg by mouth daily.  aspirin EC 81 MG tablet Take 81 mg by mouth daily.   bismuth subsalicylate 262 MG chewable tablet Commonly known as: Pepto-Bismol Chew 2 tablets (524 mg total) by mouth 4 (four) times daily for 14 days.   bisoprolol-hydrochlorothiazide 10-6.25 MG tablet Commonly known as: ZIAC TAKE 1 TABLET EVERY DAY FOR BLOOD PRESSURE   buPROPion 150 MG 12 hr tablet Commonly known as: Wellbutrin SR Take 1 tablet (150 mg total) by mouth 2 (two) times daily. Take one tab daily for 3 days then increase to twice daily What changed:  when to take this additional instructions   doxycycline 100 MG tablet Commonly known as: VIBRA-TABS Take 1 tablet (100 mg total) by mouth 2 (two) times daily for 14 days.   Guaifenesin 1200 MG Tb12 Take 1,200  mg by mouth 2 (two) times daily.   Magnesium 250 MG Tabs Take 250 mg by mouth daily as needed (Constipation).   metroNIDAZOLE 250 MG tablet Commonly known as: FLAGYL Take 1 tablet (250 mg total) by mouth 2 (two) times daily for 14 days.   multivitamin tablet Take 1 tablet by mouth daily. Takes a vitamin pack daily   OMEGA 3 PO Take 1 tablet by mouth daily. Takes Omega Red   saw palmetto 500 MG capsule Take 1,000 mg by mouth daily.   SUPER B COMPLEX PO Take 1 tablet by mouth daily.   Trelegy Ellipta 100-62.5-25 MCG/ACT Aepb Generic drug: Fluticasone-Umeclidin-Vilant Use  1 Inhalation  Daily  for COPD / Asthma   Vitamin D 50 MCG (2000 UT) Caps 4,000 Units daily.        Allergies:  Allergies  Allergen Reactions   Ace Inhibitors Cough   Ciprofloxacin Other (See Comments)    Unknown   Fenofibrate     dizzy    Family History: Family History  Problem Relation Age of Onset   Cancer Father 34       lymphoma   Cancer Maternal Aunt        Lung   Cancer Maternal Uncle        Lung   Cancer Maternal Uncle        Lung   Stroke Maternal Grandmother    Heart failure Maternal Grandfather    Pneumonia Paternal Grandmother    Liver disease Paternal Grandfather    Stroke Son 72   Other Son        internal intestinal hemorage   Esophageal cancer Neg Hx    Stomach cancer Neg Hx     Social History:  reports that he has been smoking cigarettes. He started smoking about 59 years ago. He has a 59.00 pack-year smoking history. He has never used smokeless tobacco. He reports that he does not currently use alcohol after a past usage of about 5.0 standard drinks of alcohol per week. He reports that he does not use drugs.  ROS: All other review of systems were reviewed and are negative except what is noted above in HPI  Physical Exam: BP 120/68   Pulse 61   Constitutional:  Alert and oriented, No acute distress. HEENT: [place] AT, moist mucus membranes.  Trachea midline, no  masses. Cardiovascular: No clubbing, cyanosis, or edema. Respiratory: Normal respiratory effort, no increased work of breathing. GI: Abdomen is soft, nontender, nondistended, no abdominal masses GU: No CVA tenderness.  Lymph: No cervical or inguinal lymphadenopathy. Skin: No rashes, bruises or suspicious lesions. Neurologic: Grossly intact, no focal deficits, moving all 4 extremities. Psychiatric: Normal mood and affect.  Laboratory Data: Lab Results  Component Value Date   WBC 8.0 07/27/2022   HGB 14.3 07/27/2022   HCT 43.4 07/27/2022   MCV 95.2 07/27/2022   PLT 136 (L) 07/27/2022    Lab Results  Component Value Date   CREATININE 1.00 07/27/2022    Lab Results  Component Value Date   PSA 7.22 (H) 05/04/2021   PSA 7.14 (H) 05/01/2020   PSA 5.6 (H) 03/13/2019    Lab Results  Component Value Date   TESTOSTERONE 157 (L) 02/28/2018    Lab Results  Component Value Date   HGBA1C 5.9 (H) 05/18/2022    Urinalysis    Component Value Date/Time   COLORURINE YELLOW 05/18/2022 0000   APPEARANCEUR CLEAR 05/18/2022 0000   APPEARANCEUR Clear 08/27/2021 1423   LABSPEC 1.008 05/18/2022 0000   PHURINE 7.0 05/18/2022 0000   GLUCOSEU NEGATIVE 05/18/2022 0000   HGBUR NEGATIVE 05/18/2022 0000   BILIRUBINUR Negative 08/27/2021 1423   KETONESUR NEGATIVE 05/18/2022 0000   PROTEINUR NEGATIVE 05/18/2022 0000   NITRITE NEGATIVE 05/18/2022 0000   LEUKOCYTESUR NEGATIVE 05/18/2022 0000    Lab Results  Component Value Date   LABMICR Comment 08/27/2021   BACTERIA NONE SEEN 12/25/2013    Pertinent Imaging: *** No results found for this or any previous visit.  No results found for this or any previous visit.  No results found for this or any previous visit.  No results found for this or any previous visit.  No results found for this or any previous visit.  No valid procedures specified. No results found for this or any previous visit.  No results found for this or any  previous visit.   Assessment & Plan:    1. BPH with obstruction/lower urinary tract symptoms *** - Urinalysis, Routine w reflex microscopic  2. Elevated PSA *** - Urinalysis, Routine w reflex microscopic   No follow-ups on file.  Wilkie Aye, MD  [facility] [place]

## 2022-09-10 NOTE — Patient Instructions (Signed)

## 2022-09-11 LAB — PSA, TOTAL AND FREE
PSA, Free Pct: 16.2 %
PSA, Free: 1.38 ng/mL
Prostate Specific Ag, Serum: 8.5 ng/mL — ABNORMAL HIGH (ref 0.0–4.0)

## 2022-09-16 ENCOUNTER — Telehealth: Payer: Self-pay

## 2022-09-16 NOTE — Telephone Encounter (Signed)
-----   Message from Malen Gauze, MD sent at 09/14/2022  9:38 AM EDT ----- PSA increased to 8.5. Patient needs prostate biopsy ----- Message ----- From: Gustavus Messing, LPN Sent: 1/61/0960   8:50 PM EDT To: Malen Gauze, MD  Please review

## 2022-09-16 NOTE — Telephone Encounter (Signed)
Patient called with no answer. Detailed message left with a request to return call to office.

## 2022-09-20 DIAGNOSIS — H903 Sensorineural hearing loss, bilateral: Secondary | ICD-10-CM | POA: Diagnosis not present

## 2022-09-21 NOTE — Telephone Encounter (Signed)
-----   Message from Patrick L McKenzie, MD sent at 09/14/2022  9:38 AM EDT ----- PSA increased to 8.5. Patient needs prostate biopsy ----- Message ----- From: Arshia Spellman, LPN Sent: 09/12/2022   8:50 PM EDT To: Patrick L McKenzie, MD  Please review  

## 2022-09-21 NOTE — Telephone Encounter (Signed)
Patient called for 2nd time with no answer. Detailed message left with request to return call to office.

## 2022-10-01 ENCOUNTER — Ambulatory Visit
Admission: RE | Admit: 2022-10-01 | Discharge: 2022-10-01 | Disposition: A | Discharge status: Home / Self Care | Payer: Medicare PPO | Source: Ambulatory Visit | Attending: Otolaryngology | Admitting: Otolaryngology

## 2022-10-01 ENCOUNTER — Ambulatory Visit
Admission: RE | Admit: 2022-10-01 | Discharge: 2022-10-01 | Disposition: A | Discharge status: Home / Self Care | Payer: Medicare PPO | Source: Ambulatory Visit | Attending: Nurse Practitioner | Admitting: Nurse Practitioner

## 2022-10-01 DIAGNOSIS — J439 Emphysema, unspecified: Secondary | ICD-10-CM | POA: Diagnosis not present

## 2022-10-01 DIAGNOSIS — R059 Cough, unspecified: Secondary | ICD-10-CM | POA: Diagnosis not present

## 2022-10-01 DIAGNOSIS — K118 Other diseases of salivary glands: Secondary | ICD-10-CM

## 2022-10-01 DIAGNOSIS — R911 Solitary pulmonary nodule: Secondary | ICD-10-CM

## 2022-10-01 DIAGNOSIS — R110 Nausea: Secondary | ICD-10-CM | POA: Diagnosis not present

## 2022-10-01 DIAGNOSIS — R0602 Shortness of breath: Secondary | ICD-10-CM | POA: Diagnosis not present

## 2022-10-01 DIAGNOSIS — D110 Benign neoplasm of parotid gland: Secondary | ICD-10-CM | POA: Diagnosis not present

## 2022-10-01 DIAGNOSIS — I700 Atherosclerosis of aorta: Secondary | ICD-10-CM | POA: Diagnosis not present

## 2022-10-01 MED ORDER — IOPAMIDOL (ISOVUE-300) INJECTION 61%
75.0000 mL | Freq: Once | INTRAVENOUS | Status: AC | PRN
  Administered 2022-10-01: 75 mL via INTRAVENOUS

## 2022-10-07 NOTE — Telephone Encounter (Signed)
-----   Message from Patrick L McKenzie, MD sent at 09/14/2022  9:38 AM EDT ----- PSA increased to 8.5. Patient needs prostate biopsy ----- Message ----- From: Pax Reasoner, LPN Sent: 09/12/2022   8:50 PM EDT To: Patrick L McKenzie, MD  Please review  

## 2022-10-07 NOTE — Telephone Encounter (Signed)
Called and spoke with patients wife. Made her aware of patients elevated psa and Dr. Dimas Millin recommendations to have a prostate biopsy done.  Wife states that patient was not at home and she will have him call the office back.

## 2022-10-11 ENCOUNTER — Other Ambulatory Visit: Payer: Self-pay | Admitting: Nurse Practitioner

## 2022-10-11 DIAGNOSIS — R911 Solitary pulmonary nodule: Secondary | ICD-10-CM

## 2022-10-11 NOTE — Progress Notes (Signed)
Spoke with pt. Possible granuloma but given smoking history, will plan to repeat CT chest in 3 months. If nodule has enlarged, would consider repeat biopsy at that time. Plan to follow up with Dr. Tonia Brooms mid to end of August to discuss results/next steps.

## 2022-10-11 NOTE — Telephone Encounter (Signed)
Spoke with patient to remind him that he is due for H. Pylori stool test to confirm eradication. Informed him test is already ordered and he can come to our lab downstairs at his convenience. Patient was not sure when he stopped Omeprazole when asked. Stated he is outside and will call me back later today.

## 2022-10-11 NOTE — Progress Notes (Signed)
Letter sent.

## 2022-10-12 ENCOUNTER — Telehealth: Payer: Self-pay | Admitting: *Deleted

## 2022-10-12 NOTE — Telephone Encounter (Signed)
Michael [name] 10/11/2022 at 9:53 AM [facility] focus nurse outreach prep started. Reviewing office visits, specialist's visits, hospital visits, adherence, medications, and labs. Pt confirmed phone visit, for closer to noon if possible. ( )  Last care plan: 03/2022  08/19/2022- Cobb (pulmonology)- restarted Wellbutrin once a day for 3 days then increase to2 tablets daily.  09/02/2022- [place] Foil Laurette Schimke)- started bismuth subsalicylate 524mg  QID, started doxycycline hyclate 100mg  BID, started metronidazole 250mg  BID. No ED visits or hospitalizations.  Date visit completed: 10/12/2022 Type of visit:: Phone Visit Details:: 1. Lung nodule-biopsy performed is benign. 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 He denies shortness of breath. He does have a productive cough, no more than usual. Phlegm is clear. Mucinex is effective. He continues to take his Trelegy Ellipta daily and Albuterol inhaler twice daily.   2. HTN: Reports blood pressures have been stable. Denies chest pain, dizziness or headaches.  3. Hyperlipidemia: Not taking any medication for cholesterol. He is watching what he eats. He is limiting fat and salt. Not snacking as much. He rarely eats red meats. Mainly fish and chicken.   Overall, patient feels that he is doing ok and has no questions or concerns at this time (11 min)

## 2022-10-14 NOTE — Telephone Encounter (Signed)
Called patient back. Today he stated he still has two antibiotics which he never took. Stated he doesn't have any issues with his stomach and nobody told him why he should take those meds. I explained to him he needs to take those 4 meds for treating H. Pylori bacterium. Explained him he may not have any symptoms now but he may have in the future and it can lead to many stomach problems if left untreated. Patient stated he is unable to tolerate Pepto Bismol which made him sick when he was kid. Informed patient I will check with a doctor if there is any alternative and will call him back.   Dr. Barron Alvine is there any alternative to pepto Bismol? Please advise.

## 2022-10-14 NOTE — Telephone Encounter (Signed)
Advised patient to take Omeprazole and 2 antibiotics as prescribed for 14 days. Stop Omeprazole for 4 weeks. Then he needs to check stool test to confirm eradication of H. Pylori infection. Informed him I will call in 6 weeks to remind him that he is due for stool test. Patient voiced understanding. Set myself a reminder as well.

## 2022-10-22 DIAGNOSIS — I10 Essential (primary) hypertension: Secondary | ICD-10-CM | POA: Diagnosis not present

## 2022-10-22 DIAGNOSIS — E782 Mixed hyperlipidemia: Secondary | ICD-10-CM | POA: Diagnosis not present

## 2022-12-01 ENCOUNTER — Encounter: Payer: Self-pay | Admitting: Internal Medicine

## 2022-12-01 NOTE — Patient Instructions (Signed)

## 2022-12-01 NOTE — Progress Notes (Signed)
Future Appointments  Date Time Provider Department  08/26/2022                 had wellness    Tonya Cranford   12/02/2022                   6 mo  10:30 AM Lucky Cowboy, MD [facility]  03/04/2023                  9 mo ov 10:30 AM Adela Glimpse, NP [facility]  06/09/2023                    cpe 10:00 AM Lucky Cowboy, MD [facility]  09/14/2023 11:10 AM McKenzie, Mardene Celeste, MD [facility]    History of Present Illness:       This very nice 72 y.o. DWM presents for 6 month follow up with HTN, HLD, ASCAD,  COPD, Pre-Diabetes and Vitamin D Deficiency.  Chest CTscan on 01/28/2021 showed Aortic Atherosclerosis. Patient has  elevated PSA being followed in [place] by Dr Di Kindle.                                                           Patient is followed closely by Pulmonary ( Dr Gala Lewandowsky al ) with serial Chest CT scans for suspect LUL nodules .       Patient is treated for HTN (2004)  & BP has been controlled at home. Today's BP is at goal -  136/70 .    Patient has hx/o MI  ( 2005) and PCA/Stents.  Patient has had no complaints of any cardiac type chest pain, palpitations, dyspnea / orthopnea / PND, dizziness, claudication, or dependent edema.       Patient has hx /o  Statin Intolerance & Hyperlipidemia is not controlled with diet & meds. Patient denies myalgias or other med SE's. Last Lipids were not at goal:  Lab Results  Component Value Date   CHOL 252 (H) 05/18/2022   HDL 55 05/18/2022   LDLCALC 156 (H) 05/18/2022   TRIG 242 (H) 05/18/2022   CHOLHDL 4.6 05/18/2022     Also, the patient has history of PreDiabetes (A1c 5.7% /2010) and has had no symptoms of reactive hypoglycemia, diabetic polys, paresthesias or visual blurring.  Last A1c was not at goal:  Lab Results  Component Value Date   HGBA1C 5.9 (H) 05/18/2022                                                      Further, the patient also has history of Vitamin D Deficiency ("25" /2009)  and supplements  vitamin D without any suspected side-effects. Last vitamin D was at goal :  Lab Results  Component Value Date   VD25OH 84 05/18/2022       Current Outpatient Medications  Medication Instructions   acetaminophen 1,000 mg, Oral, At bedtime PRN   albuterol  HFA  inhaler 1-2 puffs, Inhalation, Every 6 hours PRN   aVITAMIN C 1,000 mg, Oral, Daily   aspirin EC 81 mg, Oral, Daily   SUPER B COMPLEX  1 tablet, Oral, Daily   bisoprolol-hctz 10-6.25 MG tablet TAKE 1 TABLET EVERY DAY    buPROPion SR 150 mg   twice daily   TRELEGY ELLIPTA 100-62.5-25  Use  1 Inhalation  Daily  for COPD / Asthma   Guaifenesin 1,200 mg, Oral, 2 times daily   Magnesium 250 mg, Oral, Daily PRN   Multiple Vitamin  1 tablet, Oral, Daily, Takes a vitamin pack daily    Omega-3 OMEGA 3  1 tablet, Oral, Daily, Takes Omega Red   saw palmetto 1,000 mg, Oral, Daily   Vitamin D 4,000 Units, Daily     Allergies  Allergen Reactions   Ace Inhibitors Cough   Ciprofloxacin    Fenofibrate dizzy    PMHx:   Past Medical History:  Diagnosis Date   BPH (benign prostatic hyperplasia)    COPD (chronic obstructive pulmonary disease) (HCC)  via CT lung    GERD (gastroesophageal reflux disease)    Hyperlipidemia    Hypertension    Hypogonadism male    Myocardial infarction [facility]) 2005   Per patient no stent or PTCA, just medical treatment   Prediabetes    Vitamin D deficiency      Immunization History  Administered Date(s) Administered   PPD Test 12/25/2013   Pneumococcal -13 02/28/2018   Pneumococcal-23 12/08/2010   Tdap 12/08/2010   Last Colon - 01/12/2019 - Dr Lorin Picket Carmon Ginsberg- [facility] - recc 5 year f/u due Aug /Sept 2025.  Past Surgical History:  Procedure Laterality Date   AMPUTATION FINGER / THUMB Left 1991   distal index   CARDIAC CATHETERIZATION N/A 2005   Per patient no stent or PTCA, just medical treatment   CATARACT EXTRACTION, BILATERAL Bilateral 2014   Dr. Vonna Kotyk    colonoscopy N/A 10/2012    Neg and recc f/u Cololnoscopy in 5 year (due 12/2023) - Dr Simon Rhein - Gastroenterologist   PROSTATE SURGERY     Biopsy X 3   VASECTOMY      FHx:    Reviewed / unchanged  SHx:    Reviewed / unchanged    Systems Review:  Constitutional: Denies fever, chills, wt changes, headaches, insomnia, fatigue, night sweats, change in appetite. Eyes: Denies redness, blurred vision, diplopia, discharge, itchy, watery eyes.  ENT: Denies discharge, congestion, post nasal drip, epistaxis, sore throat, earache, hearing loss, dental pain, tinnitus, vertigo, sinus pain, snoring.  CV: Denies chest pain, palpitations, irregular heartbeat, syncope, dyspnea, diaphoresis, orthopnea, PND, claudication or edema. Respiratory: denies cough, dyspnea, DOE, pleurisy, hoarseness, laryngitis, wheezing.  Gastrointestinal: Denies dysphagia, odynophagia, heartburn, reflux, water brash, abdominal pain or cramps, nausea, vomiting, bloating, diarrhea, constipation, hematemesis, melena, hematochezia  or hemorrhoids. Genitourinary: Denies dysuria, frequency, urgency, nocturia, hesitancy, discharge, hematuria or flank pain. Musculoskeletal: Denies arthralgias, myalgias, stiffness, jt. swelling, pain, limping or strain/sprain.  Skin: Denies pruritus, rash, hives, warts, acne, eczema or change in skin lesion(s). Neuro: No weakness, tremor, incoordination, spasms, paresthesia or pain. Psychiatric: Denies confusion, memory loss or sensory loss. Endo: Denies change in weight, skin or hair change.  Heme/Lymph: No excessive bleeding, bruising or enlarged lymph nodes.  Physical Exam  BP 136/70   Pulse 72   Temp 97.9 F (36.6 C)   Resp 17   Ht 6' (1.829 m)   Wt 162 lb 9.6 oz (73.8 kg)   SpO2 96%   BMI 22.05 kg/m   Appears  well nourished, well groomed  and in no distress.  Eyes: PERRLA, EOMs, conjunctiva no swelling or erythema. Sinuses:  No frontal/maxillary tenderness ENT/Mouth: EAC's clear, TM's nl w/o erythema,  bulging. Nares clear w/o erythema, swelling, exudates. Oropharynx clear without erythema or exudates. Oral hygiene is good. Tongue normal, non obstructing. Hearing intact.  Neck: Supple. Thyroid not palpable. Car 2+/2+ without bruits, nodes or JVD. Chest: Respirations nl with BS clear & equal w/o rales, rhonchi, wheezing or stridor.  Cor: Heart sounds normal w/ regular rate and rhythm without sig. murmurs, gallops, clicks or rubs. Peripheral pulses normal and equal  without edema.  Abdomen: Soft & bowel sounds normal. Non-tender w/o guarding, rebound, hernias, masses or organomegaly.  Lymphatics: Unremarkable.  Musculoskeletal: Full ROM all peripheral extremities, joint stability, 5/5 strength and normal gait.  Skin: Warm, dry without exposed rashes, lesions or ecchymosis apparent.  Neuro: Cranial nerves intact, reflexes equal bilaterally. Sensory-motor testing grossly intact. Tendon reflexes grossly intact.  Pysch: Alert & oriented x 3.  Insight and judgement nl & appropriate. No ideations.  Assessment and Plan:   1. Essential hypertension  - Continue medication, monitor blood pressure at home.  - Continue DASH diet.  Reminder to go to the ER if any CP,  SOB, nausea, dizziness, severe HA, changes vision/speech.    - CBC with Differential/Platelet - COMPLETE METABOLIC PANEL WITH GFR - Magnesium - TSH   2. Hyperlipidemia, mixed  - Continue diet/meds, exercise,& lifestyle modifications.  - Continue monitor periodic cholesterol/liver & renal functions      - Lipid panel - TSH   3. Abnormal glucose  - Continue diet, exercise  - Lifestyle modifications.  - Monitor appropriate labs.   - Hemoglobin A1C - Insulin, random   4. Vitamin D deficiency   - Continue supplementation.   - VITAMIN D 25 Hydroxy   5. Coronary artery disease involving native coronary                                 artery of native heart without angina pectoris  - Lipid panel   6. Aortic  atherosclerosis (HCC)  - Lipid panel   7. Medication management  - CBC with Differential/Platelet - COMPLETE METABOLIC PANEL WITH GFR - Magnesium - Lipid panel - TSH - Hemoglobin A1C w/out eAG - Insulin, random - VITAMIN D 25 Hydroxy           Discussed  regular exercise, BP monitoring, weight control to achieve/maintain BMI less than 25 and discussed med and SE's. Recommended labs to assess and monitor clinical status with further disposition pending results of labs.  I discussed the assessment and treatment plan with the patient. The patient was provided an opportunity to ask questions and all were answered. The patient agreed with the plan and demonstrated an understanding of the instructions.  I provided over 30 minutes of exam, counseling, chart review and  complex critical decision making.         The patient was advised to call back or seek an in-person evaluation if the symptoms worsen or if the condition fails to improve as anticipated.    Marinus Maw, MD.

## 2022-12-02 ENCOUNTER — Encounter: Payer: Self-pay | Admitting: Internal Medicine

## 2022-12-02 ENCOUNTER — Ambulatory Visit (INDEPENDENT_AMBULATORY_CARE_PROVIDER_SITE_OTHER): Payer: Medicare PPO | Admitting: Internal Medicine

## 2022-12-02 VITALS — BP 136/70 | HR 72 | Temp 97.9°F | Resp 17 | Ht 72.0 in | Wt 162.6 lb

## 2022-12-02 DIAGNOSIS — E782 Mixed hyperlipidemia: Secondary | ICD-10-CM | POA: Diagnosis not present

## 2022-12-02 DIAGNOSIS — I2510 Atherosclerotic heart disease of native coronary artery without angina pectoris: Secondary | ICD-10-CM | POA: Diagnosis not present

## 2022-12-02 DIAGNOSIS — R7309 Other abnormal glucose: Secondary | ICD-10-CM | POA: Diagnosis not present

## 2022-12-02 DIAGNOSIS — I10 Essential (primary) hypertension: Secondary | ICD-10-CM

## 2022-12-02 DIAGNOSIS — E559 Vitamin D deficiency, unspecified: Secondary | ICD-10-CM

## 2022-12-02 DIAGNOSIS — I872 Venous insufficiency (chronic) (peripheral): Secondary | ICD-10-CM | POA: Diagnosis not present

## 2022-12-02 DIAGNOSIS — Z79899 Other long term (current) drug therapy: Secondary | ICD-10-CM

## 2022-12-02 DIAGNOSIS — R600 Localized edema: Secondary | ICD-10-CM

## 2022-12-02 DIAGNOSIS — I700 Atherosclerosis of aorta: Secondary | ICD-10-CM

## 2022-12-02 MED ORDER — BISOPROLOL FUMARATE 5 MG PO TABS
ORAL_TABLET | ORAL | 3 refills | Status: AC
Start: 2022-12-02 — End: ?

## 2022-12-02 MED ORDER — TORSEMIDE 20 MG PO TABS
ORAL_TABLET | ORAL | 1 refills | Status: AC
Start: 2022-12-02 — End: ?

## 2022-12-03 LAB — COMPLETE METABOLIC PANEL WITH GFR
AG Ratio: 2.2 (calc) (ref 1.0–2.5)
ALT: 13 U/L (ref 9–46)
AST: 17 U/L (ref 10–35)
Albumin: 4.1 g/dL (ref 3.6–5.1)
Alkaline phosphatase (APISO): 89 U/L (ref 35–144)
BUN: 19 mg/dL (ref 7–25)
CO2: 28 mmol/L (ref 20–32)
Calcium: 9 mg/dL (ref 8.6–10.3)
Chloride: 106 mmol/L (ref 98–110)
Creat: 0.93 mg/dL (ref 0.70–1.28)
Globulin: 1.9 g/dL (calc) (ref 1.9–3.7)
Glucose, Bld: 111 mg/dL — ABNORMAL HIGH (ref 65–99)
Potassium: 4.1 mmol/L (ref 3.5–5.3)
Sodium: 140 mmol/L (ref 135–146)
Total Bilirubin: 1 mg/dL (ref 0.2–1.2)
Total Protein: 6 g/dL — ABNORMAL LOW (ref 6.1–8.1)
eGFR: 87 mL/min/{1.73_m2} (ref >=60)

## 2022-12-03 LAB — CBC WITH DIFFERENTIAL/PLATELET
Absolute Monocytes: 790 cells/uL (ref 200–950)
Basophils Absolute: 92 cells/uL (ref 0–200)
Basophils Relative: 1.1 %
Eosinophils Absolute: 109 cells/uL (ref 15–500)
Eosinophils Relative: 1.3 %
HCT: 39.9 % (ref 38.5–50.0)
Hemoglobin: 13.6 g/dL (ref 13.2–17.1)
Lymphs Abs: 2083 cells/uL (ref 850–3900)
MCH: 32 pg (ref 27.0–33.0)
MCHC: 34.1 g/dL (ref 32.0–36.0)
MCV: 93.9 fL (ref 80.0–100.0)
MPV: 9.9 fL (ref 7.5–12.5)
Monocytes Relative: 9.4 %
Neutro Abs: 5326 cells/uL (ref 1500–7800)
Neutrophils Relative %: 63.4 %
Platelets: 144 10*3/uL (ref 140–400)
RBC: 4.25 10*6/uL (ref 4.20–5.80)
RDW: 12.8 % (ref 11.0–15.0)
Total Lymphocyte: 24.8 %
WBC: 8.4 10*3/uL (ref 3.8–10.8)

## 2022-12-03 LAB — LIPID PANEL
Cholesterol: 232 mg/dL — ABNORMAL HIGH (ref <200)
HDL: 51 mg/dL (ref >=40)
LDL Cholesterol (Calc): 142 mg/dL (calc) — ABNORMAL HIGH
Non-HDL Cholesterol (Calc): 181 mg/dL (calc) — ABNORMAL HIGH (ref <130)
Total CHOL/HDL Ratio: 4.5 (calc) (ref <5.0)
Triglycerides: 256 mg/dL — ABNORMAL HIGH (ref <150)

## 2022-12-03 LAB — HEMOGLOBIN A1C W/OUT EAG: Hgb A1c MFr Bld: 5.8 % of total Hgb — ABNORMAL HIGH (ref <5.7)

## 2022-12-03 LAB — INSULIN, RANDOM: Insulin: 21.6 u[IU]/mL — ABNORMAL HIGH

## 2022-12-03 LAB — TSH: TSH: 1.59 mIU/L (ref 0.40–4.50)

## 2022-12-03 LAB — MAGNESIUM: Magnesium: 2.1 mg/dL (ref 1.5–2.5)

## 2022-12-03 LAB — VITAMIN D 25 HYDROXY (VIT D DEFICIENCY, FRACTURES): Vit D, 25-Hydroxy: 89 ng/mL (ref 30–100)

## 2022-12-04 ENCOUNTER — Other Ambulatory Visit: Payer: Self-pay | Admitting: Internal Medicine

## 2022-12-04 DIAGNOSIS — E782 Mixed hyperlipidemia: Secondary | ICD-10-CM

## 2022-12-04 MED ORDER — ROSUVASTATIN CALCIUM 20 MG PO TABS
ORAL_TABLET | ORAL | 3 refills | Status: AC
Start: 2022-12-04 — End: ?

## 2022-12-04 NOTE — Progress Notes (Signed)
^<^<^<^<^<^<^<^<^<^<^<^<^<^<^<^<^<^<^<^<^<^<^<^<^<^<^<^<^<^<^<^<^<^<^<^<^ ^>^>^>^>^>^>^>^>^>^>^>>^>^>^>^>^>^>^>^>^>^>^>^>^>^>^>^>^>^>^>^>^>^>^>^>^>  -Test results slightly outside the reference range are not unusual. If there is anything important, I will review this with you,  otherwise it is considered normal test values.  If you have further questions,  please do not hesitate to contact me at the office or via My Chart.   ^<^<^<^<^<^<^<^<^<^<^<^<^<^<^<^<^<^<^<^<^<^<^<^<^<^<^<^<^<^<^<^<^<^<^<^<^ ^>^>^>^>^>^>^>^>^>^>^>^>^>^>^>^>^>^>^>^>^>^>^>^>^>^>^>^>^>^>^>^>^>^>^>^>^  - Total Chol = 232     is very high risk for Heart Attack /Stroke /Vascular Dementia     ( Ideal or Goal is less than 180 ! )  & - Bad /Dangerous LDL Chol = 142   - - >> Sitting on a time Bomb !     ( Ideal or Goal is less than 70 ! )    - The cause is Bad Diet !  - But need to go ahead & start meds until get on a better diet to try &                                                                      reverse some of the Damage already done   - Read or listen to   Dr Gerri Spore 's book    " How Not to Die ! "    - Recommend a stricter plant based low cholesterol diet   - Cholesterol only comes from animal sources                                                                  - ie. meat, dairy, egg yolks  - Eat all the vegetables you want.  - Avoid Meat, Avoid Meat , Avoid Meat  ! ! !                                                   -especially red meat - Beef AND Pork  - Avoid cheese & dairy - milk & ice cream.   - Cheese is the most concentrated form of trans-fats which                                                     is the worst thing to clog up our arteries.   - Veggie cheese is OK which can be found in                                              the fresh produce section at  [employer] or [employer]  or [employer] ^>^>^>^>^>^>^>^>^>^>^>^>^>^>^>^>^>^>^>^>^>^>^>^>^>^>^>^>^>^>^>^>^>^>^>^>^ ^>^>^>^>^>^>^>^>^>^>^>^>^>^>^>^>^>^>^>^>^>^>^>^>^>^>^>^>^>^>^>^>^>^>^>^>^  -  Also Triglycerides (   = 256   ) or fats in blood are too high                 (   Ideal or  Goal is less than 150  !  )    - Recommend avoid fried & greasy foods,  sweets / candy,   - Avoid white rice  (brown or wild rice or Quinoa is OK),   - Avoid white potatoes  (sweet potatoes are OK)   - Avoid anything made from white flour  - bagels, doughnuts, rolls, buns, biscuits, white and   wheat breads, pizza crust and traditional  pasta made of white flour & egg white  - (vegetarian pasta or spinach or wheat pasta is OK).    - Multi-grain bread is OK - like multi-grain flat bread or  sandwich thins.   - Avoid alcohol in excess.   - Exercise is also important.  ^>^>^>^>^>^>^>^>^>^>^>^>^>^>^>^>^>^>^>^>^>^>^>^>^>^>^>^>^>^>^>^>^>^>^>^>^  -  A1c = 5.8%  - Still Blood sugar and A1c are elevated in the borderline and  early or pre-diabetes range which has the same   300% increased risk for heart attack, stroke, cancer and   alzheimer- type vascular dementia as full blown diabetes.   But the good news is that diet, exercise with  weight loss can cure the early diabetes at this point.  ^>^>^>^>^>^>^>^>^>^>^>^>^>^>^>^>^>^>^>^>^>^>^>^>^>^>^>^>^>^>^>^>^>^>^>^>^  -  Vitamin D= 89 - Excellent  - Please keep dose same   ^>^>^>^>^>^>^>^>^>^>^>^>^>^>^>^>^>^>^>^>^>^>^>^>^>^>^>^>^>^>^>^>^>^>^>^>^  -  All Else - CBC - Kidneys - Electrolytes - Liver - Magnesium & Thyroid    - all  Normal / OK  ^>^>^>^>^>^>^>^>^>^>^>^>^>^>^>^>^>^>^>^>^>^>^>^>^>^>^>^>^>^>^>^>^>^>^>^>^>^ ^>^>^>^>^>^>^>^>^>^>^>^>^>^>^>^>^>^>^>^>^>^>^>^>^>^>^>^>^>^>^>^>^>^>^>^>^>^

## 2022-12-08 ENCOUNTER — Telehealth: Payer: Self-pay

## 2022-12-08 NOTE — Telephone Encounter (Signed)
LVM to patient to return the call. Need to inform the patient that he is due for H. Pylori stool test to confirm eradication. Test is already ordered in Epic. Patient needs to be off of Omeprazole 4 weeks prior to stool test.

## 2022-12-09 ENCOUNTER — Ambulatory Visit: Payer: Medicare PPO | Admitting: Pulmonary Disease

## 2022-12-09 NOTE — Telephone Encounter (Signed)
Left detailed VM. Reminder letter sent to Patient's home address as well.

## 2023-01-06 ENCOUNTER — Ambulatory Visit
Admission: RE | Admit: 2023-01-06 | Discharge: 2023-01-06 | Disposition: A | Discharge status: Home / Self Care | Payer: Medicare PPO | Source: Ambulatory Visit | Attending: Nurse Practitioner | Admitting: Nurse Practitioner

## 2023-01-06 DIAGNOSIS — R918 Other nonspecific abnormal finding of lung field: Secondary | ICD-10-CM | POA: Diagnosis not present

## 2023-01-06 DIAGNOSIS — R911 Solitary pulmonary nodule: Secondary | ICD-10-CM

## 2023-01-06 DIAGNOSIS — I2510 Atherosclerotic heart disease of native coronary artery without angina pectoris: Secondary | ICD-10-CM | POA: Diagnosis not present

## 2023-01-06 DIAGNOSIS — F17210 Nicotine dependence, cigarettes, uncomplicated: Secondary | ICD-10-CM | POA: Diagnosis not present

## 2023-01-20 ENCOUNTER — Ambulatory Visit: Payer: Medicare PPO | Admitting: Pulmonary Disease

## 2023-02-02 NOTE — Progress Notes (Signed)
Synopsis: Referred in Jan 2024 for Lung nodule by Lucky Cowboy, MD  Subjective:   PATIENT ID: Michael Portela GENDER: male DOB: 12/16/1950, MRN: [id number]  Chief Complaint  Patient presents with   Follow-up    F/up on Super D CT scan.    This is a 72 year old gentleman past medical history of COPD, gastroesophageal reflux hyperlipidemia, hypertension.Patient was referred after abnormal lung cancer screening CT completed on 06/04/2022.  Patient was found to have innumerable small calcified and noncalcified pulmonary nodules that were again noted.  There was a new nodule in the left upper lobe which led to concern.  He has a mean dry volume of 8.4 mm has macrolobulated margins.  It is concerning for small neoplasm.  He has extensive bilateral pleural parenchymal thickening and evidence of centrilobular and paraseptal emphysema.  Patient has smoked for 50+ years.  Still smoking 1 pack/day.  Has been diagnosed with COPD in the past based on the prior DOT physical.  And that he is currently managed with Trelegy and as needed albuterol.  OV 02/03/2023: Here today for follow-up after CT imaging.  Unfortunately he is still smoking.  He still smoking at least a pack per day.  Follow-up CT imaging reveals stability of the pulmonary nodule that we have been following.  He is going through a lot of stressful things right now.  His son was incarcerated he is now having to take care of his 53-year-old grandson alone.  Using Trelegy daily.    Past Medical History:  Diagnosis Date   Arthritis    right wrist   BPH (benign prostatic hyperplasia)    COPD (chronic obstructive pulmonary disease) (HCC)    via CT lung   Dyspnea    GERD (gastroesophageal reflux disease)    yrs ago, no current problems, no meds as of 07/23/22   Hyperlipidemia    Hypertension    Hypogonadism male    Myocardial infarction [facility]) 2005   Per patient no stent or PTCA, just medical treatment   Pneumonia    x 1   Prediabetes     Vitamin D deficiency      Family History  Problem Relation Age of Onset   Cancer Father 20       lymphoma   Cancer Maternal Aunt        Lung   Cancer Maternal Uncle        Lung   Cancer Maternal Uncle        Lung   Stroke Maternal Grandmother    Heart failure Maternal Grandfather    Pneumonia Paternal Grandmother    Liver disease Paternal Grandfather    Stroke Son 68   Other Son        internal intestinal hemorage   Esophageal cancer Neg Hx    Stomach cancer Neg Hx      Past Surgical History:  Procedure Laterality Date   AMPUTATION FINGER / THUMB Left 1991   distal index   BRONCHIAL BIOPSY  07/27/2022   Procedure: BRONCHIAL BIOPSIES;  Surgeon: Josephine Igo, DO;  Location: [facility];  Service: Pulmonary;;   BRONCHIAL BRUSHINGS  07/27/2022   Procedure: BRONCHIAL BRUSHINGS;  Surgeon: Josephine Igo, DO;  Location: [facility];  Service: Pulmonary;;   BRONCHIAL NEEDLE ASPIRATION BIOPSY  07/27/2022   Procedure: BRONCHIAL NEEDLE ASPIRATION BIOPSIES;  Surgeon: Josephine Igo, DO;  Location: [facility];  Service: Pulmonary;;   CARDIAC CATHETERIZATION N/A 2005   Per patient no stent or  PTCA, just medical treatment   CATARACT EXTRACTION, BILATERAL Bilateral 2014   Dr. Vonna Kotyk    colonoscopy N/A 10/2012   Neg and recc f/u Cololnoscopy in 5 yeary - Dr Lorin Picket O/neill - Gastroenterologist   FIDUCIAL MARKER PLACEMENT  07/27/2022   Procedure: FIDUCIAL MARKER PLACEMENT;  Surgeon: Josephine Igo, DO;  Location: [facility];  Service: Pulmonary;;   PROSTATE SURGERY     Biopsy X 3   VASECTOMY     VIDEO BRONCHOSCOPY WITH ENDOBRONCHIAL ULTRASOUND Left 07/27/2022   Procedure: VIDEO BRONCHOSCOPY WITH ENDOBRONCHIAL ULTRASOUND;  Surgeon: Josephine Igo, DO;  Location: [facility];  Service: Pulmonary;  Laterality: Left;    Social History   Socioeconomic History   Marital status: Divorced    Spouse name: Not on file   Number of children: Not on file   Years of education: Not on  file   Highest education level: Not on file  Occupational History   Not on file  Tobacco Use   Smoking status: Every Day    Current packs/day: 1.00    Average packs/day: 1 pack/day for 59.7 years (59.7 ttl pk-yrs)    Types: Cigarettes    Start date: 1965   Smokeless tobacco: Never   Tobacco comments:    Smokes a pack a cigarettes a day. 02/03/2023 Tay  Vaping Use   Vaping status: Never Used  Substance and Sexual Activity   Alcohol use: Not Currently    Alcohol/week: 5.0 standard drinks of alcohol    Types: 5 Cans of beer per week    Comment: occasional   Drug use: No   Sexual activity: Not on file  Other Topics Concern   Not on file  Social History Narrative   Epworth Sleepiness score 5    Social Determinants of Health   Financial Resource Strain: Not on file  Food Insecurity: Not on file  Transportation Needs: Not on file  Physical Activity: Not on file  Stress: Not on file  Social Connections: Not on file  Intimate Partner Violence: Not on file     Allergies  Allergen Reactions   Ace Inhibitors Cough   Ciprofloxacin Other (See Comments)    Unknown   Fenofibrate     dizzy     Outpatient Medications Prior to Visit  Medication Sig Dispense Refill   acetaminophen (TYLENOL) 500 MG tablet Take 1,000 mg by mouth at bedtime as needed for moderate pain or mild pain.     albuterol (VENTOLIN HFA) 108 (90 Base) MCG/ACT inhaler Inhale 1-2 puffs into the lungs every 6 (six) hours as needed for wheezing or shortness of breath. 54 g 1   ascorbic acid (VITAMIN C) 500 MG tablet Take 1,000 mg by mouth daily.     aspirin EC 81 MG tablet Take 81 mg by mouth daily.     B Complex-C (SUPER B COMPLEX PO) Take 1 tablet by mouth daily.     bisoprolol (ZEBETA) 5 MG tablet Take  1 tablet  Daily  for BP & Heart 90 tablet 3   Cholecalciferol (VITAMIN D) 50 MCG (2000 UT) CAPS 4,000 Units daily.     Fluticasone-Umeclidin-Vilant (TRELEGY ELLIPTA) 100-62.5-25 MCG/ACT AEPB Use  1 Inhalation   Daily  for COPD / Asthma 180 each 3   Guaifenesin 1200 MG TB12 Take 1,200 mg by mouth 2 (two) times daily.     Magnesium 250 MG TABS Take 250 mg by mouth daily as needed (Constipation).     Multiple Vitamin (MULTIVITAMIN) tablet  Take 1 tablet by mouth daily. Takes a vitamin pack daily     Omega-3 Fatty Acids (OMEGA 3 PO) Take 1 tablet by mouth daily. Takes Omega Red     saw palmetto 500 MG capsule Take 1,000 mg by mouth daily.     buPROPion (WELLBUTRIN SR) 150 MG 12 hr tablet Take 1 tablet (150 mg total) by mouth 2 (two) times daily. Take one tab daily for 3 days then increase to twice daily (Patient taking differently: Take 150 mg by mouth daily.) 60 tablet 2   rosuvastatin (CRESTOR) 20 MG tablet Take  1 tablet  Daily  for Cholesterol (Patient not taking: Reported on 02/03/2023) 90 tablet 3   torsemide (DEMADEX) 20 MG tablet Take  1/2 to 1 tablet   Daily  for BP & Fluid Retention / Ankle /Leg Swelling (Patient not taking: Reported on 02/03/2023) 90 tablet 1   No facility-administered medications prior to visit.    Review of Systems  Constitutional:  Negative for chills, fever, malaise/fatigue and weight loss.  HENT:  Negative for hearing loss, sore throat and tinnitus.   Eyes:  Negative for blurred vision and double vision.  Respiratory:  Positive for cough and shortness of breath. Negative for hemoptysis, sputum production, wheezing and stridor.   Cardiovascular:  Negative for chest pain, palpitations, orthopnea, leg swelling and PND.  Gastrointestinal:  Negative for abdominal pain, constipation, diarrhea, heartburn, nausea and vomiting.  Genitourinary:  Negative for dysuria, hematuria and urgency.  Musculoskeletal:  Negative for joint pain and myalgias.  Skin:  Negative for itching and rash.  Neurological:  Negative for dizziness, tingling, weakness and headaches.  Endo/Heme/Allergies:  Negative for environmental allergies. Does not bruise/bleed easily.  Psychiatric/Behavioral:  Negative  for depression. The patient is not nervous/anxious and does not have insomnia.   All other systems reviewed and are negative.    Objective:  Physical Exam Vitals reviewed.  Constitutional:      General: He is not in acute distress.    Appearance: He is well-developed.  HENT:     Head: Normocephalic and atraumatic.     Mouth/Throat:     Pharynx: No oropharyngeal exudate.  Eyes:     Conjunctiva/sclera: Conjunctivae normal.     Pupils: Pupils are equal, round, and reactive to light.  Neck:     Vascular: No JVD.     Trachea: No tracheal deviation.     Comments: Loss of supraclavicular fat Cardiovascular:     Rate and Rhythm: Normal rate and regular rhythm.     Heart sounds: S1 normal and S2 normal.     Comments: Distant heart tones Pulmonary:     Effort: No tachypnea or accessory muscle usage.     Breath sounds: No stridor. Decreased breath sounds (throughout all lung fields) present. No wheezing, rhonchi or rales.     Comments: Diminished breath sounds bilaterally Abdominal:     General: Bowel sounds are normal. There is no distension.     Palpations: Abdomen is soft.     Tenderness: There is no abdominal tenderness.  Musculoskeletal:        General: Deformity (muscle wasting ) present.  Skin:    General: Skin is warm and dry.     Capillary Refill: Capillary refill takes less than 2 seconds.     Findings: No rash.  Neurological:     Mental Status: He is alert and oriented to person, place, and time.  Psychiatric:        Behavior: Behavior  normal.      Vitals:   02/03/23 1137  BP: 120/60  Pulse: 64  SpO2: 95%  Weight: 161 lb 3.2 oz (73.1 kg)  Height: 6' (1.829 m)    95% on RA BMI Readings from Last 3 Encounters:  02/03/23 21.86 kg/m  12/02/22 22.05 kg/m  08/26/22 23.85 kg/m   Wt Readings from Last 3 Encounters:  02/03/23 161 lb 3.2 oz (73.1 kg)  12/02/22 162 lb 9.6 oz (73.8 kg)  08/26/22 166 lb 3.2 oz (75.4 kg)     CBC    Component Value  Date/Time   WBC 8.4 12/02/2022 1034   RBC 4.25 12/02/2022 1034   HGB 13.6 12/02/2022 1034   HCT 39.9 12/02/2022 1034   PLT 144 12/02/2022 1034   MCV 93.9 12/02/2022 1034   MCH 32.0 12/02/2022 1034   MCHC 34.1 12/02/2022 1034   RDW 12.8 12/02/2022 1034   LYMPHSABS 2,083 12/02/2022 1034   MONOABS 768 11/08/2016 0854   EOSABS 109 12/02/2022 1034   BASOSABS 92 12/02/2022 1034    Chest Imaging: Lung cancer screening CT January 2024: 8.4 mm macrolobulated nodule in the left upper lobe The patient's images have been independently reviewed by me.  August 2024 lung chest CT: Lung nodule stable. The patient's images have been independently reviewed by me.    Pulmonary Functions Testing Results:    Latest Ref Rng & Units 08/19/2022    9:59 AM  PFT Results  FVC-Pre L 3.03   FVC-Predicted Pre % 67   FVC-Post L 3.13   FVC-Predicted Post % 69   Pre FEV1/FVC % % 42   Post FEV1/FCV % % 41   FEV1-Pre L 1.29   FEV1-Predicted Pre % 39   FEV1-Post L 1.30   DLCO uncorrected ml/min/mmHg 10.75   DLCO UNC% % 40   DLCO corrected ml/min/mmHg 10.84   DLCO COR %Predicted % 41   DLVA Predicted % 51   TLC L 6.93   TLC % Predicted % 95   RV % Predicted % 137     FeNO:   Pathology:   Echocardiogram:   Heart Catheterization:     Assessment & Plan:     ICD-10-CM   1. Encounter for screening for lung cancer  Z12.2 Ambulatory Referral for Lung Cancer Scre    2. Lung nodule  R91.1     3. COPD, severe (HCC)  J44.9     4. Nodule of upper lobe of left lung  R91.1        Discussion:  This is a 72 year old gentleman, history of pulmonary nodule left upper lobe, prior bronchoscopy negative, associated paraseptal and centrilobular emphysema.  Patient has severe COPD currently on Trelegy.  Plan: Repeat CT imaging can be complete in 1 year. Will enroll him low-dose lung cancer screening CT again. His previous nodules have been stable. Counseled heavily on smoking cessation please see  separate documentation. Continue Trelegy for COPD management. Return to clinic in August after LDCT complete.   Current Outpatient Medications:    acetaminophen (TYLENOL) 500 MG tablet, Take 1,000 mg by mouth at bedtime as needed for moderate pain or mild pain., Disp: , Rfl:    albuterol (VENTOLIN HFA) 108 (90 Base) MCG/ACT inhaler, Inhale 1-2 puffs into the lungs every 6 (six) hours as needed for wheezing or shortness of breath., Disp: 54 g, Rfl: 1   ascorbic acid (VITAMIN C) 500 MG tablet, Take 1,000 mg by mouth daily., Disp: , Rfl:    aspirin  EC 81 MG tablet, Take 81 mg by mouth daily., Disp: , Rfl:    B Complex-C (SUPER B COMPLEX PO), Take 1 tablet by mouth daily., Disp: , Rfl:    bisoprolol (ZEBETA) 5 MG tablet, Take  1 tablet  Daily  for BP & Heart, Disp: 90 tablet, Rfl: 3   Cholecalciferol (VITAMIN D) 50 MCG (2000 UT) CAPS, 4,000 Units daily., Disp: , Rfl:    Fluticasone-Umeclidin-Vilant (TRELEGY ELLIPTA) 100-62.5-25 MCG/ACT AEPB, Use  1 Inhalation  Daily  for COPD / Asthma, Disp: 180 each, Rfl: 3   Guaifenesin 1200 MG TB12, Take 1,200 mg by mouth 2 (two) times daily., Disp: , Rfl:    Magnesium 250 MG TABS, Take 250 mg by mouth daily as needed (Constipation)., Disp: , Rfl:    Multiple Vitamin (MULTIVITAMIN) tablet, Take 1 tablet by mouth daily. Takes a vitamin pack daily, Disp: , Rfl:    Omega-3 Fatty Acids (OMEGA 3 PO), Take 1 tablet by mouth daily. Takes Omega Red, Disp: , Rfl:    saw palmetto 500 MG capsule, Take 1,000 mg by mouth daily., Disp: , Rfl:    buPROPion (WELLBUTRIN SR) 150 MG 12 hr tablet, Take 1 tablet (150 mg total) by mouth 2 (two) times daily. Take one tab daily for 3 days then increase to twice daily (Patient taking differently: Take 150 mg by mouth daily.), Disp: 60 tablet, Rfl: 2   rosuvastatin (CRESTOR) 20 MG tablet, Take  1 tablet  Daily  for Cholesterol (Patient not taking: Reported on 02/03/2023), Disp: 90 tablet, Rfl: 3   torsemide (DEMADEX) 20 MG tablet, Take   1/2 to 1 tablet   Daily  for BP & Fluid Retention / Ankle /Leg Swelling (Patient not taking: Reported on 02/03/2023), Disp: 90 tablet, Rfl: 1   Josephine Igo, DO [facility] 02/03/2023 12:56 PM

## 2023-02-03 ENCOUNTER — Encounter: Payer: Self-pay | Admitting: Pulmonary Disease

## 2023-02-03 ENCOUNTER — Ambulatory Visit: Payer: Medicare PPO | Admitting: Pulmonary Disease

## 2023-02-03 VITALS — BP 120/60 | HR 64 | Ht 72.0 in | Wt 161.2 lb

## 2023-02-03 DIAGNOSIS — J449 Chronic obstructive pulmonary disease, unspecified: Secondary | ICD-10-CM

## 2023-02-03 DIAGNOSIS — F17210 Nicotine dependence, cigarettes, uncomplicated: Secondary | ICD-10-CM | POA: Diagnosis not present

## 2023-02-03 DIAGNOSIS — R911 Solitary pulmonary nodule: Secondary | ICD-10-CM

## 2023-02-03 DIAGNOSIS — Z122 Encounter for screening for malignant neoplasm of respiratory organs: Secondary | ICD-10-CM

## 2023-02-03 NOTE — Patient Instructions (Signed)
Thank you for visiting Dr. Tonia Brooms at [facility]. Today we recommend the following:  Orders Placed This Encounter  Procedures   Ambulatory Referral for Lung Cancer Scre   Return in about 1 year (around 02/03/2024) for with APP or Dr. Tonia Brooms, after CT Chest.    Please do your part to reduce the spread of COVID-19.

## 2023-02-03 NOTE — Progress Notes (Signed)
Smoking Cessation Counseling:   The patient's current tobacco use: 1ppd The patient was advised to quit and impact of smoking on their health.  I assessed the patient's willingness to attempt to quit. I provided methods and skills for cessation. We reviewed medication management of smoking session drugs if appropriate. Resources to help quit smoking were provided. A smoking cessation quit date was set: Jan 1st Follow-up was arranged in our clinic.  The amount of time spent counseling patient was 4 mins    Josephine Igo, DO [facility] 02/03/2023 11:49 AM

## 2023-02-10 ENCOUNTER — Other Ambulatory Visit: Payer: Self-pay

## 2023-02-10 DIAGNOSIS — R918 Other nonspecific abnormal finding of lung field: Secondary | ICD-10-CM | POA: Diagnosis not present

## 2023-02-10 DIAGNOSIS — J449 Chronic obstructive pulmonary disease, unspecified: Secondary | ICD-10-CM | POA: Diagnosis not present

## 2023-02-10 DIAGNOSIS — J439 Emphysema, unspecified: Secondary | ICD-10-CM | POA: Diagnosis not present

## 2023-02-10 DIAGNOSIS — R002 Palpitations: Secondary | ICD-10-CM | POA: Diagnosis not present

## 2023-02-10 DIAGNOSIS — J440 Chronic obstructive pulmonary disease with acute lower respiratory infection: Secondary | ICD-10-CM | POA: Diagnosis not present

## 2023-02-10 DIAGNOSIS — I10 Essential (primary) hypertension: Secondary | ICD-10-CM | POA: Diagnosis not present

## 2023-02-10 DIAGNOSIS — R0602 Shortness of breath: Secondary | ICD-10-CM | POA: Diagnosis not present

## 2023-02-10 DIAGNOSIS — D696 Thrombocytopenia, unspecified: Secondary | ICD-10-CM | POA: Diagnosis not present

## 2023-02-10 DIAGNOSIS — E278 Other specified disorders of adrenal gland: Secondary | ICD-10-CM | POA: Diagnosis not present

## 2023-02-10 DIAGNOSIS — J1569 Pneumonia due to other gram-negative bacteria: Secondary | ICD-10-CM | POA: Diagnosis not present

## 2023-02-10 DIAGNOSIS — I358 Other nonrheumatic aortic valve disorders: Secondary | ICD-10-CM | POA: Diagnosis not present

## 2023-02-10 DIAGNOSIS — N179 Acute kidney failure, unspecified: Secondary | ICD-10-CM | POA: Diagnosis not present

## 2023-02-10 DIAGNOSIS — I4891 Unspecified atrial fibrillation: Secondary | ICD-10-CM | POA: Diagnosis not present

## 2023-02-10 DIAGNOSIS — R17 Unspecified jaundice: Secondary | ICD-10-CM | POA: Diagnosis not present

## 2023-02-10 DIAGNOSIS — I351 Nonrheumatic aortic (valve) insufficiency: Secondary | ICD-10-CM | POA: Diagnosis not present

## 2023-02-10 DIAGNOSIS — A419 Sepsis, unspecified organism: Secondary | ICD-10-CM | POA: Diagnosis not present

## 2023-02-10 DIAGNOSIS — J189 Pneumonia, unspecified organism: Secondary | ICD-10-CM | POA: Diagnosis not present

## 2023-02-10 DIAGNOSIS — I2510 Atherosclerotic heart disease of native coronary artery without angina pectoris: Secondary | ICD-10-CM | POA: Diagnosis not present

## 2023-02-10 DIAGNOSIS — R600 Localized edema: Secondary | ICD-10-CM | POA: Diagnosis not present

## 2023-02-10 DIAGNOSIS — R9431 Abnormal electrocardiogram [ECG] [EKG]: Secondary | ICD-10-CM | POA: Diagnosis not present

## 2023-02-10 DIAGNOSIS — J441 Chronic obstructive pulmonary disease with (acute) exacerbation: Secondary | ICD-10-CM | POA: Diagnosis not present

## 2023-02-10 DIAGNOSIS — E860 Dehydration: Secondary | ICD-10-CM | POA: Diagnosis not present

## 2023-02-10 DIAGNOSIS — J159 Unspecified bacterial pneumonia: Secondary | ICD-10-CM | POA: Diagnosis not present

## 2023-02-22 ENCOUNTER — Other Ambulatory Visit: Payer: Self-pay | Admitting: Internal Medicine

## 2023-02-22 DIAGNOSIS — I10 Essential (primary) hypertension: Secondary | ICD-10-CM

## 2023-02-22 MED ORDER — BISOPROLOL-HYDROCHLOROTHIAZIDE 10-6.25 MG PO TABS
ORAL_TABLET | ORAL | 3 refills | Status: AC
Start: 2023-02-22 — End: ?

## 2023-02-23 ENCOUNTER — Ambulatory Visit (INDEPENDENT_AMBULATORY_CARE_PROVIDER_SITE_OTHER): Payer: Medicare PPO | Admitting: Nurse Practitioner

## 2023-02-23 ENCOUNTER — Encounter: Payer: Self-pay | Admitting: Nurse Practitioner

## 2023-02-23 ENCOUNTER — Ambulatory Visit
Admission: RE | Admit: 2023-02-23 | Discharge: 2023-02-23 | Disposition: A | Discharge status: Home / Self Care | Payer: Medicare PPO | Source: Ambulatory Visit | Attending: Nurse Practitioner | Admitting: Nurse Practitioner

## 2023-02-23 VITALS — BP 104/58 | HR 66 | Temp 97.9°F | Ht 72.0 in | Wt 158.8 lb

## 2023-02-23 DIAGNOSIS — I4891 Unspecified atrial fibrillation: Secondary | ICD-10-CM | POA: Diagnosis not present

## 2023-02-23 DIAGNOSIS — Z09 Encounter for follow-up examination after completed treatment for conditions other than malignant neoplasm: Secondary | ICD-10-CM | POA: Diagnosis not present

## 2023-02-23 DIAGNOSIS — F17200 Nicotine dependence, unspecified, uncomplicated: Secondary | ICD-10-CM

## 2023-02-23 DIAGNOSIS — J189 Pneumonia, unspecified organism: Secondary | ICD-10-CM | POA: Diagnosis not present

## 2023-02-23 DIAGNOSIS — J449 Chronic obstructive pulmonary disease, unspecified: Secondary | ICD-10-CM

## 2023-02-23 NOTE — Progress Notes (Signed)
Hospital follow up  Assessment and Plan: Hospital visit follow up for:   Hospital discharge follow-up Reviewed discharge instructions in full including medication changes, diagnostics, labs, and future follow ups appointment. All questions and concerns addressed.    Community acquired pneumonia of right upper lobe of lung Resolving Complete updated CXR for review of resolution to underlying disease process Obtain CBC and CMP   Atrial fibrillation, new onset (HCC) Continue Eliquis Refer back to Cardiology, Dr. 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[facility]) Defer Pulmonology referral per patient request - does not wish to follow with Pulmonology Continue Portable Oxygen Tank 2L O2 via [place] Request portable oxygen device  Smoker Patient has quit  Smoking cessation instruction/counseling given:  counseled patient on the dangers of tobacco use, advised patient to stop smoking, and reviewed strategies to maximize success  Orders Placed This Encounter  Procedures   DG Chest 2 View    Standing Status:   Future    Standing Expiration Date:   02/23/2024    Order Specific Question:   Reason for Exam (SYMPTOM  OR DIAGNOSIS REQUIRED)    Answer:   Hospital follow up for CAP: right perhilar upper and lower lobe PNA    Order Specific Question:   Preferred imaging location?    Answer:   [id number]   CBC with Differential/Platelet   COMPLETE METABOLIC PANEL WITH GFR   Ambulatory referral to Cardiology    Referral Priority:   Routine    Referral Type:   Consultation    Referral Reason:   Specialty Services Required    Referred to Provider:   Lennette Bihari, MD    Number of Visits Requested:   1   PR PORTABLE OXYGEN CONCENTRATOR   All medications were reviewed with patient and family and fully reconciled. All questions answered fully, and patient and family members were encouraged to call the office with any further questions or concerns. Discussed goal to avoid readmission related to  this diagnosis.   Over 40 minutes of exam, counseling, chart review, and complex, high/moderate level critical decision making was performed this visit.   Future Appointments  Date Time Provider Department Center  03/04/2023 10:30 AM Adela Glimpse, NP [facility] None  06/09/2023 10:00 AM Lucky Cowboy, MD [facility] None  08/31/2023 11:30 AM Adela Glimpse, NP [facility] None  09/05/2023 11:00 AM [facility] None  09/14/2023 11:10 AM McKenzie, Mardene Celeste, MD [facility] None     HPI 72 y.o.male presents for follow up for transition from recent hospitalization or SNIF stay. Admit date to the hospital was 07/27/22, patient was discharged from the hospital on 07/27/22 and our clinical staff contacted the office the day after discharge to set up a follow up appointment. The discharge summary, medications, and diagnostic test results were reviewed before meeting with the patient. The patient was admitted for:   Reports becoming acutely ill on 02/09/23 with increase in SOB and sputum production along with generalized weakness, dizziness and lightheadedness.  Due to worsening symptoms he he went to the ER.  CXR revealed right perhilar upper and lower lobe PNA.  Received SoluMedrol, Rocephin, NS 1L bolus and breathing tmts.  Also treated with IV Zosyn, Zithromax and Vancomycin.  Concerns for possible Pseudomonas.  Treated with Flagyl.    Developed A-fib and transferred to ICU, placed on Cardizem.  Chemically converted back to NSR overnight.  Again converted to A-fib and Cardizem re-administered.  Converted back to NSR.  Echocardiogram obtained preserved EF 55 - 60%  with normal diastolic function.  Mild aortic regurg noted.  Needed *L O2 during exertion.  Weaned down to 5 L O2 at rest then 1L at rest upon discharge.  Discharged on Augmentin.  Was also discharged on Lasix.    He presents today with supplemental oxygen tank 2L via [place].  His saturation 96%.  He continues to have a lingering cough.  Reports  completed full course of abx that he was discharged ono.  Started taking his Eliquis today.  Has also been taking Lasix for BLE.  His BP is low today in clinic but stable.  He is asymptomatic.  He is is NSR.  Home health is not involved.   Images while in the hospital: CT Super D Chest Wo Contrast  Result Date: 01/13/2023 CLINICAL DATA:  Follow-up lung nodule.  Current smoker. EXAM: CT CHEST WITHOUT CONTRAST TECHNIQUE: Multidetector CT imaging of the chest was performed using thin slice collimation for electromagnetic bronchoscopy planning purposes, without intravenous contrast. RADIATION DOSE REDUCTION: This exam was performed according to the departmental dose-optimization program which includes automated exposure control, adjustment of the mA and/or kV according to patient size and/or use of iterative reconstruction technique. COMPARISON:  CT 10/01/2022 and 07/05/2022 FINDINGS: Cardiovascular: Heart size is normal. No pericardial effusion. Aortic atherosclerosis and coronary artery calcifications. Mediastinum/Nodes: Thyroid gland, trachea, and esophagus appear normal. Calcified mediastinal and hilar lymph nodes compatible with remote granulomatous disease. No enlarged mediastinal or axillary lymph nodes. Lungs/Pleura: Moderate to advanced changes of COPD/emphysema. Innumerable calcified granulomas are scattered throughout both lungs. No pleural effusion, airspace consolidation, atelectasis or pneumothorax. Pleuroparenchymal scarring is identified within the lateral right lung base, unchanged from the prior exam. Scattered lung nodules are again noted including: -triangular-shaped nodule in the anterior left upper lobe measures 5 mm, image 29/4. Previously 6 mm. Posteromedial left upper lobe lung nodule measures 5 mm, image 37/5. Unchanged from previous exam. Round solid nodule within the posterolateral right upper lobe measures 3 mm, image 36/5. Unchanged. The peripheral nodule within the left lower lobe  measures 5 mm, image 116/5. Stable from previous exam. Choose unchanged appearance of pleuroparenchymal scarring within both lung apices, right greater than left. The left upper lobe with adjacent fiducial marker measures 6 mm, image 44/5. This is unchanged compared with the previous exam. Upper Abdomen: No acute abnormality. Calcified granulomas noted within the spleen. Musculoskeletal: No chest wall mass or suspicious bone lesions identified. IMPRESSION: 1. Stable appearance of bilateral lung nodules. The largest nodule measures 6 mm within the left upper lobe with adjacent fiducial marker. No new or progressive findings identified. 2. Coronary artery calcifications. 3. Aortic Atherosclerosis (ICD10-I70.0) and Emphysema (ICD10-J43.9). Electronically Signed   By: Signa Kell M.D.   On: 01/13/2023 16:04      Current Outpatient Medications (Cardiovascular):    bisoprolol-hydrochlorothiazide (ZIAC) 10-6.25 MG tablet, Take 1 tablet every Morning for BP   diltiazem (CARDIZEM CD) 240 MG 24 hr capsule, Take 240 mg by mouth daily.   furosemide (LASIX) 40 MG tablet, Take 40 mg by mouth as needed.   rosuvastatin (CRESTOR) 20 MG tablet, Take 20 mg by mouth daily.  Current Outpatient Medications (Respiratory):    albuterol (VENTOLIN HFA) 108 (90 Base) MCG/ACT inhaler, Inhale 1-2 puffs into the lungs every 6 (six) hours as needed for wheezing or shortness of breath.   Fluticasone-Umeclidin-Vilant (TRELEGY ELLIPTA) 100-62.5-25 MCG/ACT AEPB, Use  1 Inhalation  Daily  for COPD / Asthma   Guaifenesin 1200 MG TB12, Take 1,200 mg by mouth  2 (two) times daily.  Current Outpatient Medications (Analgesics):    acetaminophen (TYLENOL) 500 MG tablet, Take 1,000 mg by mouth at bedtime as needed for moderate pain or mild pain. (Patient not taking: Reported on 02/23/2023)   aspirin EC 81 MG tablet, Take 81 mg by mouth daily. (Patient not taking: Reported on 02/23/2023)  Current Outpatient Medications (Hematological):     apixaban (ELIQUIS) 5 MG TABS tablet, Take 5 mg by mouth 2 (two) times daily.  Current Outpatient Medications (Other):    B Complex-C (SUPER B COMPLEX PO), Take 1 tablet by mouth daily.   buPROPion (WELLBUTRIN SR) 150 MG 12 hr tablet, Take 1 tablet (150 mg total) by mouth 2 (two) times daily. Take one tab daily for 3 days then increase to twice daily (Patient taking differently: Take 150 mg by mouth daily.)   Magnesium 250 MG TABS, Take 250 mg by mouth daily as needed (Constipation).   Multiple Vitamin (MULTIVITAMIN) tablet, Take 1 tablet by mouth daily. Takes a vitamin pack daily   ascorbic acid (VITAMIN C) 500 MG tablet, Take 1,000 mg by mouth daily. (Patient not taking: Reported on 02/23/2023)   Cholecalciferol (VITAMIN D) 50 MCG (2000 UT) CAPS, 4,000 Units daily.   Omega-3 Fatty Acids (OMEGA 3 PO), Take 1 tablet by mouth daily. Takes Omega Red (Patient not taking: Reported on 02/23/2023)   saw palmetto 500 MG capsule, Take 1,000 mg by mouth daily. (Patient not taking: Reported on 02/23/2023)  Past Medical History:  Diagnosis Date   Arthritis    right wrist   BPH (benign prostatic hyperplasia)    COPD (chronic obstructive pulmonary disease) (HCC)    via CT lung   Dyspnea    GERD (gastroesophageal reflux disease)    yrs ago, no current problems, no meds as of 07/23/22   Hyperlipidemia    Hypertension    Hypogonadism male    Myocardial infarction [facility]) 2005   Per patient no stent or PTCA, just medical treatment   Pneumonia    x 1   Prediabetes    Vitamin D deficiency      Allergies  Allergen Reactions   Ace Inhibitors Cough   Ciprofloxacin Other (See Comments)    Unknown   Fenofibrate     dizzy    ROS: all negative except above.   Physical Exam: Filed Weights   02/23/23 0948  Weight: 158 lb 12.8 oz (72 kg)   BP (!) 104/58   Pulse 66   Temp 97.9 F (36.6 C)   Ht 6' (1.829 m)   Wt 158 lb 12.8 oz (72 kg)   SpO2 96%   BMI 21.54 kg/m  General Appearance: Well  nourished, in no apparent distress. Eyes: PERRLA, EOMs, conjunctiva no swelling or erythema Sinuses: No Frontal/maxillary tenderness ENT/Mouth: Ext aud canals clear, TMs without erythema, bulging. No erythema, swelling, or exudate on post pharynx.  Tonsils not swollen or erythematous. Hearing normal.  Neck: Supple, thyroid normal.  Respiratory: 2L supplemental oxygen via [place].  Respiratory effort normal, BS equal bilaterally without rales, rhonchi, wheezing or stridor.  Cardio: NSR with no MRGs. Brisk peripheral pulses without edema.  Abdomen: Soft, + BS.  Non tender, no guarding, rebound, hernias, masses. Lymphatics: Non tender without lymphadenopathy.  Musculoskeletal: Full ROM, 5/5 strength, normal gait.  Skin: Warm, dry without rashes, lesions, ecchymosis.  Neuro: Cranial nerves intact. Normal muscle tone, no cerebellar symptoms. Sensation intact.  Psych: Awake and oriented X 3, normal affect, Insight and Judgment appropriate.  Adela Glimpse, NP 10:55 AM [facility]

## 2023-02-23 NOTE — Patient Instructions (Signed)
Atrial Fibrillation Atrial fibrillation (AFib) is a type of heartbeat that is irregular or fast. If you have AFib, your heart beats without any order. This makes it hard for your heart to pump blood in a normal way. AFib may come and go, or it may become a long-lasting problem. If AFib is not treated, it can put you at higher risk for stroke, heart failure, and other heart problems. What are the causes? AFib may be caused by diseases that damage the heart's electrical system. They include: High blood pressure. Heart failure. Heart valve diseases. Heart surgery. Diabetes. Thyroid disease. Kidney disease. Lung diseases, such as pneumonia or COPD. Sleep apnea. Sometimes the cause is not known. What increases the risk? You are more likely to develop AFib if: You are older. You exercise often and very hard. You have a family history of AFib. You are male. You are Caucasian. You are overweight. You smoke. You drink a lot of alcohol. What are the signs or symptoms? Common symptoms of this condition include: A feeling that your heart is beating very fast. Chest pain or discomfort. Feeling short of breath. Suddenly feeling light-headed or weak. Getting tired easily during activity. Fainting. Sweating. In some cases, there are no symptoms. How is this treated? Medicines to: Prevent blood clots. Treat heart rate or heart rhythm problems. Using devices, such as a pacemaker, to correct heart rhythm problems. Doing surgery to remove the part of the heart that sends bad signals. Closing an area where clots can form in the heart (left atrial appendage). In some cases, your doctor will treat other underlying conditions. Follow these instructions at home: Medicines Take over-the-counter and prescription medicines only as told by your doctor. Do not take any new medicines without first talking to your doctor. If you are taking blood thinners: Talk with your doctor before taking aspirin  or NSAIDs, such as ibuprofen. Take your medicines as told. Take them at the same time each day. Do not do things that could hurt or bruise you. Be careful to avoid falls. Wear an alert bracelet or carry a card that says you take blood thinners. Lifestyle Do not smoke or use any products that contain nicotine or tobacco. If you need help quitting, ask your doctor. Eat heart-healthy foods. Talk with your doctor about the right eating plan for you. Exercise regularly as told by your doctor. Do not drink alcohol. Lose weight if you are overweight. General instructions If you have sleep apnea, treat it as told by your doctor. Do not use diet pills unless your doctor says they are safe for you. Diet pills may make heart problems worse. Keep all follow-up visits. Your doctor will check your heart rate and rhythm regularly. Contact a doctor if: You notice a change in the speed, rhythm, or strength of your heartbeat. You are taking a blood-thinning medicine and you get more bruising. You get tired more easily when you move or exercise. You have a sudden change in weight. Get help right away if:  You have pain in your chest. You have trouble breathing. You have side effects of blood thinners, such as blood in your vomit, poop (stool), or pee (urine), or bleeding that cannot stop. You have any signs of a stroke. "BE FAST" is an easy way to remember the main warning signs: B - Balance. Dizziness, sudden trouble walking, or loss of balance. E - Eyes. Trouble seeing or a change in how you see. F - Face. Sudden weakness or loss of  feeling in the face. The face or eyelid may droop on one side. A - Arms.Weakness or loss of feeling in an arm. This happens suddenly and usually on one side of the body. S - Speech. Sudden trouble speaking, slurred speech, or trouble understanding what people say. T - Time.Time to call emergency services. Write down what time symptoms started. You have other signs of a  stroke, such as: A sudden, very bad headache with no known cause. Feeling like you may vomit (nausea). Vomiting. A seizure. These symptoms may be an emergency. Get help right away. Call 911. Do not wait to see if the symptoms will go away. Do not drive yourself to the hospital. This information is not intended to replace advice given to you by your health care provider. Make sure you discuss any questions you have with your health care provider. Document Revised: 01/27/2022 Document Reviewed: 01/27/2022 Elsevier Patient Education  2024 [employer].

## 2023-02-24 LAB — COMPLETE METABOLIC PANEL WITH GFR
AG Ratio: 1.1 (calc) (ref 1.0–2.5)
ALT: 17 U/L (ref 9–46)
AST: 17 U/L (ref 10–35)
Albumin: 2.9 g/dL — ABNORMAL LOW (ref 3.6–5.1)
Alkaline phosphatase (APISO): 64 U/L (ref 35–144)
BUN: 17 mg/dL (ref 7–25)
CO2: 31 mmol/L (ref 20–32)
Calcium: 8.5 mg/dL — ABNORMAL LOW (ref 8.6–10.3)
Chloride: 100 mmol/L (ref 98–110)
Creat: 0.99 mg/dL (ref 0.70–1.28)
Globulin: 2.6 g/dL (ref 1.9–3.7)
Glucose, Bld: 77 mg/dL (ref 65–99)
Potassium: 4.4 mmol/L (ref 3.5–5.3)
Sodium: 141 mmol/L (ref 135–146)
Total Bilirubin: 1 mg/dL (ref 0.2–1.2)
Total Protein: 5.5 g/dL — ABNORMAL LOW (ref 6.1–8.1)
eGFR: 80 mL/min/{1.73_m2} (ref >=60)

## 2023-02-24 LAB — CBC WITH DIFFERENTIAL/PLATELET
Absolute Monocytes: 806 {cells}/uL (ref 200–950)
Basophils Absolute: 29 {cells}/uL (ref 0–200)
Basophils Relative: 0.3 %
Eosinophils Absolute: 38 {cells}/uL (ref 15–500)
Eosinophils Relative: 0.4 %
HCT: 37.1 % — ABNORMAL LOW (ref 38.5–50.0)
Hemoglobin: 12.3 g/dL — ABNORMAL LOW (ref 13.2–17.1)
Lymphs Abs: 1344 {cells}/uL (ref 850–3900)
MCH: 32.4 pg (ref 27.0–33.0)
MCHC: 33.2 g/dL (ref 32.0–36.0)
MCV: 97.6 fL (ref 80.0–100.0)
MPV: 8.9 fL (ref 7.5–12.5)
Monocytes Relative: 8.4 %
Neutro Abs: 7382 {cells}/uL (ref 1500–7800)
Neutrophils Relative %: 76.9 %
Platelets: 451 10*3/uL — ABNORMAL HIGH (ref 140–400)
RBC: 3.8 10*6/uL — ABNORMAL LOW (ref 4.20–5.80)
RDW: 12.5 % (ref 11.0–15.0)
Total Lymphocyte: 14 %
WBC: 9.6 10*3/uL (ref 3.8–10.8)

## 2023-02-25 ENCOUNTER — Telehealth: Payer: Self-pay | Admitting: Internal Medicine

## 2023-02-25 ENCOUNTER — Telehealth: Payer: Self-pay | Admitting: Nurse Practitioner

## 2023-02-25 ENCOUNTER — Other Ambulatory Visit: Payer: Self-pay | Admitting: Nurse Practitioner

## 2023-02-25 MED ORDER — DOXYCYCLINE HYCLATE 100 MG PO TABS
100.0000 mg | ORAL_TABLET | Freq: Two times a day (BID) | ORAL | 0 refills | Status: AC
Start: 2023-02-25 — End: 2023-03-04

## 2023-02-25 NOTE — Telephone Encounter (Signed)
Nurse called concerned about pt's BP today. It is 99/49. She is wondering if he should stop taking Lasix. Also she was asking about chest X-ray results done on 10/02

## 2023-02-25 NOTE — Telephone Encounter (Signed)
Patient called to request POC, traveling on 03/02/23. Patient recently d/c from hospital, [facility], [place].  Per Adela Glimpse, NP, Portable Oxygen Concentrator liter flow 1ml ordered via [facility]. Patient advised to call local DME- [facility] ph [phone], fax [phone] and let them know he needs a Travel Request. Patient agrees to call and make request and appreciates POC order. Advised patient to call this PCP office 02/28/23 if POC not yet delivered.

## 2023-02-25 NOTE — Telephone Encounter (Signed)
Patient inquiring about the order for his oxygen tank. Please advise.

## 2023-02-28 ENCOUNTER — Inpatient Hospital Stay: Payer: Medicare PPO | Admitting: Nurse Practitioner

## 2023-02-28 ENCOUNTER — Other Ambulatory Visit: Payer: Self-pay

## 2023-02-28 ENCOUNTER — Other Ambulatory Visit: Payer: Self-pay | Admitting: Internal Medicine

## 2023-02-28 ENCOUNTER — Inpatient Hospital Stay
Admission: RE | Admit: 2023-02-28 | Discharge: 2023-02-28 | Disposition: A | Discharge status: Home / Self Care | Payer: Self-pay | Source: Ambulatory Visit | Attending: Nurse Practitioner | Admitting: Nurse Practitioner

## 2023-02-28 DIAGNOSIS — J449 Chronic obstructive pulmonary disease, unspecified: Secondary | ICD-10-CM

## 2023-02-28 DIAGNOSIS — R918 Other nonspecific abnormal finding of lung field: Secondary | ICD-10-CM

## 2023-03-02 ENCOUNTER — Other Ambulatory Visit: Payer: Self-pay | Admitting: Internal Medicine

## 2023-03-02 DIAGNOSIS — E782 Mixed hyperlipidemia: Secondary | ICD-10-CM

## 2023-03-02 MED ORDER — ROSUVASTATIN CALCIUM 20 MG PO TABS: ORAL_TABLET | ORAL | 3 refills | Status: AC

## 2023-03-02 MED ORDER — BUPROPION HCL ER (XL) 300 MG PO TB24: ORAL_TABLET | ORAL | 3 refills | Status: AC

## 2023-03-04 ENCOUNTER — Ambulatory Visit: Payer: Medicare PPO | Admitting: Nurse Practitioner

## 2023-03-16 ENCOUNTER — Ambulatory Visit: Payer: Medicare PPO | Admitting: Nurse Practitioner

## 2023-03-16 ENCOUNTER — Ambulatory Visit (INDEPENDENT_AMBULATORY_CARE_PROVIDER_SITE_OTHER): Payer: Medicare PPO | Admitting: Nurse Practitioner

## 2023-03-16 ENCOUNTER — Ambulatory Visit
Admission: RE | Admit: 2023-03-16 | Discharge: 2023-03-16 | Disposition: A | Discharge status: Home / Self Care | Payer: Medicare PPO | Source: Ambulatory Visit | Attending: Nurse Practitioner | Admitting: Nurse Practitioner

## 2023-03-16 ENCOUNTER — Encounter: Payer: Self-pay | Admitting: Nurse Practitioner

## 2023-03-16 VITALS — BP 150/76 | HR 83 | Temp 98.0°F | Ht 72.0 in | Wt 149.2 lb

## 2023-03-16 DIAGNOSIS — R918 Other nonspecific abnormal finding of lung field: Secondary | ICD-10-CM | POA: Diagnosis not present

## 2023-03-16 DIAGNOSIS — I10 Essential (primary) hypertension: Secondary | ICD-10-CM

## 2023-03-16 DIAGNOSIS — E559 Vitamin D deficiency, unspecified: Secondary | ICD-10-CM

## 2023-03-16 DIAGNOSIS — F17200 Nicotine dependence, unspecified, uncomplicated: Secondary | ICD-10-CM | POA: Diagnosis not present

## 2023-03-16 DIAGNOSIS — E782 Mixed hyperlipidemia: Secondary | ICD-10-CM

## 2023-03-16 DIAGNOSIS — J449 Chronic obstructive pulmonary disease, unspecified: Secondary | ICD-10-CM

## 2023-03-16 DIAGNOSIS — R079 Chest pain, unspecified: Secondary | ICD-10-CM | POA: Diagnosis not present

## 2023-03-16 DIAGNOSIS — I2510 Atherosclerotic heart disease of native coronary artery without angina pectoris: Secondary | ICD-10-CM

## 2023-03-16 DIAGNOSIS — I700 Atherosclerosis of aorta: Secondary | ICD-10-CM | POA: Diagnosis not present

## 2023-03-16 DIAGNOSIS — Z6823 Body mass index (BMI) 23.0-23.9, adult: Secondary | ICD-10-CM

## 2023-03-16 DIAGNOSIS — N401 Enlarged prostate with lower urinary tract symptoms: Secondary | ICD-10-CM

## 2023-03-16 DIAGNOSIS — N138 Other obstructive and reflux uropathy: Secondary | ICD-10-CM

## 2023-03-16 NOTE — Patient Instructions (Signed)
COPD and Physical Activity Chronic obstructive pulmonary disease (COPD) is a long-term, or chronic, condition that affects the lungs. COPD is a general term that can be used to describe many problems that cause inflammation of the lungs and limit airflow. These conditions include chronic bronchitis and emphysema. The main symptom of COPD is shortness of breath, which makes it harder to do even simple tasks. This can also make it harder to exercise and stay active. Talk with your health care provider about treatments to help you breathe better and actions you can take to prevent breathing problems during physical activity. What are the benefits of exercising when you have COPD? Exercising regularly is an important part of a healthy lifestyle. You can still exercise and do physical activities even though you have COPD. Exercise and physical activity improve your shortness of breath by increasing blood flow (circulation). This causes your heart to pump more oxygen through your body. Moderate exercise can: Improve oxygen use. Increase your energy level. Help with shortness of breath. Strengthen your breathing muscles. Improve heart health. Help with sleep. Improve your self-esteem and feelings of self-worth. Lower depression, stress, and anxiety. Exercise can benefit everyone with COPD. The severity of your disease may affect how hard you can exercise, especially at first, but everyone can benefit. Talk with your health care provider about how much exercise is safe for you, and which activities and exercises are safe for you. What actions can I take to prevent breathing problems during physical activity? Sign up for a pulmonary rehabilitation program. This type of program may include: Education about lung diseases. Exercise classes that teach you how to exercise and be more active while improving your breathing. This usually involves: Exercise using your lower extremities, such as a stationary  bicycle. About 30 minutes of exercise, 2 to 5 times per week, for 6 to 12 weeks. Strength training, such as push-ups or leg lifts. Nutrition education. Group classes in which you can talk with others who also have COPD and learn ways to manage stress. If you use an oxygen tank, you should use it while you exercise. Work with your health care provider to adjust your oxygen for your physical activity. Your resting flow rate is different from your flow rate during physical activity. How to manage your breathing while exercising While you are exercising: Take slow breaths. Pace yourself, and do nottry to go too fast. Purse your lips while breathing out. Pursing your lips is similar to a kissing or whistling position. If doing exercise that uses a quick burst of effort, such as weight lifting: Breathe in before starting the exercise. Breathe out during the hardest part of the exercise, such as raising the weights. Where to find support You can find support for exercising with COPD from: Your health care provider. A pulmonary rehabilitation program. Your local health department or community health programs. Support groups, either online or in-person. Your health care provider may be able to recommend support groups. Where to find more information You can find more information about exercising with COPD from: American Lung Association: lung.org COPD Foundation: copdfoundation.org Contact a health care provider if: Your symptoms get worse. You have nausea. You have a fever. You want to start a new exercise program or a new activity. Get help right away if: You have chest pain. You cannot breathe. These symptoms may represent a serious problem that is an emergency. Do not wait to see if the symptoms will go away. Get medical help right away. Call  your local emergency services (911 in the [place] Do not drive yourself to the hospital. Summary COPD is a general term that can be used to describe  many different lung problems that cause lung inflammation and limit airflow. This includes chronic bronchitis and emphysema. Exercise and physical activity improve your shortness of breath by increasing blood flow (circulation). This causes your heart to provide more oxygen to your body. Contact your health care provider before starting any exercise program or new activity. Ask your health care provider what exercises and activities are safe for you. This information is not intended to replace advice given to you by your health care provider. Make sure you discuss any questions you have with your health care provider. Document Revised: 03/18/2020 Document Reviewed: 03/18/2020 Elsevier Patient Education  2024 [employer].

## 2023-03-16 NOTE — Progress Notes (Signed)
FOLLOW UP  Assessment:   [name] was seen today for follow-up and medicare wellness.  Diagnoses and all orders for this visit:  Aortic atherosclerosis (HCC) Continue statin - recently started 2 weeks ago - defer lipid check today - obtain 05/2023 NOV. Per CT 10/2019 and numerous others Discussed lifestyle modifications. Recommended diet heavy in fruits and veggies, omega 3's. Decrease consumption of animal meats, cheeses, and dairy products. Remain active and exercise as tolerated. Continue to monitor.  Chronic obstructive pulmonary disease, unspecified COPD type (HCC)/Pulmonary nodules/Smoker Right anterior chest pain - order placed for updated CXR.   Pulmonology following. Restart Wellbutrin  Continue inhalers as directed. Continue Chest CT Screening as directed Smoking cessation completed Praise given - continue to monitor  Essential hypertension Restart Ziac 5/3.125 mg  Discussed DASH (Dietary Approaches to Stop Hypertension) DASH diet is lower in sodium than a typical American diet. Cut back on foods that are high in saturated fat, cholesterol, and trans fats. Eat more whole-grain foods, fish, poultry, and nuts Remain active and exercise as tolerated daily.  Monitor BP at home-Call if greater than 130/80 to schedule OV and discuss medication management. Check CMP/CBC  CAD Control blood pressure, cholesterol, glucose, increase exercise.  Monitor weight.  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 Julien Girt. Monitor annual PSA Continue to monitor  Vitamin D deficiency Continue supplement for goal of 60-100 Monitor Vitamin D levels  Hyperlipidemia, mixed Declines medications Discussed lifestyle modifications. Recommended diet heavy in fruits and veggies, omega 3's. Decrease consumption of animal meats, cheeses, and dairy products. Remain active and exercise as tolerated. Continue to monitor. Check  lipids/TSH - defers labs check today.   Re-check at next OV (3 mo).  BMI 23.0-23.9, adult Discussed appropriate BMI Diet modification. Physical activity. Encouraged/praised to build confidence.  Patient recently started statin medication - will defer lipid panel today.  Most recent blood work 02/23/23 stable.  Notify office for further evaluation and treatment, questions or concerns if any reported s/s fail to improve.   The patient was advised to call back or seek an in-person evaluation if any symptoms worsen or if the condition fails to improve as anticipated.   Further disposition pending results of labs. Discussed med's effects and SE's.    I discussed the assessment and treatment plan with the patient. The patient was provided an opportunity to ask questions and all were answered. The patient agreed with the plan and demonstrated an understanding of the instructions.  Discussed med's effects and SE's. Screening labs and tests as requested with regular follow-up as recommended.  I provided 30 minutes of face-to-face time during this encounter including counseling, chart review, and critical decision making was preformed.  Today's Plan of Care is based on a patient-centered health care approach known as shared decision making - the decisions, tests and treatments allow for patient preferences and values to be balanced with clinical evidence.     Future Appointments  Date Time Provider Department Center  04/13/2023  1:40 PM Lennette Bihari, MD [facility] None  07/07/2023 10:00 AM Lucky Cowboy, MD [facility] None  09/05/2023 11:00 AM [facility] None  09/14/2023 11:10 AM McKenzie, Mardene Celeste, MD [facility] None  10/11/2023  9:30 AM Adela Glimpse, NP [facility] None    Subjective:  Michael Michael [name] Michael [name] is a 72 y.o. male who presents for a 3 month follow up for HTN,HLD, glucose management, and vitamin D Def.   Overall he reports feeling well.  Recently discharged from hospital  02/10/23 for PNA.  Had supplemental oxygen which he is not wearing today.  Has noticed increase in night sweats x3 nights along with right sided chest pain that radiates substernal.  Most notable upon inspiration.  Denies fever, chill, SOB.    Had updated chest CT for lung screening that confirmed lung nodules.  He now follows with [facility], last seen 08/19/22 for nodule of left upper lobe and severe emphysema. He is a patient of Dr. Myrlene Broker. Associated COPD.  Uses Trelegy and Albuterol.  He had his bronchoscopy on 3/5; cytology without malignant cells. He had some granulomatous inflammation and pulmonary macrophages. He recovered well from his procedure. No hemoptysis or significant residual cough.   He had PFTs completed 07/27/22 with severe obstruction and diffusion defect. No reversibility. He continues daily inhalers and has stopped smoking x 2 mo.  Per Pulmonology 07/27/22:  Hypermetabolic LUL pulmonary nodule. He had tissue sampling with cytology negative for malignancy. Possible this is a granuloma; however, needs to be monitored closely as he is high risk for a primary lung cancer. We will plan to repeat CT in 6 weeks for 3 month follow up to assess for growth. If this continues to grow, we would need to consider repeat biopsy. He would not be a surgical candidate due to FEV1 of 39%.   He currently continues to smoke 1 pack a day;  He was started on Wellbutrin but felt it was dropping his blood pressure.  He discussed symptoms with Cardiologist and plans to restart.   He also had a EGD with GI for evaluation of abnormal hypermetabolism on PET. Findings consistent with gastritis and H. Pylori. He has been on PPI.    He has not heard from the ENT group regarding hypermetabolism of the parotid gland. He will continue to follow up on this.   He has an appointment with urology next week.   BMI is Body mass index is 20.24 kg/m., he has been working on diet and exercise, still works,  Curator and farm work, very active.  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 He is established with Dr. 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 Recent CTs have demonstrated aortic atherosclerosis and 3 vessel CAD.   His blood pressure has been controlled at home, today their BP is BP: (!) 150/76  He has been noticing x3 days of night sweats with a drop in BP in the mornings 90/60 therefore he has not taken BP medications today.    He does not workout. He denies chest pain, dizziness. More exertional dyspnea in the last year with brisk walking or heaving lifting.   BP Readings from Last 3 Encounters:  03/16/23 (!) 150/76  02/23/23 (!) 104/58  02/03/23 120/60    He is not on cholesterol medication (rosuvastatin 10 mg was prescribed but never started, extended discussion today, denies hx of intolerance) His cholesterol is not at goal. The cholesterol last visit was:   Lab Results  Component Value Date   CHOL 232 (H) 12/02/2022   HDL 51 12/02/2022   LDLCALC 142 (H) 12/02/2022   TRIG 256 (  H) 12/02/2022   CHOLHDL 4.5 12/02/2022   He has been working on diet and exercise for glucose management, and denies increased appetite, nausea, paresthesia of the feet, polydipsia, polyuria and visual disturbances. Last A1C in the office was:  Lab Results  Component Value Date   HGBA1C 5.8 (H) 12/02/2022   Last GFR Lab Results  Component Value Date   GFRNONAA >60 07/27/2022   Patient is on Vitamin D supplement.   Lab Results  Component Value Date   VD25OH 52 12/02/2022       He is previously established with Dr. Brunilda Payor for BPH/elevated PSAs which have been stable- negative biopsy x3 in the past. Lab Results   Component Value Date   PSA 7.22 (H) 05/04/2021   PSA 7.14 (H) 05/01/2020   PSA 5.6 (H) 03/13/2019     Medication Review:   Current Outpatient Medications (Cardiovascular):    furosemide (LASIX) 40 MG tablet, Take 40 mg by mouth as needed.   rosuvastatin (CRESTOR) 20 MG tablet, Take  1 tablet  Daily  for Cholesterol                                       /                                                                   TAKE                                         BY                                                 MOUTH   bisoprolol-hydrochlorothiazide (ZIAC) 10-6.25 MG tablet, Take 1 tablet every Morning for BP (Patient not taking: Reported on 03/16/2023)   diltiazem (CARDIZEM CD) 240 MG 24 hr capsule, Take 240 mg by mouth daily. (Patient not taking: Reported on 03/16/2023)  Current Outpatient Medications (Respiratory):    albuterol (VENTOLIN HFA) 108 (90 Base) MCG/ACT inhaler, Inhale 1-2 puffs into the lungs every 6 (six) hours as needed for wheezing or shortness of breath.   Fluticasone-Umeclidin-Vilant (TRELEGY ELLIPTA) 100-62.5-25 MCG/ACT AEPB, INHALE 1 PUFF DAILY FOR COPD / ASTHMA   Guaifenesin 1200 MG TB12, Take 1,200 mg by mouth 2 (two) times daily.   Current Outpatient Medications (Hematological):    apixaban (ELIQUIS) 5 MG TABS tablet, Take 5 mg by mouth 2 (two) times daily.  Current Outpatient Medications (Other):    B Complex-C (SUPER B COMPLEX PO), Take 1 tablet by mouth daily.   buPROPion (WELLBUTRIN XL) 300 MG 24 hr tablet, Take 1 tablet Daily for Mood, Focus & Concentration.   Cholecalciferol (VITAMIN D) 50 MCG (2000 UT) CAPS, 4,000 Units daily.   Magnesium 250 MG TABS, Take 250 mg by mouth daily as needed (Constipation).   Multiple Vitamin (MULTIVITAMIN) tablet, Take 1 tablet by mouth daily. Takes a vitamin pack daily  Allergies: Allergies  Allergen Reactions  Ace Inhibitors Cough   Ciprofloxacin Other (See Comments)    Unknown   Fenofibrate     dizzy     Current Problems (verified) has BPH with obstruction/lower urinary tract symptoms; Vitamin D deficiency; COPD, severe (HCC); Essential hypertension; Hyperlipidemia, mixed; Abnormal glucose; Tobacco abuse; CAD (coronary artery disease); History of amputation of finger of left hand; Elevated PSA; Aortic atherosclerosis (HCC) by Chest CT scan on 10/27/2020; FHx: heart disease; Pulmonary nodules; History of colonic polyps (hyperplastic x 3 in 2014); Screening for colorectal cancer; Prostate enlargement; Parotid nodule; Infrarenal abdominal aortic aneurysm (AAA) without rupture (HCC); and Abnormal gastrointestinal PET scan on their problem list.  Screening Tests Immunization History  Administered Date(s) Administered   PPD Test 12/25/2013   Pneumococcal Conjugate-13 02/28/2018   Pneumococcal-Unspecified 12/08/2010   Td 08/07/2021   Tdap 12/08/2010    Patient Care Team: Lucky Cowboy, MD as PCP - General (Internal Medicine) Su Grand, MD (Inactive) as Consulting Physician (Urology) Sundra Aland, [facility] (Inactive) as Pharmacist (Pharmacist)  Surgical: He  has a past surgical history that includes Prostate surgery; Amputation finger / thumb (Left, 1991); Vasectomy; colonoscopy (N/A, 10/2012); Cardiac catheterization (N/A, 2005); Cataract extraction, bilateral (Bilateral, 2014); Video bronchoscopy with endobronchial ultrasound (Left, 07/27/2022); Bronchial needle aspiration biopsy (07/27/2022); Bronchial biopsy (07/27/2022); Bronchial brushings (07/27/2022); and Fiducial marker placement (07/27/2022). Family His family history includes Cancer in his maternal aunt, maternal uncle, and maternal uncle; Cancer (age of onset: 50) in his father; Heart failure in his maternal grandfather; Liver disease in his paternal grandfather; Other in his son; Pneumonia in his paternal grandmother; Stroke in his maternal grandmother; Stroke (age of onset: 64) in his son. Social history  He reports that he has quit  smoking. His smoking use included cigarettes. He started smoking about 59 years ago. He has a 59.8 pack-year smoking history. He has never used smokeless tobacco. He reports that he does not currently use alcohol after a past usage of about 5.0 standard drinks of alcohol per week. He reports that he does not use drugs. Objective:   Today's Vitals   03/16/23 0959  BP: (!) 150/76  Pulse: 83  Temp: 98 F (36.7 C)  SpO2: 98%  Weight: 149 lb 3.2 oz (67.7 kg)  Height: 6' (1.829 m)   Body mass index is 20.24 kg/m.  General appearance: alert, no distress, WD/WN, male HEENT: normocephalic, sclerae anicteric, TMs pearly, nares patent, no discharge or erythema, pharynx normal Oral cavity: MMM, no lesions Neck: supple, no lymphadenopathy, no thyromegaly, no masses Heart: RRR, normal S1, S2, no murmurs Lungs: CTA bilaterally, no wheezes, rhonchi, or rales Abdomen: +Bs, soft, non tender, non distended, no masses, no hepatomegaly, no splenomegaly Musculoskeletal: nontender, no swelling, left 2nd and 3rd digits with distal phlange amputated (remotely, well healed) Extremities: no edema, no cyanosis, no clubbing Pulses: 2+ symmetric, upper and lower extremities, normal cap refill Neurological: alert, oriented x 3, CN2-12 intact, strength normal upper extremities and lower extremities, sensation normal throughout, DTRs 2+ throughout, no cerebellar signs, gait normal Psychiatric: normal affect, behavior normal, pleasant   Adela Glimpse, NP 10:57 AM [facility]

## 2023-03-20 DIAGNOSIS — J449 Chronic obstructive pulmonary disease, unspecified: Secondary | ICD-10-CM | POA: Diagnosis not present

## 2023-04-13 ENCOUNTER — Encounter: Payer: Self-pay | Admitting: Cardiovascular Disease

## 2023-04-13 ENCOUNTER — Ambulatory Visit: Payer: Medicare PPO | Attending: Cardiovascular Disease | Admitting: Cardiovascular Disease

## 2023-04-13 VITALS — BP 126/60 | HR 62 | Ht 71.0 in | Wt 149.0 lb

## 2023-04-13 DIAGNOSIS — I480 Paroxysmal atrial fibrillation: Secondary | ICD-10-CM

## 2023-04-13 DIAGNOSIS — Z720 Tobacco use: Secondary | ICD-10-CM | POA: Diagnosis not present

## 2023-04-13 DIAGNOSIS — I252 Old myocardial infarction: Secondary | ICD-10-CM | POA: Diagnosis not present

## 2023-04-13 DIAGNOSIS — J449 Chronic obstructive pulmonary disease, unspecified: Secondary | ICD-10-CM

## 2023-04-13 DIAGNOSIS — D6869 Other thrombophilia: Secondary | ICD-10-CM | POA: Diagnosis not present

## 2023-04-13 DIAGNOSIS — I4891 Unspecified atrial fibrillation: Secondary | ICD-10-CM

## 2023-04-13 DIAGNOSIS — I2510 Atherosclerotic heart disease of native coronary artery without angina pectoris: Secondary | ICD-10-CM | POA: Diagnosis not present

## 2023-04-13 DIAGNOSIS — I10 Essential (primary) hypertension: Secondary | ICD-10-CM

## 2023-04-13 DIAGNOSIS — E782 Mixed hyperlipidemia: Secondary | ICD-10-CM

## 2023-04-13 NOTE — Progress Notes (Signed)
Cardiology Office Note    Date:  04/22/2023   ID:  Michael [name], DOB February 25, 1951, MRN [id number]  PCP:  Lucky Cowboy, MD  Cardiologist:  Nicki Guadalajara, MD   New cardiology evaluation referred by Adela Glimpse, NP at [facility] adult and adolescent internal medicine.   History of Present Illness:  Michael [name] is a 72 y.o. male who sees Dr. Eartha Inch for primary care.  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  He is now referred to reestablish care.  Michael [name] has a history of CAD and hypertension.  He suffered a myocardial infarction in 2005 and catheterization reportedly demonstrated a small branch occlusion of the circumflex vessel.  Due to the small caliber, medical therapy was recommended.  Following his MI he had quit tobacco use for 6 months but subsequently resumed.  He had initially started smoking at age 87.  Of note, in 2018 laboratory showed significant hyperlipidemia with total cholesterol 226, LDL cholesterol 136 and triglycerides 218.  At the time he was taking rosuvastatin 2 times per week and I recommended titration to at least every other day and added Zetia 10 mg.  Nuclear perfusion study done on September 07, 2016 showed normal EF at 59% without wall motion abnormalities.  The study was a low risk and was without ischemic perfusion changes.  I have not seen Michael [name] since 2018.  Apparently, he recently developed community-acquired pneumonia of the right upper lobe of his lung and was hospitalized for 8 days in September in [place] at [facility].  Following initiation of albuterol, he developed atrial fibrillation for 30 minutes and then he had restoration of sinus rhythm.  He was treated with supplemental oxygen.  He was started on Eliquis anticoagulation.  He quit smoking after his pneumonia.  He has COPD and is followed by Dr. Tonia Brooms of pulmonary.  He recently saw Adela Glimpse, NP  and Dr. Kathryne Sharper office and is now referred for cardiology reevaluation and assessment.   Past Medical History:  Diagnosis Date   Arthritis    right wrist   BPH (benign prostatic hyperplasia)    COPD (chronic obstructive pulmonary disease) (HCC)    via CT lung   Dyspnea    GERD (gastroesophageal reflux disease)    yrs ago, no current problems, no meds as of 07/23/22   Hyperlipidemia    Hypertension    Hypogonadism male    Myocardial infarction [facility]) 2005   Per patient no stent or PTCA, just medical treatment   Pneumonia    x 1   Prediabetes    Vitamin D deficiency     Past Surgical History:  Procedure Laterality Date   AMPUTATION FINGER / THUMB Left 1991   distal index   BRONCHIAL BIOPSY  07/27/2022   Procedure: BRONCHIAL BIOPSIES;  Surgeon: Josephine Igo, DO;  Location: [facility];  Service: Pulmonary;;   BRONCHIAL BRUSHINGS  07/27/2022   Procedure: BRONCHIAL BRUSHINGS;  Surgeon: Josephine Igo, DO;  Location: [facility];  Service: Pulmonary;;   BRONCHIAL NEEDLE ASPIRATION BIOPSY  07/27/2022   Procedure: BRONCHIAL NEEDLE ASPIRATION BIOPSIES;  Surgeon: Josephine Igo, DO;  Location: [facility];  Service: Pulmonary;;   CARDIAC CATHETERIZATION N/A 2005   Per patient no stent or PTCA, just medical treatment   CATARACT EXTRACTION, BILATERAL Bilateral 2014   Dr. Vonna Kotyk    colonoscopy N/A 10/2012   Neg and recc  f/u Cololnoscopy in 5 yeary - Dr Lorin Picket O/neill - Gastroenterologist   FIDUCIAL MARKER PLACEMENT  07/27/2022   Procedure: FIDUCIAL MARKER PLACEMENT;  Surgeon: Josephine Igo, DO;  Location: [facility];  Service: Pulmonary;;   PROSTATE SURGERY     Biopsy X 3   VASECTOMY     VIDEO BRONCHOSCOPY WITH ENDOBRONCHIAL ULTRASOUND Left 07/27/2022   Procedure: VIDEO BRONCHOSCOPY WITH ENDOBRONCHIAL ULTRASOUND;  Surgeon: Josephine Igo, DO;  Location: [facility];  Service: Pulmonary;  Laterality: Left;    Current Medications: Outpatient Medications Prior to Visit   Medication Sig Dispense Refill   albuterol (VENTOLIN HFA) 108 (90 Base) MCG/ACT inhaler Inhale 1-2 puffs into the lungs every 6 (six) hours as needed for wheezing or shortness of breath. 54 g 1   apixaban (ELIQUIS) 5 MG TABS tablet Take 5 mg by mouth 2 (two) times daily.     B Complex-C (SUPER B COMPLEX PO) Take 1 tablet by mouth daily.     bisoprolol-hydrochlorothiazide (ZIAC) 10-6.25 MG tablet Take 1 tablet every Morning for BP 90 tablet 3   buPROPion (WELLBUTRIN XL) 300 MG 24 hr tablet Take 1 tablet Daily for Mood, Focus & Concentration. 90 tablet 3   Cholecalciferol (VITAMIN D) 50 MCG (2000 UT) CAPS 4,000 Units daily.     Fluticasone-Umeclidin-Vilant (TRELEGY ELLIPTA) 100-62.5-25 MCG/ACT AEPB INHALE 1 PUFF DAILY FOR COPD / ASTHMA 180 each 3   furosemide (LASIX) 40 MG tablet Take 40 mg by mouth as needed.     Guaifenesin 1200 MG TB12 Take 1,200 mg by mouth 2 (two) times daily.     Magnesium 250 MG TABS Take 250 mg by mouth daily as needed (Constipation).     Multiple Vitamin (MULTIVITAMIN) tablet Take 1 tablet by mouth daily. Takes a vitamin pack daily     rosuvastatin (CRESTOR) 20 MG tablet Take  1 tablet  Daily  for Cholesterol                                       /                                                                   TAKE                                         BY                                                 MOUTH 90 tablet 3   diltiazem (CARDIZEM CD) 240 MG 24 hr capsule Take 240 mg by mouth daily. (Patient not taking: Reported on 04/13/2023)     No facility-administered medications prior to visit.     Allergies:   Ace inhibitors, Ciprofloxacin, and Fenofibrate   Social History   Socioeconomic History   Marital status: Divorced    Spouse name: Not on file   Number of children: Not on file  Years of education: Not on file   Highest education level: Not on file  Occupational History   Not on file  Tobacco Use   Smoking status: Every Day    Current packs/day:  1.00    Average packs/day: 1 pack/day for 59.9 years (59.9 ttl pk-yrs)    Types: Cigarettes    Start date: 1965   Smokeless tobacco: Never   Tobacco comments:    Smokes a pack a cigarettes a day. 02/03/2023 Tay  Vaping Use   Vaping status: Never Used  Substance and Sexual Activity   Alcohol use: Not Currently    Alcohol/week: 5.0 standard drinks of alcohol    Types: 5 Cans of beer per week    Comment: occasional   Drug use: No   Sexual activity: Not on file  Other Topics Concern   Not on file  Social History Narrative   Epworth Sleepiness score 5    Social Determinants of Health   Financial Resource Strain: Low Risk  (02/11/2023)   Received from [facility]   Overall Financial Resource Strain (CARDIA)    Difficulty of Paying Living Expenses: Not hard at all  Food Insecurity: No Food Insecurity (02/11/2023)   Received from [facility]   Hunger Vital Sign    Worried About Running Out of Food in the Last Year: Never true    Ran Out of Food in the Last Year: Never true  Transportation Needs: No Transportation Needs (02/11/2023)   Received from [facility] - Transportation    Lack of Transportation (Medical): No    Lack of Transportation (Non-Medical): No  Physical Activity: Insufficiently Active (02/11/2023)   Received from [facility]   Exercise Vital Sign    Days of Exercise per Week: 3 days    Minutes of Exercise per Session: 30 min  Stress: Stress Concern Present (02/11/2023)   Received from [facility] - Occupational Stress Questionnaire    Feeling of Stress : To some extent  Social Connections: Unknown (02/11/2023)   Received from [facility]   Social Connection and Isolation Panel [NHANES]    Frequency of Communication with Friends and Family: More than three times a week    Frequency of Social Gatherings with Friends and Family: Twice a week    Attends Religious Services: Never    Automotive engineer or Organizations: No    Attends Banker Meetings: Never    Marital Status: Patient declined    Socially, he is separated from his wife.  He has 4 children and 3 grandchildren and 1 great grandchild.  He is the custodian of a 69 year old acoustic ran son.  His older son is incarcerated since January 2023.  He has a longstanding tobacco history starting at age 89 and quit following his pneumonia.   Family History:  The patient's family history includes Cancer in his maternal aunt, maternal uncle, and maternal uncle; Cancer (age of onset: 48) in his father; Heart failure in his maternal grandfather; Liver disease in his paternal grandfather; Other in his son; Pneumonia in his paternal grandmother; Stroke in his maternal grandmother; Stroke (age of onset: 38) in his son.  Both parents are deceased.  Mother had hypertension and died of natural causes at age 77.  Father died at age 49 with lymphoma.  He has 4 living brothers with age range 86, 55, 48 and 44.  1 son died with a stroke and massive  intestinal bleed  ROS General: Negative; No fevers, chills, or night sweats;  HEENT: Negative; No changes in vision or hearing, sinus congestion, difficulty swallowing Pulmonary: COPD followed by Dr. Tonia Brooms Cardiovascular: See HPI GI: Negative; No nausea, vomiting, diarrhea, or abdominal pain GU: Negative; No dysuria, hematuria, or difficulty voiding Musculoskeletal: Negative; no myalgias, joint pain, or weakness Hematologic/Oncology: Negative; no easy bruising, bleeding Endocrine: Negative; no heat/cold intolerance; no diabetes Neuro: Negative; no changes in balance, headaches Skin: Negative; No rashes or skin lesions Psychiatric: Negative; No behavioral problems, depression Sleep: Negative; No snoring, daytime sleepiness, hypersomnolence, bruxism, restless legs, hypnogognic hallucinations, no cataplexy Other comprehensive 14 point system review is negative.   PHYSICAL EXAM:    VS:  BP 126/60   Pulse 62   Ht 5\' 11"  (1.803 m)   Wt 149 lb (67.6 kg)   SpO2 98%   BMI 20.78 kg/m     Repeat blood pressure by me was 124/66  Wt Readings from Last 3 Encounters:  04/13/23 149 lb (67.6 kg)  03/16/23 149 lb 3.2 oz (67.7 kg)  02/23/23 158 lb 12.8 oz (72 kg)    General: Alert, oriented, no distress.  Skin: normal turgor, no rashes, warm and dry HEENT: Normocephalic, atraumatic. Pupils equal round and reactive to light; sclera anicteric; extraocular muscles intact;  Nose without nasal septal hypertrophy Mouth/Parynx benign; Mallinpatti scale 3 Neck: No JVD, no carotid bruits; normal carotid upstroke Lungs: Decreased breath sounds; no wheezing or rales Chest wall: without tenderness to palpitation Heart: PMI not displaced, RRR, s1 s2 normal, 1/6 systolic murmur, no diastolic murmur, no rubs, gallops, thrills, or heaves Abdomen: soft, nontender; no hepatosplenomehaly, BS+; abdominal aorta nontender and not dilated by palpation. Back: no CVA tenderness Pulses 2+ Musculoskeletal: full range of motion, normal strength, no joint deformities Extremities: no clubbing cyanosis or edema, Homan's sign negative  Neurologic: grossly nonfocal; Cranial nerves grossly wnl Psychologic: Normal mood and affect   Studies/Labs Reviewed:   EKG Interpretation Date/Time:  Wednesday April 13 2023 13:53:15 EST Ventricular Rate:  62 PR Interval:  160 QRS Duration:  86 QT Interval:  408 QTC Calculation: 414 R Axis:   74  Text Interpretation: Normal sinus rhythm Normal ECG When compared with ECG of 14-May-2004 10:46, No significant change was found Confirmed by Nicki Guadalajara ([id number]) on 04/13/2023 3:00:04 PM    Recent Labs:    Latest Ref Rng & Units 02/23/2023   10:47 AM 12/02/2022   10:34 AM 07/27/2022   11:10 AM  BMP  Glucose 65 - 99 mg/dL 77  573  92   BUN 7 - 25 mg/dL 17  19  14    Creatinine 0.70 - 1.28 mg/dL 2.20  2.54  2.70   BUN/Creat Ratio 6 - 22 (calc) SEE NOTE:  SEE  NOTE:    Sodium 135 - 146 mmol/L 141  140  137   Potassium 3.5 - 5.3 mmol/L 4.4  4.1  4.0   Chloride 98 - 110 mmol/L 100  106  104   CO2 20 - 32 mmol/L 31  28  24    Calcium 8.6 - 10.3 mg/dL 8.5  9.0  9.1         Latest Ref Rng & Units 02/23/2023   10:47 AM 12/02/2022   10:34 AM 05/18/2022   12:00 AM  Hepatic Function  Total Protein 6.1 - 8.1 g/dL 5.5  6.0  6.3   AST 10 - 35 U/L 17  17  18    ALT 9 - 46  U/L 17  13  15    Total Bilirubin 0.2 - 1.2 mg/dL 1.0  1.0  0.6        Latest Ref Rng & Units 02/23/2023   10:47 AM 12/02/2022   10:34 AM 07/27/2022   11:10 AM  CBC  WBC 3.8 - 10.8 Thousand/uL 9.6  8.4  8.0   Hemoglobin 13.2 - 17.1 g/dL 62.1  30.8  65.7   Hematocrit 38.5 - 50.0 % 37.1  39.9  43.4   Platelets 140 - 400 Thousand/uL 451  144  136    Lab Results  Component Value Date   MCV 97.6 02/23/2023   MCV 93.9 12/02/2022   MCV 95.2 07/27/2022   Lab Results  Component Value Date   TSH 1.59 12/02/2022   Lab Results  Component Value Date   HGBA1C 5.8 (H) 12/02/2022     BNP No results found for: "BNP"  ProBNP No results found for: "PROBNP"   Lipid Panel     Component Value Date/Time   CHOL 232 (H) 12/02/2022 1034   TRIG 256 (H) 12/02/2022 1034   HDL 51 12/02/2022 1034   CHOLHDL 4.5 12/02/2022 1034   VLDL NOT CALC 11/08/2016 0854   LDLCALC 142 (H) 12/02/2022 1034     RADIOLOGY: No results found.   Additional studies/ records that were reviewed today include:  I reviewed records from Adela Glimpse, NP    ASSESSMENT:    1. Essential hypertension   2. Coronary artery disease involving native coronary artery of native heart without angina pectoris   3. History of MI (myocardial infarction): 2005 , branch of LCx, treated medically   4. Paroxysmal atrial fibrillation (HCC)   5. COPD, severe (HCC)   6. Hypercoagulable state due to paroxysmal atrial fibrillation (HCC)   7. Tobacco use: Started age 28; recent cessation September 2024      PLAN:  Mr.  Kami [name] is a 72 year old gentleman who has known CAD and remotely had suffered a small MI secondary to occlusion of a small branch of his circumflex vessel for which medical therapy was recommended.  He has extensive tobacco history having started smoking at age 89.  In 2018, a DOT physical nuclear stress test was low risk and showed no scar or ischemia.  He was recently hospitalized in [facility] with pneumonia and developed atrial fibrillation after albuterol treatment.  Apparently atrial fibrillation lasted for approximately 30 minutes.  She was started on anticoagulation.  Presently, he is on a blood pressure regimen of bisoprolol 5 mg and diltiazem CD2 140 mg.  He has a prescription for furosemide to take as needed.  He is anticoagulated on Eliquis 5 mg twice a day.  He takes rosuvastatin 20 mg for hyperlipidemia and is on Trelegy Ellipta for COPD followed by Dr. Tempie Hoist.  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  He had normal flow in the vertebrals and subclavian's.  Presently, I am recommending he undergo an echo Doppler study for reassessment of LV systolic and diastolic function.  I am also recommending laboratory with a comprehensive metabolic panel, fasting lipid panel, and will check LP(a).  Since he is does not have a stent in place, I am scheduling him to undergo coronary calcium scoring for assessment of coronary calcification.  We discussed the absolute importance of smoking cessation.  I reviewed  laboratory from July 2024 which showed total cholesterol 232, HDL 51, triglycerides 256 and LDL 142.  Laboratory from October 2 was reviewed.  I have also recommended pulmonary reevaluation with Dr. Tonia Brooms.  I will contact him regarding the above studies.  I plan to see him in 3 to 4 months for follow-up evaluation or sooner as needed.  October  2024   Medication Adjustments/Labs and Tests Ordered: Current medicines are reviewed at length with the patient today.  Concerns regarding medicines are outlined above.  Medication changes, Labs and Tests ordered today are listed in the Patient Instructions below. Patient Instructions  Medication Instructions:  No medication changes at this time. The doctor will discuss medication adjustments with your pending your lab.    *If you need a refill on your cardiac medications before your next appointment, please call your pharmacy*   Lab Work: CMET, LIPID, LPa If you have labs (blood work) drawn today and your tests are completely normal, you will receive your results only by: MyChart Message (if you have MyChart) OR A paper copy in the mail If you have any lab test that is abnormal or we need to change your treatment, we will call you to review the results.  .   Testing/Procedures: Your physician has requested that you have an echocardiogram. Echocardiography is a painless test that uses sound waves to create images of your heart. It provides your doctor with information about the size and shape of your heart and how well your heart's chambers and valves are working. This procedure takes approximately one hour. There are no restrictions for this procedure. Please do NOT wear cologne, perfume, aftershave, or lotions (deodorant is allowed).  Please arrive 15 minutes prior to your appointment time.  Please note: We ask at that you not bring children with you during ultrasound (echo/ vascular) testing. Due to room size and safety concerns, children are not allowed in the ultrasound rooms during exams. Our front office staff cannot provide observation of children in our lobby area while testing is being conducted. An adult accompanying a patient to their appointment will only be allowed in the ultrasound room at the discretion of the ultrasound technician under special circumstances. We  apologize for any inconvenience.    Follow-Up: At [facility], you and your health needs are our priority.  As part of our continuing mission to provide you with exceptional heart care, we have created designated Provider Care Teams.  These Care Teams include your primary Cardiologist (physician) and Advanced Practice Providers (APPs -  Physician Assistants and Nurse Practitioners) who all work together to provide you with the care you need, when you need it.  We recommend signing up for the patient portal called "MyChart".  Sign up information is provided on this After Visit Summary.  MyChart is used to connect with patients for Virtual Visits (Telemedicine).  Patients are able to view lab/test results, encounter notes, upcoming appointments, etc.  Non-urgent messages can be sent to your provider as well.   To learn more about what you can do with MyChart, go to [email or web address].    Your next appointment:   4 month(s)  Provider:     Our in office lab hours are Monday-Friday 8:00-4:00, closed for lunch 12:45-1:45 pm.  No appointment needed.  [facility] locations:   [facility] - [address]  - [address] ([facility]) - [address] [phone] [address]  [place] - [address]    [place]  - [address]    [place] - [address] - [address] [employer]  - [address] ([facility])    Signed, Nicki Guadalajara, MD  04/22/2023 6:22 PM    [facility] [address], [address], [place], [place]  [address] Phone: [phone]

## 2023-04-13 NOTE — Patient Instructions (Addendum)
Medication Instructions:  No medication changes at this time. The doctor will discuss medication adjustments with your pending your lab.    *If you need a refill on your cardiac medications before your next appointment, please call your pharmacy*   Lab Work: CMET, LIPID, LPa If you have labs (blood work) drawn today and your tests are completely normal, you will receive your results only by: MyChart Message (if you have MyChart) OR A paper copy in the mail If you have any lab test that is abnormal or we need to change your treatment, we will call you to review the results.  .   Testing/Procedures: Your physician has requested that you have an echocardiogram. Echocardiography is a painless test that uses sound waves to create images of your heart. It provides your doctor with information about the size and shape of your heart and how well your heart's chambers and valves are working. This procedure takes approximately one hour. There are no restrictions for this procedure. Please do NOT wear cologne, perfume, aftershave, or lotions (deodorant is allowed).  Please arrive 15 minutes prior to your appointment time.  Please note: We ask at that you not bring children with you during ultrasound (echo/ vascular) testing. Due to room size and safety concerns, children are not allowed in the ultrasound rooms during exams. Our front office staff cannot provide observation of children in our lobby area while testing is being conducted. An adult accompanying a patient to their appointment will only be allowed in the ultrasound room at the discretion of the ultrasound technician under special circumstances. We apologize for any inconvenience.    Follow-Up: At [facility], you and your health needs are our priority.  As part of our continuing mission to provide you with exceptional heart care, we have created designated Provider Care Teams.  These Care Teams include your primary Cardiologist  (physician) and Advanced Practice Providers (APPs -  Physician Assistants and Nurse Practitioners) who all work together to provide you with the care you need, when you need it.  We recommend signing up for the patient portal called "MyChart".  Sign up information is provided on this After Visit Summary.  MyChart is used to connect with patients for Virtual Visits (Telemedicine).  Patients are able to view lab/test results, encounter notes, upcoming appointments, etc.  Non-urgent messages can be sent to your provider as well.   To learn more about what you can do with MyChart, go to [email or web address].    Your next appointment:   4 month(s)  Provider:     Our in office lab hours are Monday-Friday 8:00-4:00, closed for lunch 12:45-1:45 pm.  No appointment needed.  [facility] locations:   [facility] - [address]  - [address] ([facility]) - [address] [phone] [address]   [place] - [address]    [place]  - [address]    [place] - [address] - [address] [employer]  - [address] [place] - [address] ([facility])

## 2023-04-20 DIAGNOSIS — J449 Chronic obstructive pulmonary disease, unspecified: Secondary | ICD-10-CM | POA: Diagnosis not present

## 2023-04-22 ENCOUNTER — Encounter: Payer: Self-pay | Admitting: Cardiovascular Disease

## 2023-04-22 DIAGNOSIS — J449 Chronic obstructive pulmonary disease, unspecified: Secondary | ICD-10-CM | POA: Diagnosis not present

## 2023-05-05 DIAGNOSIS — I2510 Atherosclerotic heart disease of native coronary artery without angina pectoris: Secondary | ICD-10-CM | POA: Diagnosis not present

## 2023-05-05 DIAGNOSIS — Z720 Tobacco use: Secondary | ICD-10-CM | POA: Diagnosis not present

## 2023-05-05 DIAGNOSIS — I10 Essential (primary) hypertension: Secondary | ICD-10-CM | POA: Diagnosis not present

## 2023-05-05 DIAGNOSIS — I4891 Unspecified atrial fibrillation: Secondary | ICD-10-CM | POA: Diagnosis not present

## 2023-05-07 LAB — COMPREHENSIVE METABOLIC PANEL
ALT: 15 [IU]/L (ref 0–44)
AST: 20 [IU]/L (ref 0–40)
Albumin: 4.4 g/dL (ref 3.8–4.8)
Alkaline Phosphatase: 102 [IU]/L (ref 44–121)
BUN/Creatinine Ratio: 15 (ref 10–24)
BUN: 14 mg/dL (ref 8–27)
Bilirubin Total: 0.4 mg/dL (ref 0.0–1.2)
CO2: 26 mmol/L (ref 20–29)
Calcium: 9.4 mg/dL (ref 8.6–10.2)
Chloride: 102 mmol/L (ref 96–106)
Creatinine, Ser: 0.93 mg/dL (ref 0.76–1.27)
Globulin, Total: 2.3 g/dL (ref 1.5–4.5)
Glucose: 81 mg/dL (ref 70–99)
Potassium: 4.9 mmol/L (ref 3.5–5.2)
Sodium: 141 mmol/L (ref 134–144)
Total Protein: 6.7 g/dL (ref 6.0–8.5)
eGFR: 87 mL/min/{1.73_m2} (ref >59)

## 2023-05-07 LAB — LIPID PANEL
Chol/HDL Ratio: 3.1 {ratio} (ref 0.0–5.0)
Cholesterol, Total: 169 mg/dL (ref 100–199)
HDL: 55 mg/dL (ref >39)
LDL Chol Calc (NIH): 85 mg/dL (ref 0–99)
Triglycerides: 170 mg/dL — ABNORMAL HIGH (ref 0–149)
VLDL Cholesterol Cal: 29 mg/dL (ref 5–40)

## 2023-05-07 LAB — LIPOPROTEIN A (LPA): Lipoprotein (a): 45.4 nmol/L (ref <75.0)

## 2023-05-17 NOTE — Addendum Note (Signed)
Addended by: Shon Hale on: 05/17/2023 09:16 AM   Modules accepted: Orders

## 2023-05-17 NOTE — Progress Notes (Signed)
Called pt of labs results. Pt is aware that he is to have him labs redrawn in 4 months. He as also agreed to increase the statin to 40 mg daily. Orders have been placed for repeat lipids and cmet.

## 2023-05-20 ENCOUNTER — Encounter: Payer: Medicare PPO | Admitting: Internal Medicine

## 2023-05-20 DIAGNOSIS — J449 Chronic obstructive pulmonary disease, unspecified: Secondary | ICD-10-CM | POA: Diagnosis not present

## 2023-05-22 DIAGNOSIS — J449 Chronic obstructive pulmonary disease, unspecified: Secondary | ICD-10-CM | POA: Diagnosis not present

## 2023-05-24 ENCOUNTER — Ambulatory Visit (HOSPITAL_COMMUNITY): Payer: Medicare PPO | Attending: Cardiovascular Disease

## 2023-05-24 DIAGNOSIS — Z720 Tobacco use: Secondary | ICD-10-CM | POA: Diagnosis not present

## 2023-05-24 DIAGNOSIS — I480 Paroxysmal atrial fibrillation: Secondary | ICD-10-CM | POA: Insufficient documentation

## 2023-05-24 LAB — ECHOCARDIOGRAM COMPLETE: S' Lateral: 2.83 cm

## 2023-06-09 ENCOUNTER — Encounter: Payer: Medicare PPO | Admitting: Internal Medicine

## 2023-06-20 DIAGNOSIS — J449 Chronic obstructive pulmonary disease, unspecified: Secondary | ICD-10-CM | POA: Diagnosis not present

## 2023-06-22 DIAGNOSIS — J449 Chronic obstructive pulmonary disease, unspecified: Secondary | ICD-10-CM | POA: Diagnosis not present

## 2023-07-03 IMAGING — CT CT CHEST LCS NODULE FOLLOW-UP W/O CM
1 series · 15 of 33 positions shown, 19 images · non-contrast
Comparison: 10/27/2020

CLINICAL DATA: Current smoker 39 pack-year history.  Asymptomatic.

EXAM:
CT CHEST WITHOUT CONTRAST FOR LUNG CANCER SCREENING NODULE FOLLOW-UP
TECHNIQUE: Multidetector CT imaging of the chest was performed following the
standard protocol without IV contrast.

[Series 2: lcs nodule f/u · axial · 0.65mm/px · z∈[-306,-32]mm · 15 of 65 slices shown, 19 images]
[im 5/65  mediastinal]
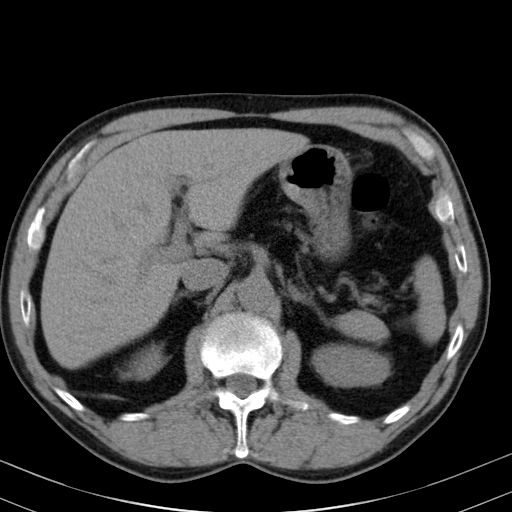
[im 5/65  lung]
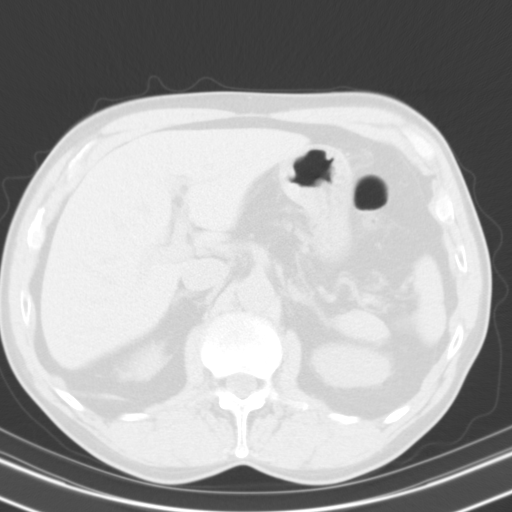
[im 10/65  lung]
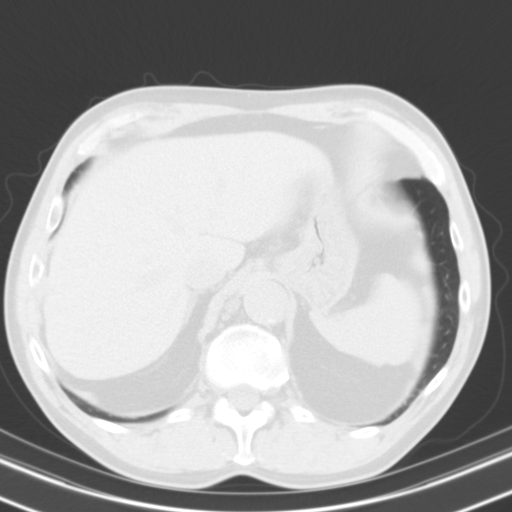
[im 13/65  lung]
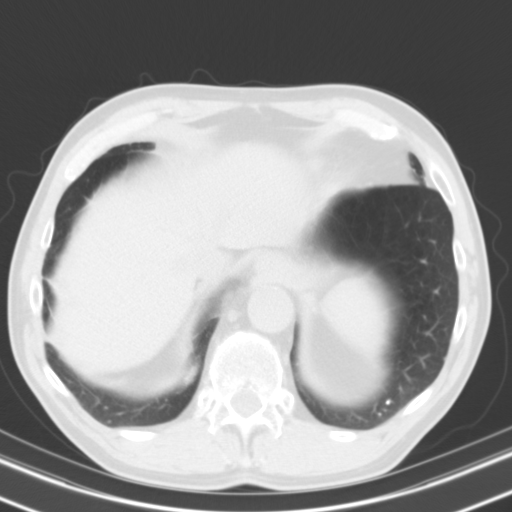
[im 17/65  lung]
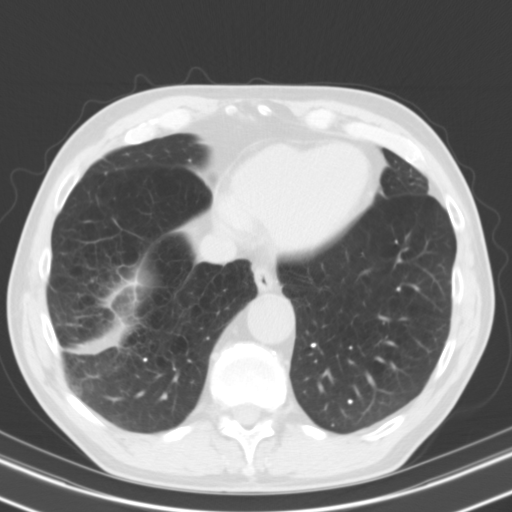
[im 22/65  mediastinal]
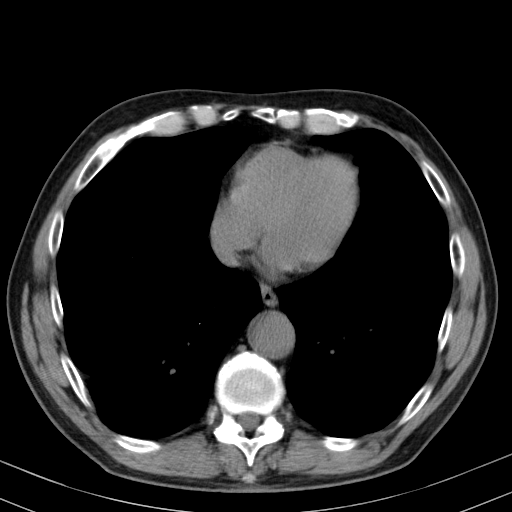
[im 22/65  lung]
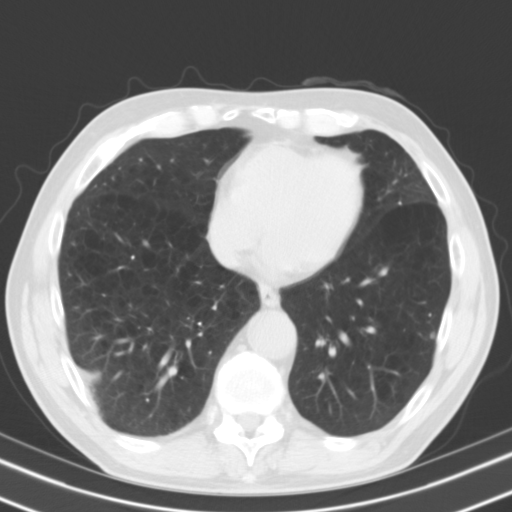
[im 26/65  lung]
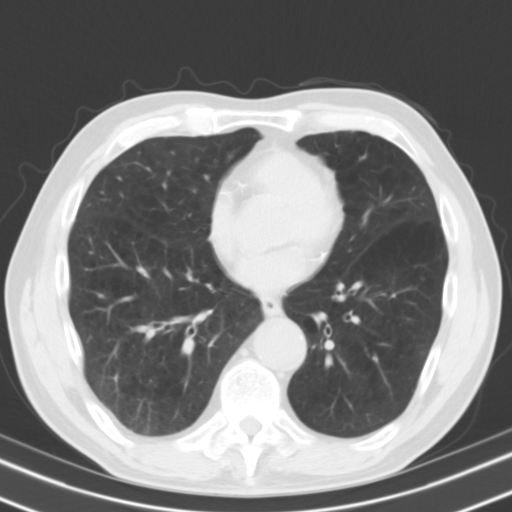
[im 29/65  lung]
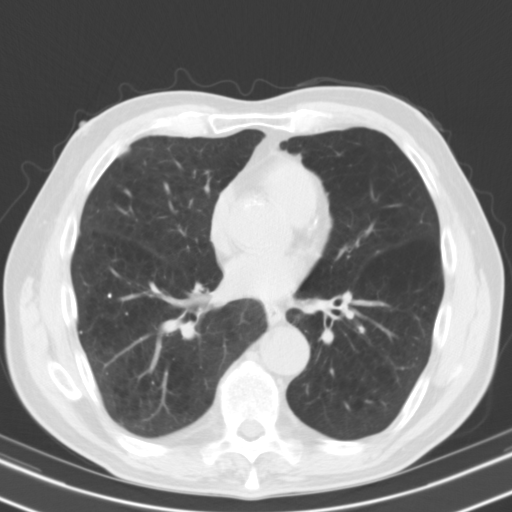
[im 34/65  lung]
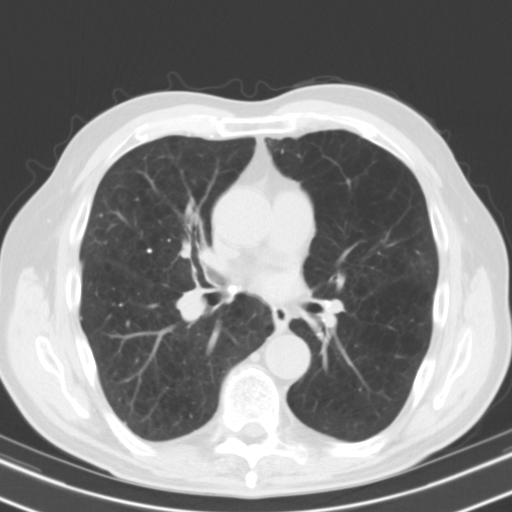
[im 36/65  mediastinal]
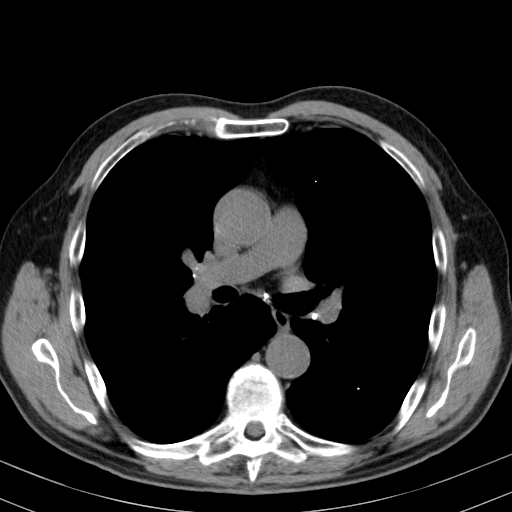
[im 36/65  lung]
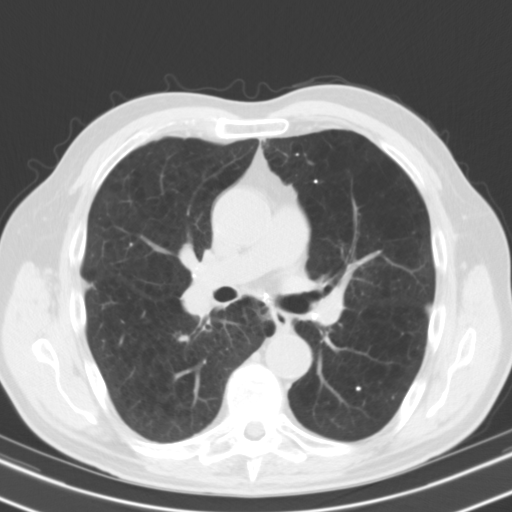
[im 39/65  lung]
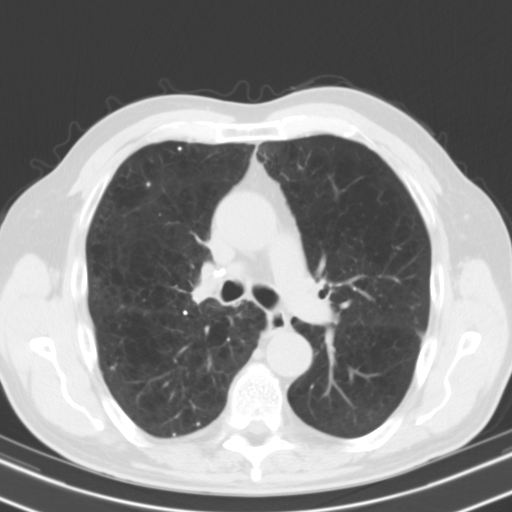
[im 43/65  lung]
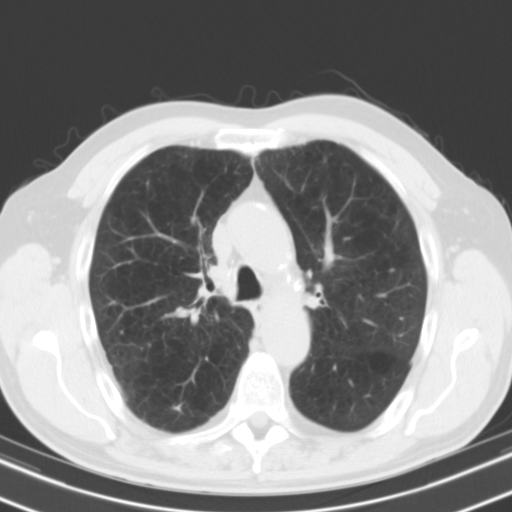
[im 48/65  lung]
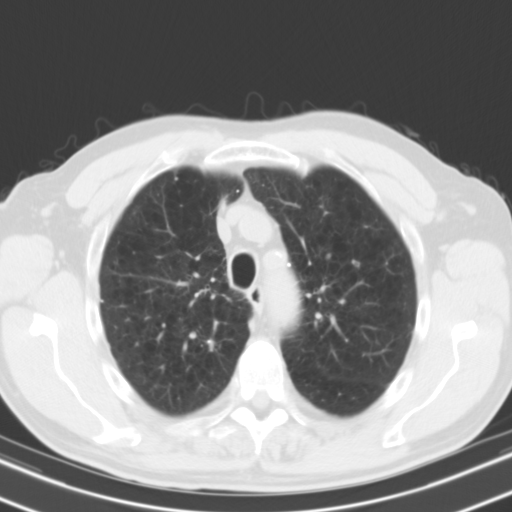
[im 52/65  mediastinal]
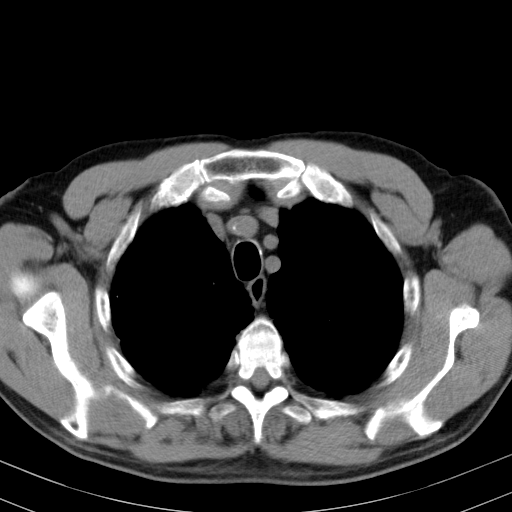
[im 52/65  lung]
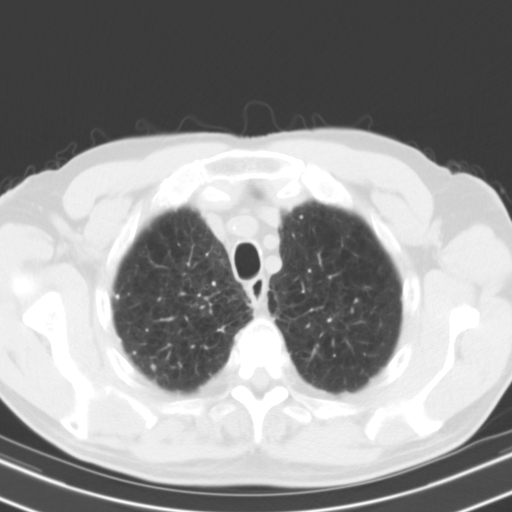
[im 55/65  lung]
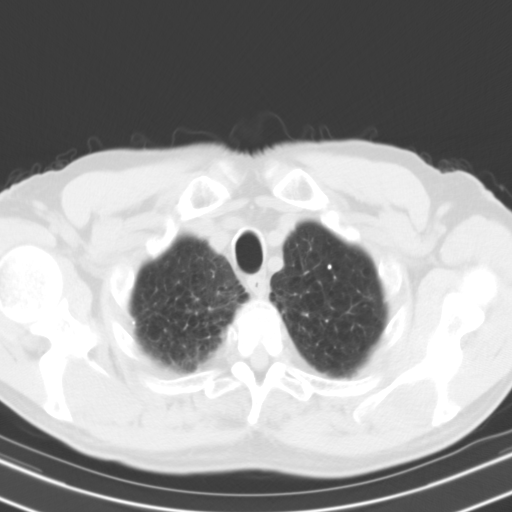
[im 60/65  lung]
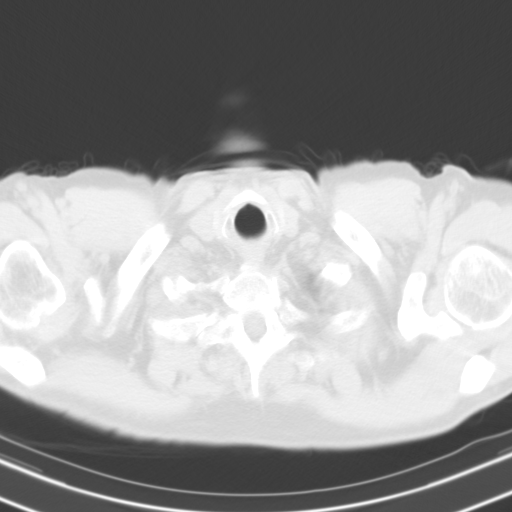

[15 of 33 positions shown; findings below may reference images not displayed]

FINDINGS: Cardiovascular: Normal heart size. No pericardial effusion. Aortic
atherosclerosis and coronary artery calcifications.

Mediastinum/Nodes: No enlarged mediastinal, hilar, or axillary lymph
nodes. Thyroid gland, trachea, and esophagus demonstrate no
significant findings. Calcified mediastinal and hilar lymph nodes
compatible with remote granulomatous disease.

Lungs/Pleura: Moderate to advanced changes of paraseptal and
centrilobular emphysema. Innumerable calcified and noncalcified lung
nodules appear unchanged from previous exam. These measure up to
mm. No suspicious lung nodules identified at this time.

Upper Abdomen: No acute abnormality.

Musculoskeletal: Mild spondylosis identified within the midthoracic
spine. No acute or suspicious osseous findings.
IMPRESSION: 1. Lung-RADS 2, benign appearance or behavior. Continue annual
screening with low-dose chest CT without contrast in 12 months.
2. Emphysema and aortic atherosclerosis.

Aortic Atherosclerosis ([id number].[id number]) and Emphysema ([id number].N).

## 2023-07-07 ENCOUNTER — Encounter: Payer: Medicare PPO | Admitting: Internal Medicine

## 2023-08-31 ENCOUNTER — Ambulatory Visit: Payer: Medicare PPO | Admitting: Nurse Practitioner

## 2023-09-05 ENCOUNTER — Other Ambulatory Visit: Payer: Medicare PPO

## 2023-09-14 ENCOUNTER — Ambulatory Visit: Payer: Medicare PPO | Admitting: Urology

## 2023-10-11 ENCOUNTER — Ambulatory Visit: Payer: Medicare PPO | Admitting: Nurse Practitioner

## 2023-11-29 ENCOUNTER — Encounter: Payer: Self-pay | Admitting: Emergency Medicine

## 2024-05-16 ENCOUNTER — Other Ambulatory Visit: Payer: Self-pay | Admitting: Nurse Practitioner

## 2024-05-16 DIAGNOSIS — J449 Chronic obstructive pulmonary disease, unspecified: Secondary | ICD-10-CM
